# Patient Record
Sex: Female | Born: 1955
Health system: Southern US, Community
[De-identification: ages and names within clinical notes are randomized; demographics above are authoritative.]

## PROBLEM LIST (undated history)

## (undated) DIAGNOSIS — F419 Anxiety disorder, unspecified: Secondary | ICD-10-CM

## (undated) DIAGNOSIS — F32A Depression, unspecified: Secondary | ICD-10-CM

## (undated) DIAGNOSIS — G4730 Sleep apnea, unspecified: Secondary | ICD-10-CM

## (undated) DIAGNOSIS — I10 Essential (primary) hypertension: Secondary | ICD-10-CM

## (undated) DIAGNOSIS — E785 Hyperlipidemia, unspecified: Secondary | ICD-10-CM

## (undated) HISTORY — PX: APPENDECTOMY: SHX54

## (undated) HISTORY — DX: Anxiety disorder, unspecified: F41.9

## (undated) HISTORY — DX: Hyperlipidemia, unspecified: E78.5

## (undated) HISTORY — DX: Essential (primary) hypertension: I10

## (undated) HISTORY — PX: OTHER SURGICAL HISTORY: SHX169

## (undated) HISTORY — DX: Depression, unspecified: F32.A

## (undated) HISTORY — DX: Sleep apnea, unspecified: G47.30

---

## 2004-06-08 ENCOUNTER — Encounter: Admission: RE | Admit: 2004-06-08 | Discharge: 2004-06-08 | Payer: Self-pay | Admitting: *Deleted

## 2004-06-15 ENCOUNTER — Encounter: Admission: RE | Admit: 2004-06-15 | Discharge: 2004-06-15 | Payer: Self-pay | Admitting: *Deleted

## 2005-01-12 ENCOUNTER — Encounter: Admission: RE | Admit: 2005-01-12 | Discharge: 2005-01-12 | Payer: Self-pay | Admitting: *Deleted

## 2005-05-12 ENCOUNTER — Encounter: Admission: RE | Admit: 2005-05-12 | Discharge: 2005-05-12 | Payer: Self-pay | Admitting: *Deleted

## 2006-05-17 ENCOUNTER — Encounter: Admission: RE | Admit: 2006-05-17 | Discharge: 2006-05-17 | Payer: Self-pay | Admitting: *Deleted

## 2006-05-19 ENCOUNTER — Encounter: Admission: RE | Admit: 2006-05-19 | Discharge: 2006-05-19 | Payer: Self-pay | Admitting: *Deleted

## 2007-05-31 ENCOUNTER — Encounter: Admission: RE | Admit: 2007-05-31 | Discharge: 2007-05-31 | Payer: Self-pay | Admitting: *Deleted

## 2008-06-06 ENCOUNTER — Encounter: Admission: RE | Admit: 2008-06-06 | Discharge: 2008-06-06 | Payer: Self-pay | Admitting: *Deleted

## 2009-06-09 ENCOUNTER — Encounter: Admission: RE | Admit: 2009-06-09 | Discharge: 2009-06-09 | Payer: Self-pay | Admitting: *Deleted

## 2010-06-12 ENCOUNTER — Encounter: Admission: RE | Admit: 2010-06-12 | Discharge: 2010-06-12 | Payer: Self-pay | Admitting: Obstetrics & Gynecology

## 2010-12-04 ENCOUNTER — Other Ambulatory Visit: Payer: Self-pay | Admitting: *Deleted

## 2010-12-04 DIAGNOSIS — M8580 Other specified disorders of bone density and structure, unspecified site: Secondary | ICD-10-CM

## 2010-12-11 ENCOUNTER — Ambulatory Visit
Admission: RE | Admit: 2010-12-11 | Discharge: 2010-12-11 | Disposition: A | Discharge status: Home / Self Care | Payer: BC Managed Care – PPO | Source: Ambulatory Visit | Attending: *Deleted | Admitting: *Deleted

## 2010-12-11 DIAGNOSIS — M8580 Other specified disorders of bone density and structure, unspecified site: Secondary | ICD-10-CM

## 2010-12-11 DIAGNOSIS — Z780 Asymptomatic menopausal state: Secondary | ICD-10-CM

## 2011-05-09 ENCOUNTER — Emergency Department (HOSPITAL_COMMUNITY): Payer: BC Managed Care – PPO

## 2011-05-09 ENCOUNTER — Emergency Department (HOSPITAL_COMMUNITY)
Admission: EM | Admit: 2011-05-09 | Discharge: 2011-05-09 | Disposition: A | Discharge status: Home / Self Care | Payer: BC Managed Care – PPO | Attending: Emergency Medicine | Admitting: Emergency Medicine

## 2011-05-09 DIAGNOSIS — M25579 Pain in unspecified ankle and joints of unspecified foot: Secondary | ICD-10-CM | POA: Insufficient documentation

## 2011-05-09 DIAGNOSIS — M25476 Effusion, unspecified foot: Secondary | ICD-10-CM | POA: Insufficient documentation

## 2011-05-09 DIAGNOSIS — E785 Hyperlipidemia, unspecified: Secondary | ICD-10-CM | POA: Insufficient documentation

## 2011-05-09 DIAGNOSIS — Z79899 Other long term (current) drug therapy: Secondary | ICD-10-CM | POA: Insufficient documentation

## 2011-05-09 DIAGNOSIS — F329 Major depressive disorder, single episode, unspecified: Secondary | ICD-10-CM | POA: Insufficient documentation

## 2011-05-09 DIAGNOSIS — X500XXA Overexertion from strenuous movement or load, initial encounter: Secondary | ICD-10-CM | POA: Insufficient documentation

## 2011-05-09 DIAGNOSIS — M25473 Effusion, unspecified ankle: Secondary | ICD-10-CM | POA: Insufficient documentation

## 2011-05-09 DIAGNOSIS — IMO0002 Reserved for concepts with insufficient information to code with codable children: Secondary | ICD-10-CM | POA: Insufficient documentation

## 2011-05-09 DIAGNOSIS — R296 Repeated falls: Secondary | ICD-10-CM | POA: Insufficient documentation

## 2011-05-09 DIAGNOSIS — I10 Essential (primary) hypertension: Secondary | ICD-10-CM | POA: Insufficient documentation

## 2011-05-09 DIAGNOSIS — Y92009 Unspecified place in unspecified non-institutional (private) residence as the place of occurrence of the external cause: Secondary | ICD-10-CM | POA: Insufficient documentation

## 2011-05-09 DIAGNOSIS — S82899A Other fracture of unspecified lower leg, initial encounter for closed fracture: Secondary | ICD-10-CM | POA: Insufficient documentation

## 2011-05-09 DIAGNOSIS — F3289 Other specified depressive episodes: Secondary | ICD-10-CM | POA: Insufficient documentation

## 2011-05-13 ENCOUNTER — Ambulatory Visit (HOSPITAL_COMMUNITY): Payer: BC Managed Care – PPO | Attending: Orthopedic Surgery

## 2011-05-13 ENCOUNTER — Ambulatory Visit (HOSPITAL_BASED_OUTPATIENT_CLINIC_OR_DEPARTMENT_OTHER)
Admission: RE | Admit: 2011-05-13 | Discharge: 2011-05-14 | Disposition: A | Discharge status: Home / Self Care | Payer: BC Managed Care – PPO | Source: Ambulatory Visit | Attending: Orthopedic Surgery | Admitting: Orthopedic Surgery

## 2011-05-13 DIAGNOSIS — E669 Obesity, unspecified: Secondary | ICD-10-CM | POA: Insufficient documentation

## 2011-05-13 DIAGNOSIS — X58XXXA Exposure to other specified factors, initial encounter: Secondary | ICD-10-CM | POA: Insufficient documentation

## 2011-05-13 DIAGNOSIS — I10 Essential (primary) hypertension: Secondary | ICD-10-CM | POA: Insufficient documentation

## 2011-05-13 DIAGNOSIS — S82843A Displaced bimalleolar fracture of unspecified lower leg, initial encounter for closed fracture: Secondary | ICD-10-CM | POA: Insufficient documentation

## 2011-05-13 DIAGNOSIS — S82899A Other fracture of unspecified lower leg, initial encounter for closed fracture: Secondary | ICD-10-CM | POA: Insufficient documentation

## 2011-05-13 DIAGNOSIS — Y929 Unspecified place or not applicable: Secondary | ICD-10-CM | POA: Insufficient documentation

## 2011-05-13 DIAGNOSIS — F329 Major depressive disorder, single episode, unspecified: Secondary | ICD-10-CM | POA: Insufficient documentation

## 2011-05-13 DIAGNOSIS — F3289 Other specified depressive episodes: Secondary | ICD-10-CM | POA: Insufficient documentation

## 2011-05-13 DIAGNOSIS — W19XXXA Unspecified fall, initial encounter: Secondary | ICD-10-CM | POA: Insufficient documentation

## 2011-05-13 LAB — POCT I-STAT, CHEM 8
BUN: 11 mg/dL (ref 6–23)
Calcium, Ion: 1.08 mmol/L — ABNORMAL LOW (ref 1.12–1.32)
Chloride: 103 mEq/L (ref 96–112)
Creatinine, Ser: 0.8 mg/dL (ref 0.50–1.10)
Glucose, Bld: 112 mg/dL — ABNORMAL HIGH (ref 70–99)
HCT: 46 % (ref 36.0–46.0)
Hemoglobin: 15.6 g/dL — ABNORMAL HIGH (ref 12.0–15.0)
Potassium: 3.4 mEq/L — ABNORMAL LOW (ref 3.5–5.1)
Sodium: 141 mEq/L (ref 135–145)
TCO2: 27 mmol/L (ref 0–100)

## 2011-05-19 NOTE — Op Note (Signed)
NAMEDAIL, MEECE NO.:  [id number]  MEDICAL RECORD NO.:  [id number]  LOCATION:                                 FACILITY:  PHYSICIAN:  Toni Arthurs, MD        DATE OF BIRTH:  1956-03-12  DATE OF PROCEDURE:  05/13/2011 DATE OF DISCHARGE:                              OPERATIVE REPORT   PREOPERATIVE DIAGNOSIS:  Left ankle bimalleolar fracture (lateral malleolus and posterior malleolus).  POSTOPERATIVE DIAGNOSIS:  Left ankle bimalleolar fracture (lateral malleolus and posterior malleolus).  PROCEDURES: 1. Open reduction and internal fixation of left ankle bimalleolar     fracture including fixation of the posterior malleolus fracture. 2. Intraoperative interpretation of fluoroscopic images. 3. Stress examination under fluoroscopy.  SURGEON:  Toni Arthurs, MD  ANESTHESIA:  General, regional.  IV FLUIDS:  See anesthesia record.  ESTIMATED BLOOD LOSS:  Minimal.  TOURNIQUET TIME:  See anesthesia record.  COMPLICATIONS:  None apparent.  DISPOSITION:  Extubated, awake, and stable to recovery.  INDICATIONS FOR PROCEDURE:  The patient is a 55 year old female with past medical history significant for hypertension and depression.  She fell last week injuring her left ankle.  She was found in the emergency room to have a bimalleolar fracture of the left ankle and the ankle was dislocated.  She underwent closed reduction in the ER and was splinted. She presents now for operative treatment of this injury.  She understands the risks and benefits of this surgical treatment as well as the alternative treatment options and would like to proceed. Specifically, she understands the risks of bleeding, infection, nerve damage, blood clots, need for additional surgery, amputation, and death.  PROCEDURE IN DETAIL:  After preoperative consent was obtained and the correct operative site was identified, the patient was brought to the operating room and placed supine on the  operating table.  General anesthesia was induced.  Preoperative antibiotics were administered. Surgical time-out was taken.  The left lower extremity was prepped and draped in standard sterile fashion with tourniquet around the thigh.  A longitudinal incision was marked on the skin adjacent to the lateral malleolus.  The extremity was exsanguinated and the tourniquet was inflated to 250 mmHg and the laterally marked incision was made.  Sharp dissection was carried down through the skin.  Blunt dissection was carried down through the subcutaneous tissue to the level of fracture. The fracture was opened and cleaned of all periosteum and blood clots. The fracture was reduced and clamped with a tenaculum.  A 3.5-mm fully- threaded lag screw was inserted from anterior to posterior cross the fracture line.  A 7-hole one-third tubular plate was then contoured to fit the lateral malleolus and applied distally with two unicortical screws and proximally with three bicortical screws.  AP and lateral x- rays were obtained showing appropriate reduction of the fracture as well as appropriate position and length of all hardware.  A lateral view though revealed that the posterior malleolus fracture was slightly displaced.  An attempt was made to reduce this fracture posterior to the fibula using a Therapist, nutritional, but this was unsuccessful.  Ultimately it was necessary to remove the plate and screws from the  fibula.  This freed up the posterior malleolus fragment and allowed it to be reduced.  It was pinned from anterior to posterior through a stab incision taking care to protect the neurovascular bundle.  A 4-mm partially-threaded cannulated screw was then inserted and was noted to have appropriate purchase securing the posterior malleolus fragment appropriately.  The lateral malleolus fracture was then re-reduced and the previous hardware all repositioned as it had been initially.  AP, mortise, and  lateral views were obtained showing appropriate reduction of both fractures and appropriate position and length of all hardware. A mortise view was obtained on fluoroscopy.  Dorsiflexion and external rotation stress was then applied to the foot under live fluoroscopic imaging.  There was no instability noted at the syndesmosis or widening of the medial clear space.  The patient's wounds were then irrigated copiously.  Simple sutures of 0 Vicryl were used to close the subcutaneous tissue and periosteum over the plate.  Subcutaneous tissue was then further approximated with inverted simple sutures of 3-0 Monocryl, a running 3-0 Prolene suture was used to close the skin incision, the anterior incision was closed with a horizontal mattress suture of 3-0 Prolene.  Sterile dressings were applied followed by well-padded short-leg splint.  The tourniquet was released just over an hour after application of the dressings.  The patient was then awakened from anesthesia and transported to the recovery room in stable condition.  FOLLOWUP PLAN:  The patient will be nonweightbearing on her left lower extremity.  She will be observed overnight for pain control and will be discharged home tomorrow morning.     Toni Arthurs, MD     JH/MEDQ  D:  05/13/2011  T:  05/14/2011  Job:  [id number]  Electronically Signed by Toni Arthurs  on 05/19/2011 12:33:26 PM

## 2011-05-31 ENCOUNTER — Other Ambulatory Visit: Payer: Self-pay | Admitting: *Deleted

## 2011-05-31 DIAGNOSIS — Z1231 Encounter for screening mammogram for malignant neoplasm of breast: Secondary | ICD-10-CM

## 2011-07-26 ENCOUNTER — Ambulatory Visit
Admission: RE | Admit: 2011-07-26 | Discharge: 2011-07-26 | Disposition: A | Discharge status: Home / Self Care | Payer: BC Managed Care – PPO | Source: Ambulatory Visit | Attending: *Deleted | Admitting: *Deleted

## 2011-07-26 DIAGNOSIS — Z1231 Encounter for screening mammogram for malignant neoplasm of breast: Secondary | ICD-10-CM

## 2011-07-27 ENCOUNTER — Other Ambulatory Visit: Payer: Self-pay | Admitting: *Deleted

## 2011-07-27 DIAGNOSIS — R928 Other abnormal and inconclusive findings on diagnostic imaging of breast: Secondary | ICD-10-CM

## 2011-08-11 ENCOUNTER — Ambulatory Visit
Admission: RE | Admit: 2011-08-11 | Discharge: 2011-08-11 | Disposition: A | Discharge status: Home / Self Care | Payer: BC Managed Care – PPO | Source: Ambulatory Visit | Attending: *Deleted | Admitting: *Deleted

## 2011-08-11 DIAGNOSIS — R928 Other abnormal and inconclusive findings on diagnostic imaging of breast: Secondary | ICD-10-CM

## 2012-07-19 ENCOUNTER — Other Ambulatory Visit: Payer: Self-pay | Admitting: *Deleted

## 2012-07-19 DIAGNOSIS — Z1231 Encounter for screening mammogram for malignant neoplasm of breast: Secondary | ICD-10-CM

## 2012-08-01 ENCOUNTER — Ambulatory Visit
Admission: RE | Admit: 2012-08-01 | Discharge: 2012-08-01 | Disposition: A | Discharge status: Home / Self Care | Payer: BC Managed Care – PPO | Source: Ambulatory Visit | Attending: *Deleted | Admitting: *Deleted

## 2012-08-01 DIAGNOSIS — Z1231 Encounter for screening mammogram for malignant neoplasm of breast: Secondary | ICD-10-CM

## 2012-12-04 IMAGING — CR DG ANKLE COMPLETE 3+V*L*
3 series · 3 of 3 positions shown · non-contrast
Comparison: None.

CLINICAL DATA: Pain and swelling secondary to a fall today.

LEFT ANKLE COMPLETE - 3+ VIEW

[x ankle lat left]
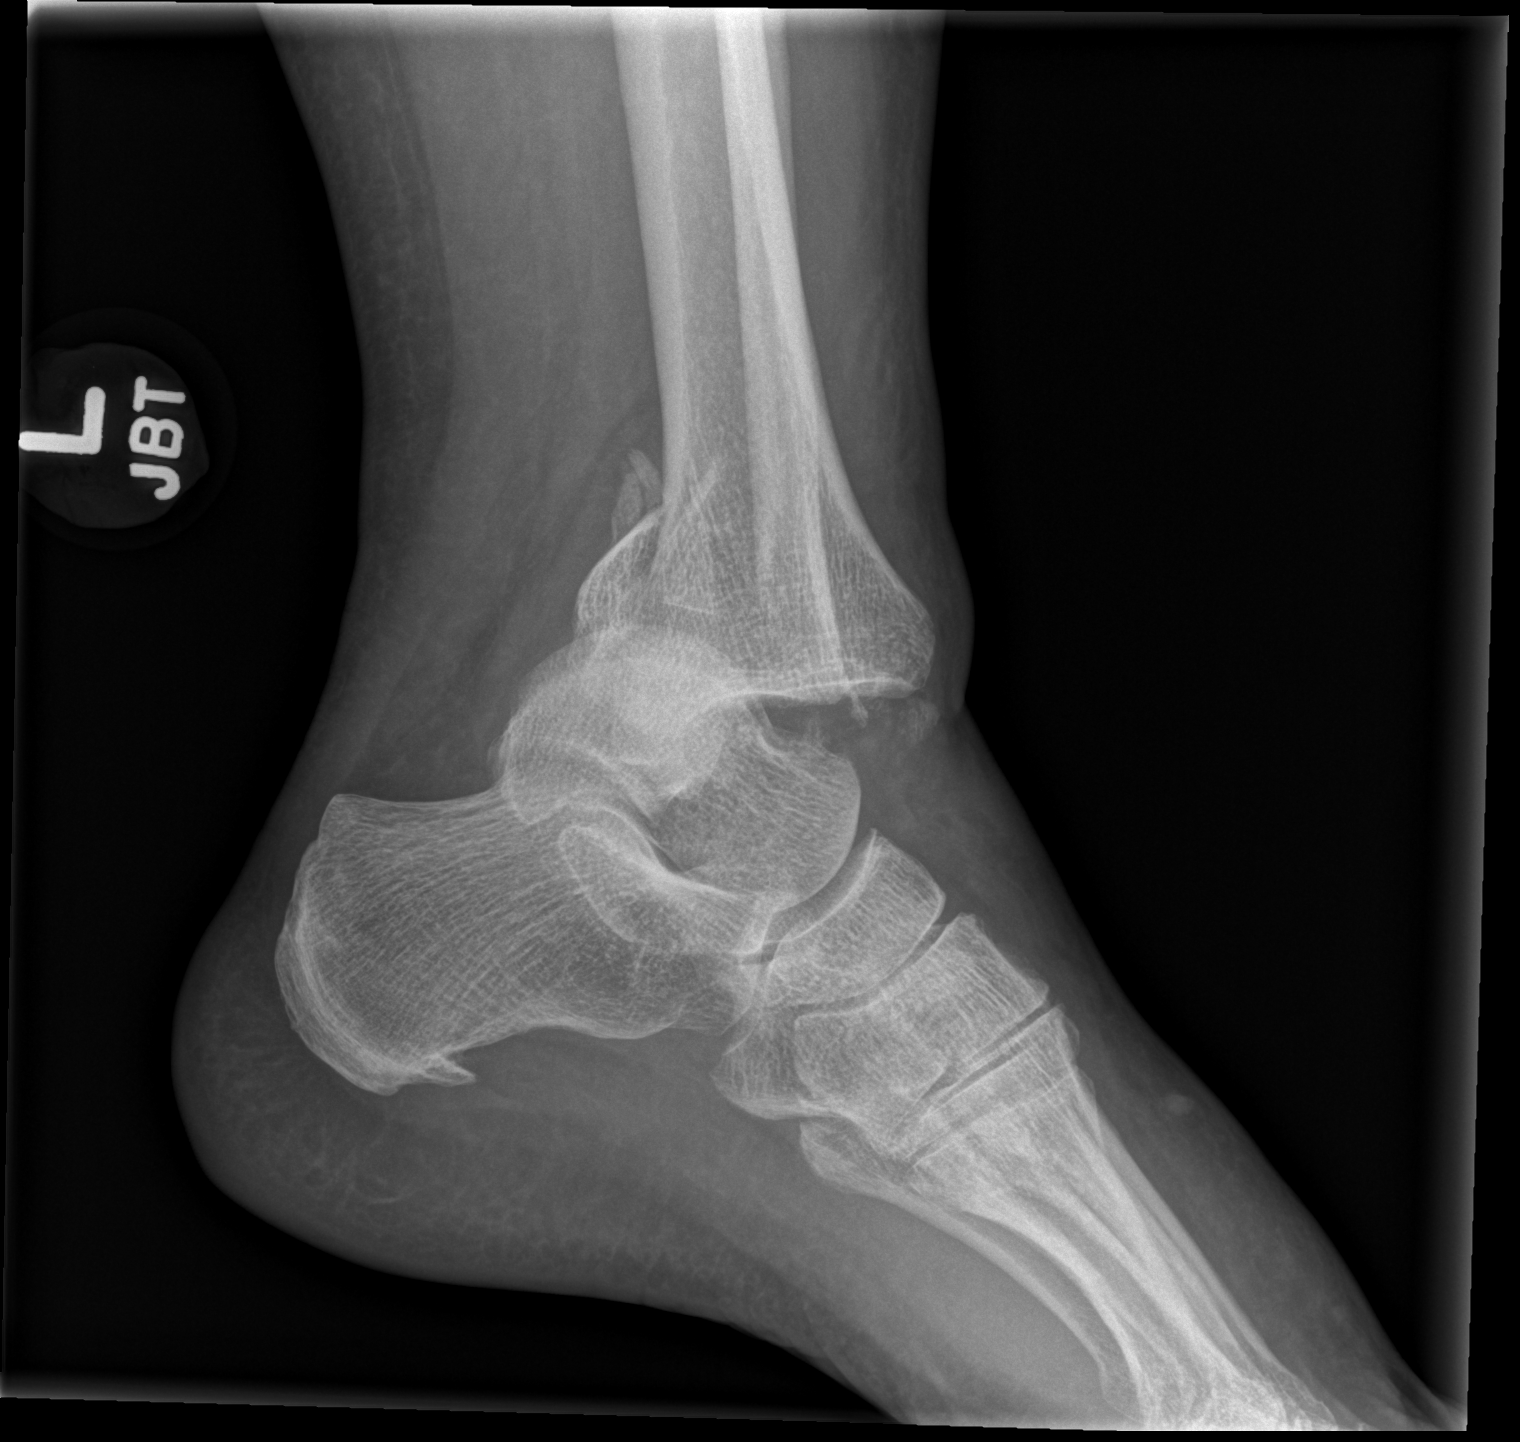

[x ankle obl left (1 of 2)]
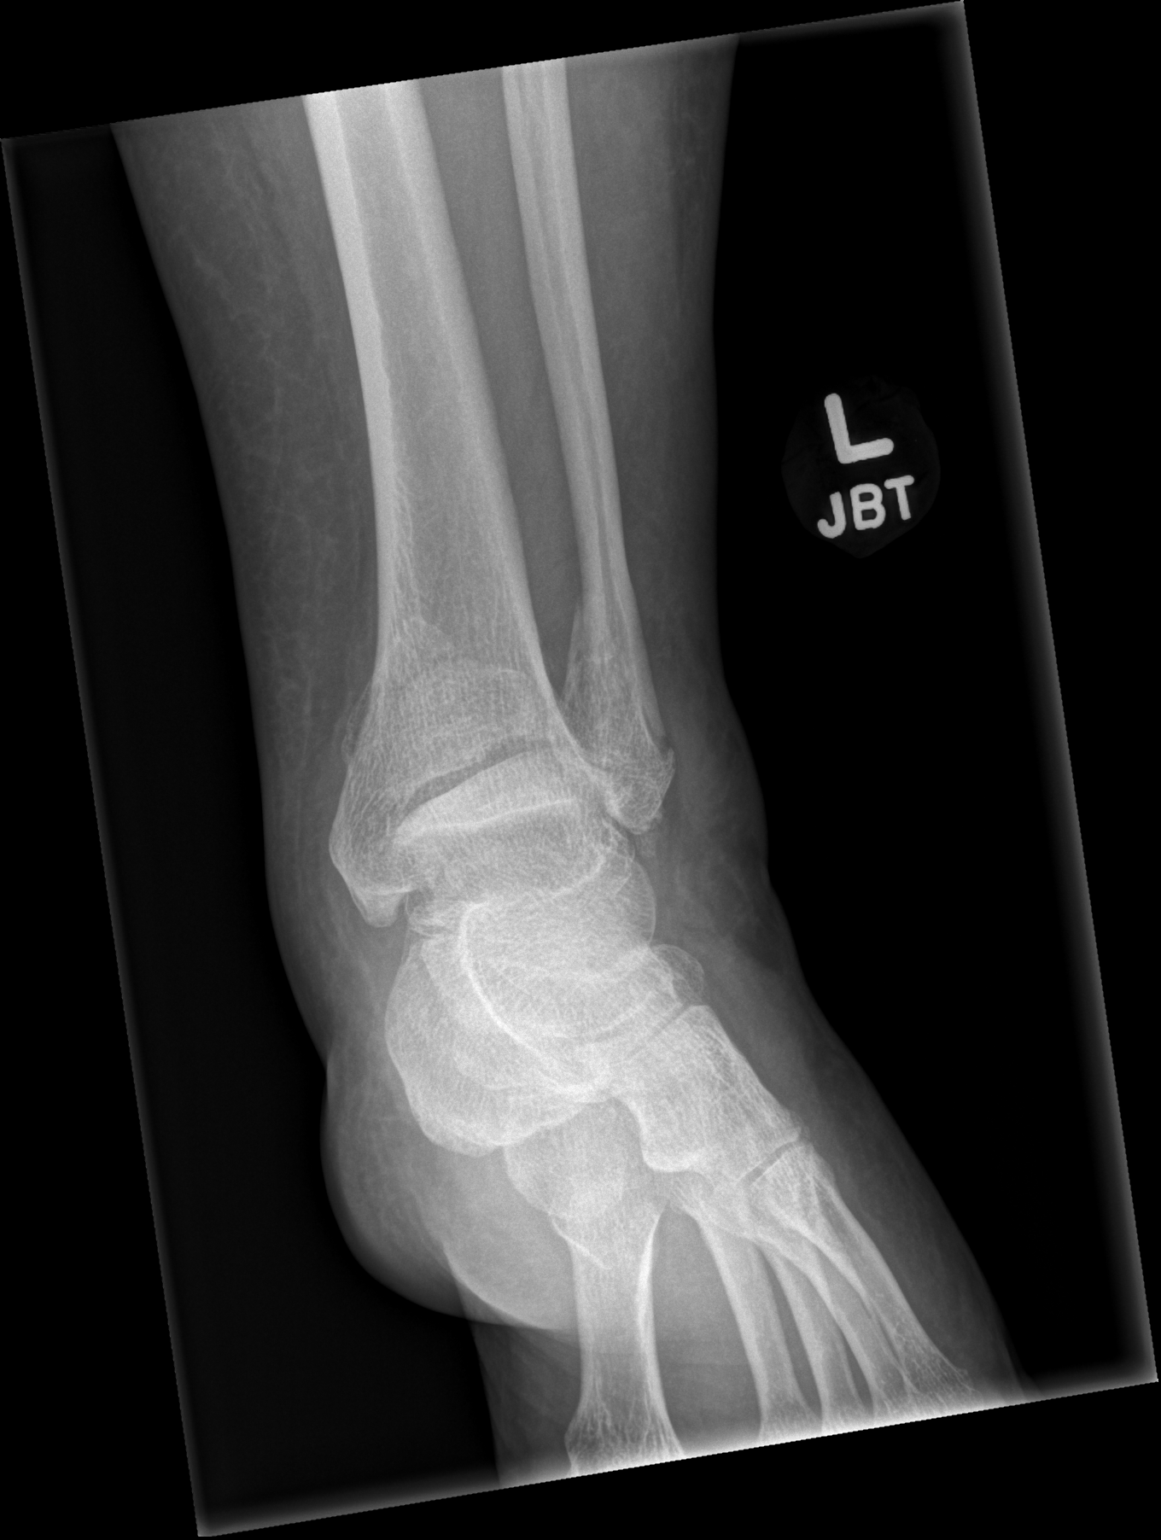

[x ankle obl left (2 of 2)]
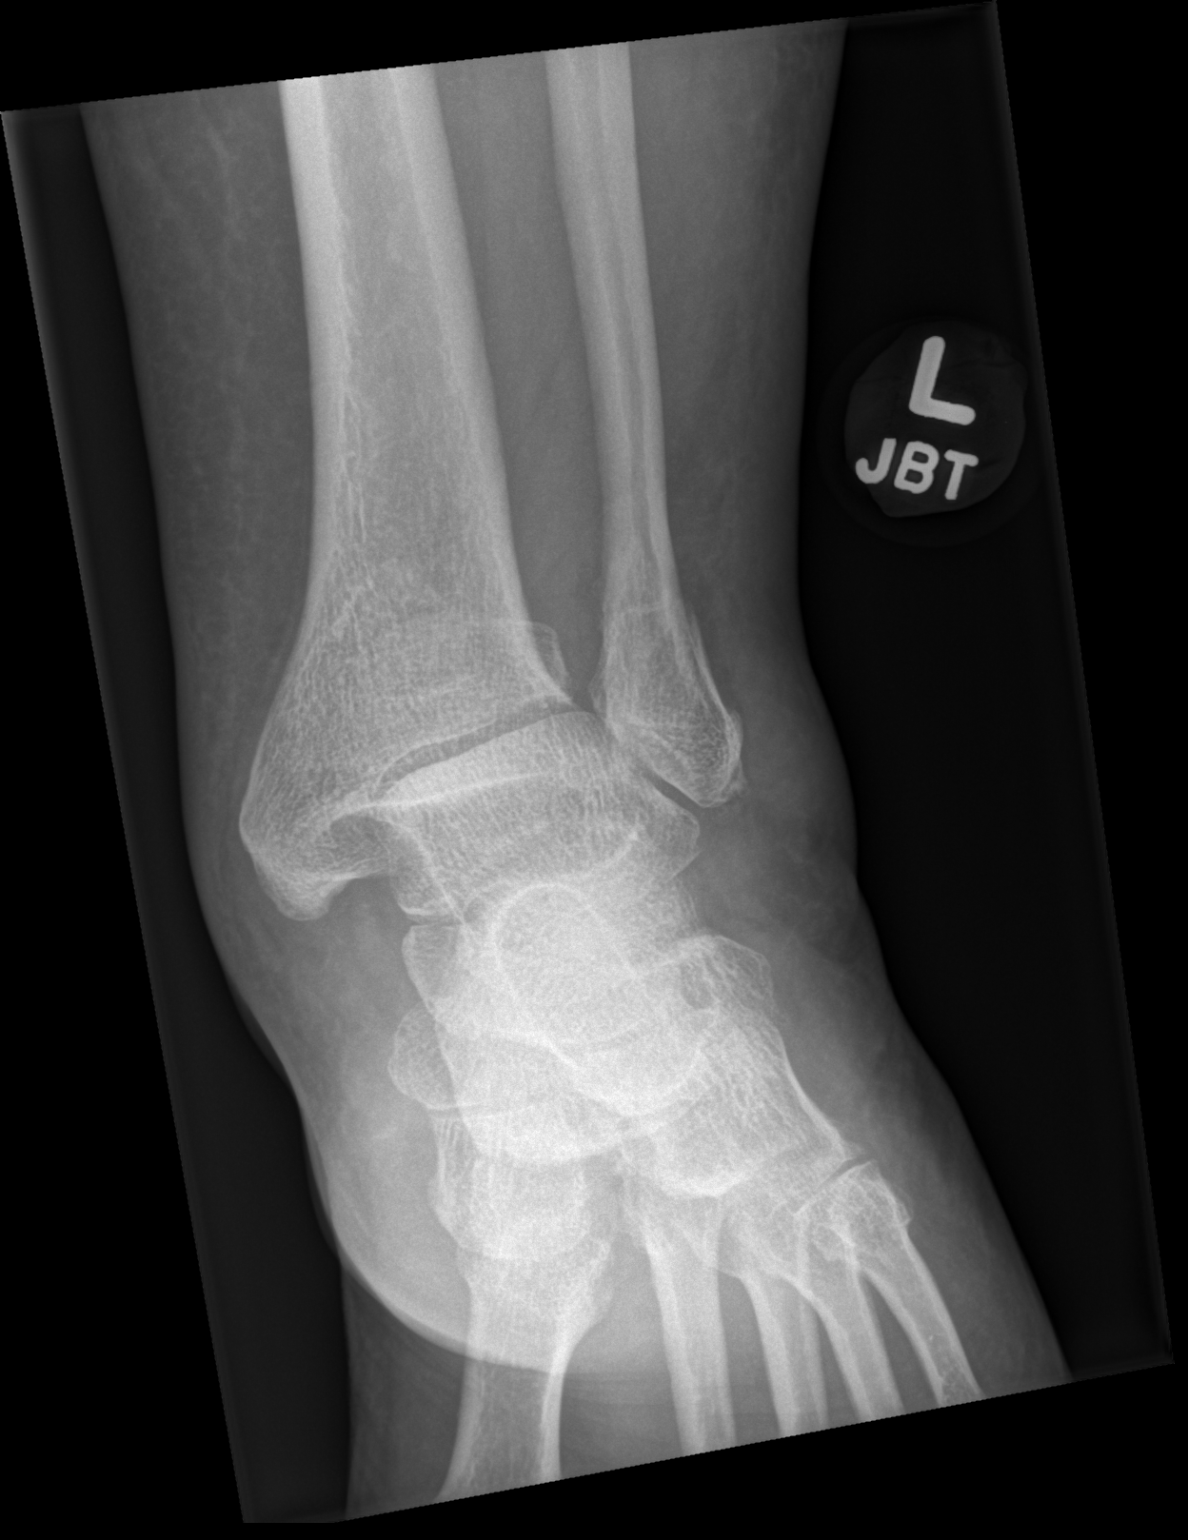

[3 of 3 positions shown; findings below may reference images not displayed]

FINDINGS: The foot is dislocated posteriorly at the ankle joint.
Displaced fractures of the posterior malleolus of the tibia and of
the distal fibula.  Medial malleolus appears to be intact.
IMPRESSION: Fracture dislocation of the left ankle.

## 2012-12-04 IMAGING — CR DG FOOT 2V*L*
2 series · 2 of 2 positions shown · non-contrast
Comparison: Ankle radiographs dated 05/09/2011

CLINICAL DATA: Pain and swelling secondary to a fall today.

LEFT FOOT - 2 VIEW

[x foot ap left]
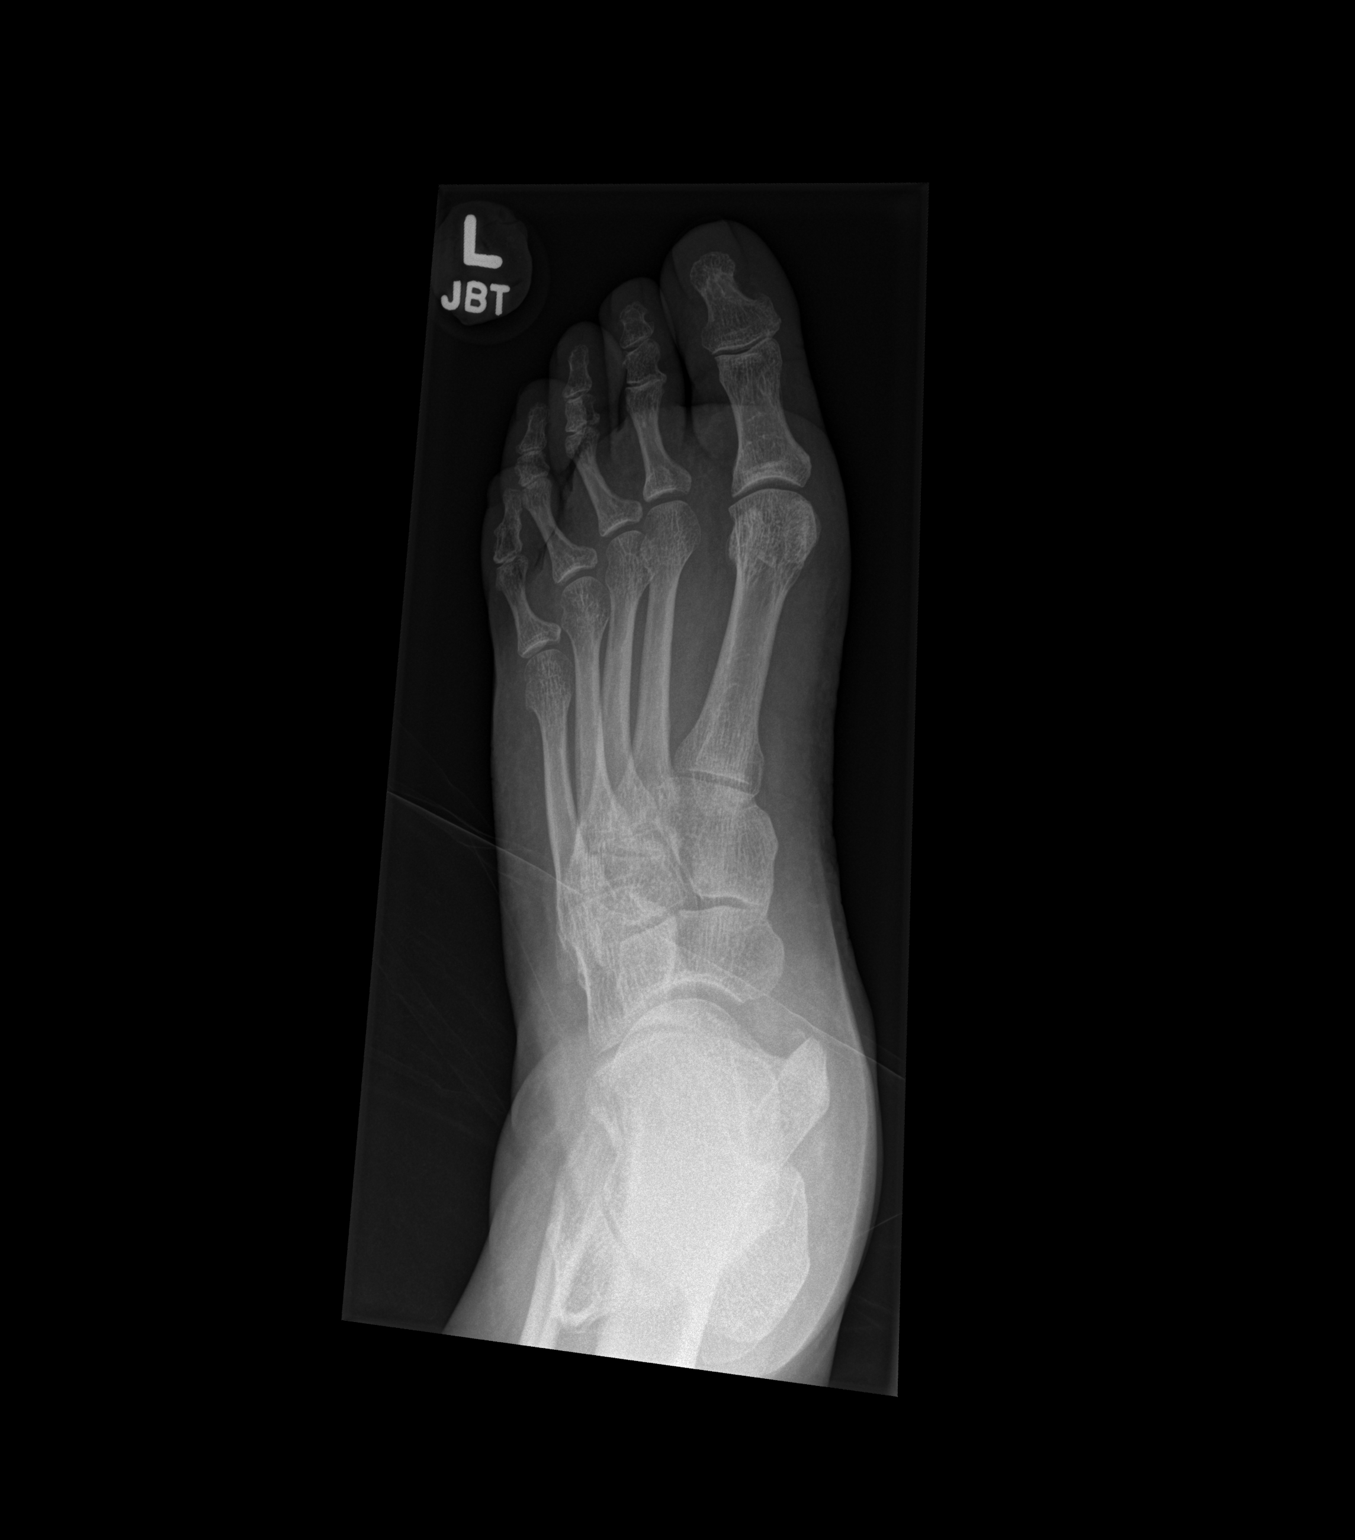

[x foot lat left]
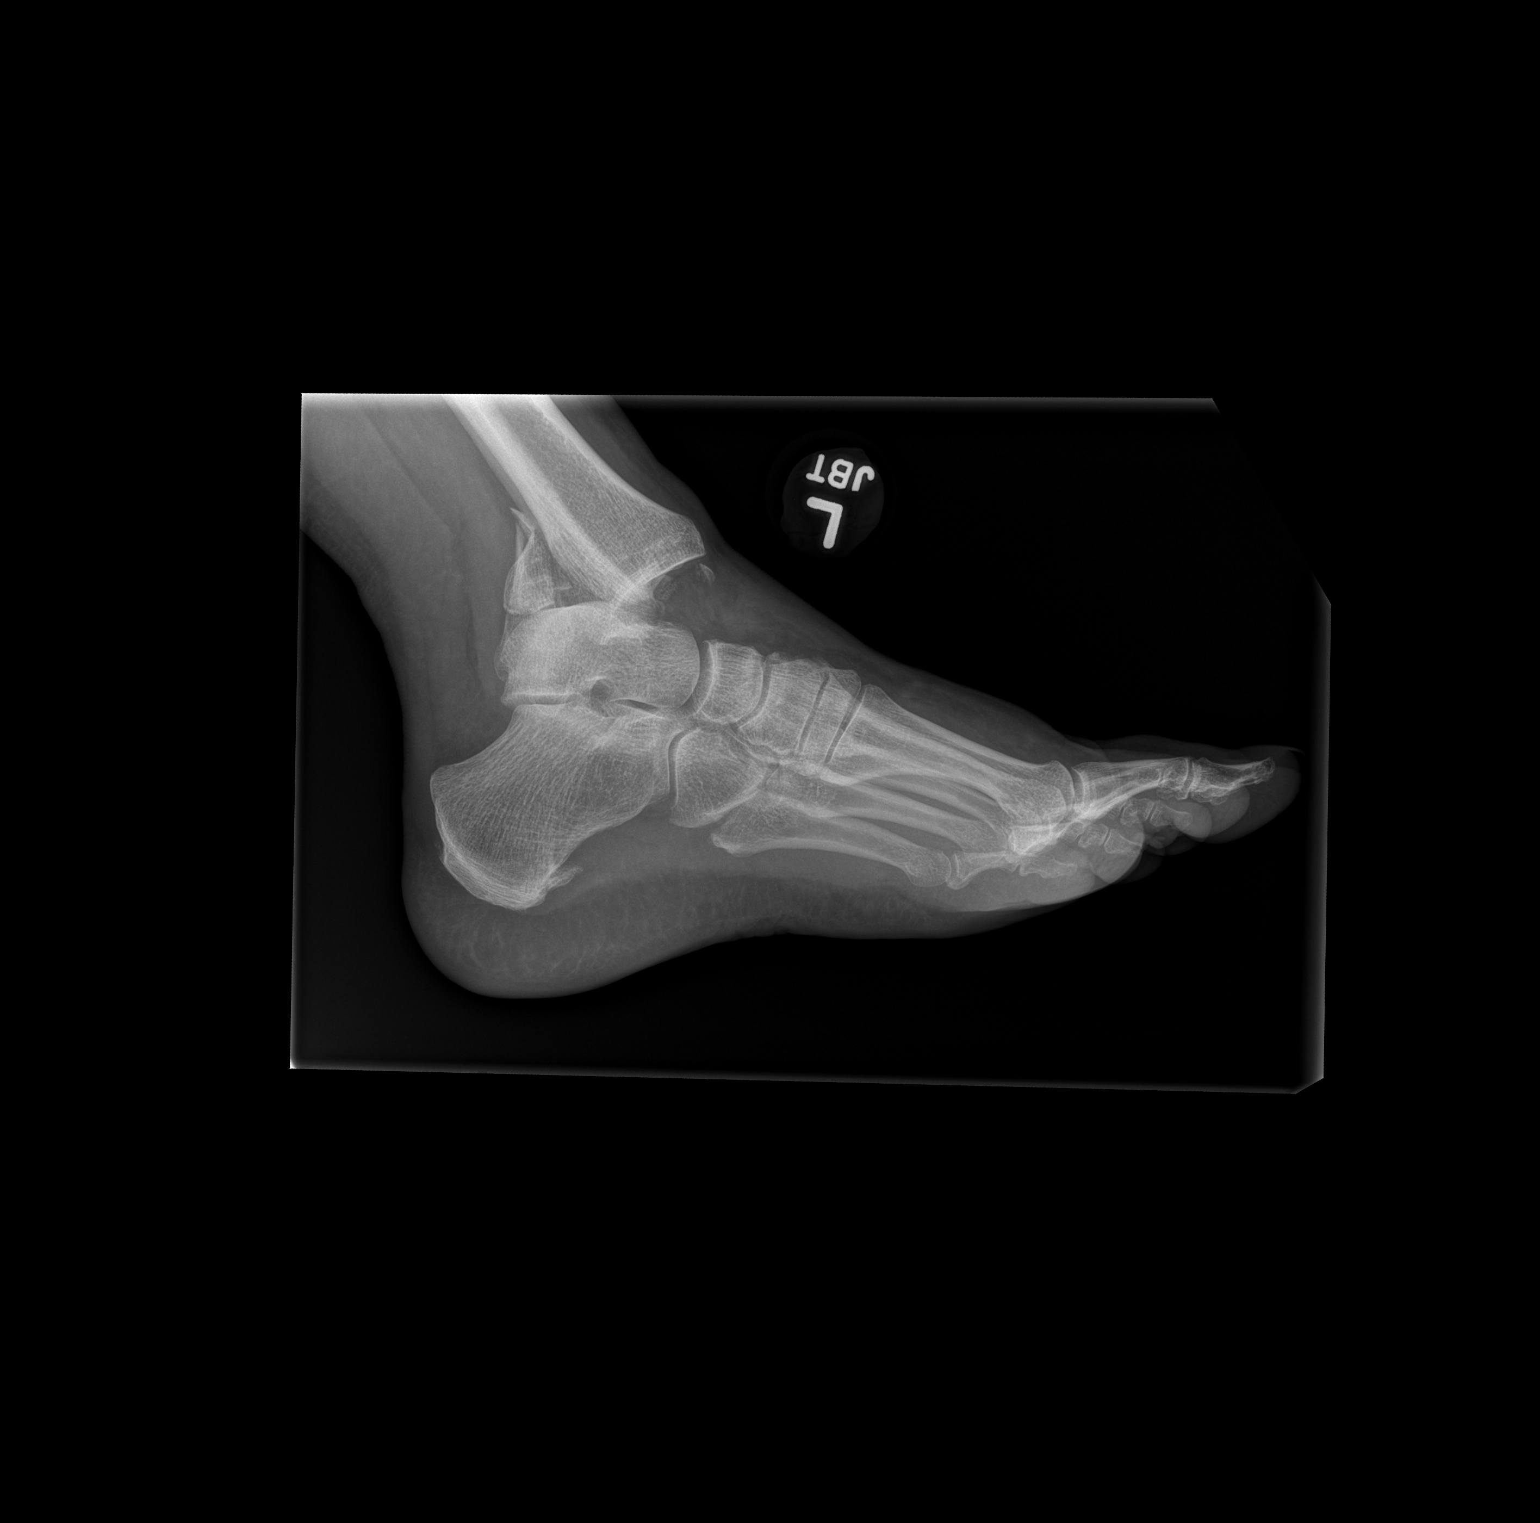

[2 of 2 positions shown; findings below may reference images not displayed]

FINDINGS: Fracture dislocation of the ankle joint involving the
posterior and lateral malleoli.  Foot is posteriorly dislocated. No
visible fracture of the bones of the foot.
IMPRESSION: Fracture dislocation of the ankle.

## 2013-05-16 ENCOUNTER — Encounter: Payer: Self-pay | Admitting: Obstetrics & Gynecology

## 2013-05-16 ENCOUNTER — Ambulatory Visit (INDEPENDENT_AMBULATORY_CARE_PROVIDER_SITE_OTHER): Payer: BC Managed Care – PPO | Admitting: Obstetrics & Gynecology

## 2013-05-16 VITALS — BP 121/74 | HR 62 | Temp 98.7°F | Ht 61.0 in | Wt 226.0 lb

## 2013-05-16 DIAGNOSIS — Z01419 Encounter for gynecological examination (general) (routine) without abnormal findings: Secondary | ICD-10-CM

## 2013-05-16 NOTE — Progress Notes (Signed)
Subjective:     Danielle Ellis is a 56 y.o. female here for a routine exam.  Current complaints: Patient is in the office for annual exam. Patient reports she is doing well.  Personal health questionnaire reviewed: no.   Gynecologic History No LMP recorded. Patient is postmenopausal. Contraception: post menopausal status Last Pap: years. Results were: normal Last mammogram: 2013. Results were: normal  Obstetric History OB History  No data available     The following portions of the patient's history were reviewed and updated as appropriate: allergies, current medications, past family history, past medical history, past social history, past surgical history and problem list.  Review of Systems Pertinent items are noted in HPI.    Objective:      General appearance: alert Breasts: normal appearance, no masses or tenderness Abdomen: soft, non-tender; bowel sounds normal; no masses,  no organomegaly Pelvic: cervix normal in appearance, external genitalia normal, no adnexal masses or tenderness, uterus normal size, shape, and consistency and vagina normal without discharge       Assessment:    Healthy female exam.    Plan:    Follow-up prn

## 2013-05-17 LAB — PAP IG, CT-NG NAA, HPV HIGH-RISK
Chlamydia Probe Amp: NEGATIVE
GC Probe Amp: NEGATIVE
HPV DNA High Risk: NOT DETECTED

## 2013-07-30 ENCOUNTER — Other Ambulatory Visit: Payer: Self-pay

## 2013-07-30 DIAGNOSIS — Z1231 Encounter for screening mammogram for malignant neoplasm of breast: Secondary | ICD-10-CM

## 2013-08-01 ENCOUNTER — Other Ambulatory Visit: Payer: Self-pay | Admitting: *Deleted

## 2013-08-01 DIAGNOSIS — Z780 Asymptomatic menopausal state: Secondary | ICD-10-CM

## 2013-09-04 ENCOUNTER — Ambulatory Visit
Admission: RE | Admit: 2013-09-04 | Discharge: 2013-09-04 | Disposition: A | Discharge status: Home / Self Care | Payer: BC Managed Care – PPO | Source: Ambulatory Visit | Attending: *Deleted | Admitting: *Deleted

## 2013-09-04 ENCOUNTER — Ambulatory Visit: Payer: BC Managed Care – PPO

## 2013-09-04 ENCOUNTER — Ambulatory Visit
Admission: RE | Admit: 2013-09-04 | Discharge: 2013-09-04 | Disposition: A | Discharge status: Home / Self Care | Payer: BC Managed Care – PPO | Source: Ambulatory Visit

## 2013-09-04 DIAGNOSIS — Z1231 Encounter for screening mammogram for malignant neoplasm of breast: Secondary | ICD-10-CM

## 2013-09-04 DIAGNOSIS — Z780 Asymptomatic menopausal state: Secondary | ICD-10-CM

## 2014-07-15 ENCOUNTER — Other Ambulatory Visit: Payer: Self-pay

## 2014-07-15 DIAGNOSIS — Z1231 Encounter for screening mammogram for malignant neoplasm of breast: Secondary | ICD-10-CM

## 2014-08-02 ENCOUNTER — Ambulatory Visit (INDEPENDENT_AMBULATORY_CARE_PROVIDER_SITE_OTHER): Payer: BC Managed Care – PPO | Admitting: Obstetrics & Gynecology

## 2014-08-02 ENCOUNTER — Encounter: Payer: Self-pay | Admitting: Obstetrics & Gynecology

## 2014-08-02 VITALS — BP 148/83 | HR 80 | Temp 98.3°F | Ht 61.0 in | Wt 223.0 lb

## 2014-08-02 DIAGNOSIS — Z01419 Encounter for gynecological examination (general) (routine) without abnormal findings: Secondary | ICD-10-CM

## 2014-08-02 NOTE — Patient Instructions (Signed)
Bone Health Our bones do many things. They provide structure, protect organs, anchor muscles, and store calcium. Adequate calcium in your diet and weight-bearing physical activity help build strong bones, improve bone amounts, and may reduce the risk of weakening of bones (osteoporosis) later in life. PEAK BONE MASS By age 60, the average woman has acquired most of her skeletal bone mass. A large decline occurs in older adults which increases the risk of osteoporosis. In women this occurs around the time of menopause. It is important for young girls to reach their peak bone mass in order to maintain bone health throughout life. A person with high bone mass as a young adult will be more likely to have a higher bone mass later in life. Not enough calcium consumption and physical activity early on could result in a failure to achieve optimum bone mass in adulthood. OSTEOPOROSIS Osteoporosis is a disease of the bones. It is defined as low bone mass with deterioration of bone structure. Osteoporosis leads to an increase risk of fractures with falls. These fractures commonly happen in the wrist, hip, and spine. While men and women of all ages and background can develop osteoporosis, some of the risk factors for osteoporosis are:  Female.  White.  Postmenopausal.  Older adults.  Small in body size.  Eating a diet low in calcium.  Physically inactive.  Smoking.  Use of some medications.  Family history. CALCIUM Calcium is a mineral needed by the body for healthy bones, teeth, and proper function of the heart, muscles, and nerves. The body cannot produce calcium so it must be absorbed through food. Good sources of calcium include:  Dairy products (low fat or nonfat milk, cheese, and yogurt).  Dark green leafy vegetables (bok choy and broccoli).  Calcium fortified foods (orange juice, cereal, bread, soy beverages, and tofu products).  Nuts (almonds). Recommended amounts of calcium vary  for individuals. RECOMMENDED CALCIUM INTAKES Age and Amount in mg per day  Children 1 to 3 years / 700 mg  Children 4 to 8 years / 1,000 mg  Children 9 to 13 years / 1,300 mg  Teens 14 to 18 years / 1,300 mg  Adults 19 to 50 years / 1,000 mg  Adult women 51 to 70 years / 1,200 mg  Adults 71 years and older / 1,200 mg  Pregnant and breastfeeding teens / 1,300 mg  Pregnant and breastfeeding adults / 1,000 mg Vitamin D also plays an important role in healthy bone development. Vitamin D helps in the absorption of calcium. WEIGHT-BEARING PHYSICAL ACTIVITY Regular physical activity has many positive health benefits. Benefits include strong bones. Weight-bearing physical activity early in life is important in reaching peak bone mass. Weight-bearing physical activities cause muscles and bones to work against gravity. Some examples of weight bearing physical activities include:  Walking, jogging, or running.  Field Hockey.  Jumping rope.  Dancing.  Soccer.  Tennis or Racquetball.  Stair climbing.  Basketball.  Hiking.  Weight lifting.  Aerobic fitness classes. Including weight-bearing physical activity into an exercise plan is a great way to keep bones healthy. Adults: Engage in at least 30 minutes of moderate physical activity on most, preferably all, days of the week. Children: Engage in at least 60 minutes of moderate physical activity on most, preferably all, days of the week. FOR MORE INFORMATION [place] Department of Agriculture, Center for Nutrition Policy and Promotion: www.cnpp.usda.gov National Osteoporosis Foundation: www.nof.org Document Released: 10/23/2003 Document Revised: 11/27/2012 Document Reviewed: 01/22/2009 ExitCare Patient Information   2015 [employer]. This information is not intended to replace advice given to you by your health care provider. Make sure you discuss any questions you have with your health care provider. Bone Health Our  bones do many things. They provide structure, protect organs, anchor muscles, and store calcium. Adequate calcium in your diet and weight-bearing physical activity help build strong bones, improve bone amounts, and may reduce the risk of weakening of bones (osteoporosis) later in life. PEAK BONE MASS By age 60, the average woman has acquired most of her skeletal bone mass. A large decline occurs in older adults which increases the risk of osteoporosis. In women this occurs around the time of menopause. It is important for young girls to reach their peak bone mass in order to maintain bone health throughout life. A person with high bone mass as a young adult will be more likely to have a higher bone mass later in life. Not enough calcium consumption and physical activity early on could result in a failure to achieve optimum bone mass in adulthood. OSTEOPOROSIS Osteoporosis is a disease of the bones. It is defined as low bone mass with deterioration of bone structure. Osteoporosis leads to an increase risk of fractures with falls. These fractures commonly happen in the wrist, hip, and spine. While men and women of all ages and background can develop osteoporosis, some of the risk factors for osteoporosis are:  Female.  White.  Postmenopausal.  Older adults.  Small in body size.  Eating a diet low in calcium.  Physically inactive.  Smoking.  Use of some medications.  Family history. CALCIUM Calcium is a mineral needed by the body for healthy bones, teeth, and proper function of the heart, muscles, and nerves. The body cannot produce calcium so it must be absorbed through food. Good sources of calcium include:  Dairy products (low fat or nonfat milk, cheese, and yogurt).  Dark green leafy vegetables (bok choy and broccoli).  Calcium fortified foods (orange juice, cereal, bread, soy beverages, and tofu products).  Nuts (almonds). Recommended amounts of calcium vary for  individuals. RECOMMENDED CALCIUM INTAKES Age and Amount in mg per day  Children 1 to 3 years / 700 mg  Children 4 to 8 years / 1,000 mg  Children 9 to 13 years / 1,300 mg  Teens 14 to 18 years / 1,300 mg  Adults 19 to 50 years / 1,000 mg  Adult women 51 to 70 years / 1,200 mg  Adults 71 years and older / 1,200 mg  Pregnant and breastfeeding teens / 1,300 mg  Pregnant and breastfeeding adults / 1,000 mg Vitamin D also plays an important role in healthy bone development. Vitamin D helps in the absorption of calcium. WEIGHT-BEARING PHYSICAL ACTIVITY Regular physical activity has many positive health benefits. Benefits include strong bones. Weight-bearing physical activity early in life is important in reaching peak bone mass. Weight-bearing physical activities cause muscles and bones to work against gravity. Some examples of weight bearing physical activities include:  Walking, jogging, or running.  Field Hockey.  Jumping rope.  Dancing.  Soccer.  Tennis or Racquetball.  Stair climbing.  Basketball.  Hiking.  Weight lifting.  Aerobic fitness classes. Including weight-bearing physical activity into an exercise plan is a great way to keep bones healthy. Adults: Engage in at least 30 minutes of moderate physical activity on most, preferably all, days of the week. Children: Engage in at least 60 minutes of moderate physical activity on most, preferably all, days of   the week. FOR MORE INFORMATION [place] Department of Agriculture, Center for Nutrition Policy and Promotion: www.cnpp.usda.gov National Osteoporosis Foundation: www.nof.org Document Released: 10/23/2003 Document Revised: 11/27/2012 Document Reviewed: 01/22/2009 ExitCare Patient Information 2015 [employer]. This information is not intended to replace advice given to you by your health care provider. Make sure you discuss any questions you have with your health care provider.  

## 2014-08-02 NOTE — Progress Notes (Signed)
Subjective:     Danielle Ellis is a 58 y.o. female here for a routine exam.    Personal health questionnaire:  Is patient Ashkenazi Jewish, have a family history of breast and/or ovarian cancer: yes Is there a family history of uterine cancer diagnosed at age < 3750, gastrointestinal cancer, urinary tract cancer, family member who is a Personnel officerLynch syndrome-associated carrier: no Is the patient overweight and hypertensive, family history of diabetes, personal history of gestational diabetes or PCOS: yes Is patient over 7455, have PCOS,  family history of premature CHD under age 60, diabetes, smoke, have hypertension or peripheral artery disease:  yes At any time, has a partner hit, kicked or otherwise hurt or frightened you?: no Over the past 2 weeks, have you felt down, depressed or hopeless?: no Over the past 2 weeks, have you felt little interest or pleasure in doing things?:no   Gynecologic History No LMP recorded. Patient is postmenopausal.  Last Pap: 10/14. Results were: normal Last mammogram: 12/13. Results were: normal  Obstetric History OB History  No data available    Past Medical History  Diagnosis Date  . Anxiety   . Hypertension   . Sleep apnea     Past Surgical History  Procedure Laterality Date  . L ankle surgery    . Appendectomy      Current outpatient prescriptions: calcium gluconate 500 MG tablet, Take 500 mg by mouth daily., Disp: , Rfl: ;  escitalopram (LEXAPRO) 20 MG tablet, Take 20 mg by mouth daily., Disp: , Rfl: ;  fenofibrate 160 MG tablet, Take 160 mg by mouth daily., Disp: , Rfl: ;  fish oil-omega-3 fatty acids 1000 MG capsule, Take 2 g by mouth daily., Disp: , Rfl:  lisinopril-hydrochlorothiazide (PRINZIDE,ZESTORETIC) 20-25 MG per tablet, Take 1 tablet by mouth daily., Disp: , Rfl: ;  Multiple Vitamin (MULTIVITAMIN) capsule, Take 1 capsule by mouth daily., Disp: , Rfl: ;  PRAVASTATIN SODIUM PO, Take by mouth., Disp: , Rfl: ;  Vitamin D, Ergocalciferol, (DRISDOL)  50000 UNITS CAPS capsule, Take 50,000 Units by mouth., Disp: , Rfl:  ezetimibe-simvastatin (VYTORIN) 10-40 MG per tablet, Take 1 tablet by mouth at bedtime., Disp: , Rfl:  No Known Allergies  History  Substance Use Topics  . Smoking status: Never Smoker   . Smokeless tobacco: Not on file  . Alcohol Use: No    Family History  Problem Relation Age of Onset  . Diabetes Mother   . Kidney disease Mother   . Cancer Mother       Review of Systems  Constitutional: negative for fatigue and weight loss Respiratory: negative for cough and wheezing Cardiovascular: negative for chest pain, fatigue and palpitations Gastrointestinal: negative for abdominal pain and change in bowel habits Musculoskeletal:negative for myalgias Neurological: negative for gait problems and tremors Behavioral/Psych: negative for abusive relationship, depression Endocrine: negative for temperature intolerance   Genitourinary:negative for abnormal menstrual periods, genital lesions, hot flashes, sexual problems and vaginal discharge Integument/breast: negative for breast lump, breast tenderness, nipple discharge and skin lesion(s)    Objective:       BP 148/83 mmHg  Pulse 80  Temp(Src) 98.3 F (36.8 C)  Ht 5\' 1"  (1.549 m)  Wt 101.152 kg (223 lb)  BMI 42.16 kg/m2 General:   alert  Skin:   no rash or abnormalities  Lungs:   clear to auscultation bilaterally  Heart:   regular rate and rhythm, S1, S2 normal, no murmur, click, rub or gallop  Breasts:   normal without  suspicious masses, skin or nipple changes or axillary nodes  Abdomen:  normal findings: no organomegaly, soft, non-tender and no hernia  Pelvis:  External genitalia: normal general appearance Urinary system: urethral meatus normal and bladder without fullness, nontender Vaginal: normal without tenderness, induration or masses Cervix: normal appearance Adnexa: normal bimanual exam Uterus: anteverted and non-tender, normal size   Lab  Review  Labs reviewed no Radiologic studies reviewed yes     Assessment:    Healthy female exam.    Plan:    Education reviewed: calcium supplements, low fat, low cholesterol diet, weight bearing exercise and materials ZO:[id number]:addi.   Meds ordered this encounter  Medications  . PRAVASTATIN SODIUM PO    Sig: Take by mouth.   Possible management options include: sex therapist Follow up as needed.

## 2014-08-13 ENCOUNTER — Encounter: Payer: Self-pay | Admitting: Obstetrics & Gynecology

## 2014-09-05 ENCOUNTER — Ambulatory Visit
Admission: RE | Admit: 2014-09-05 | Discharge: 2014-09-05 | Disposition: A | Discharge status: Home / Self Care | Payer: BLUE CROSS/BLUE SHIELD | Source: Ambulatory Visit

## 2014-09-05 ENCOUNTER — Ambulatory Visit: Payer: BC Managed Care – PPO | Admitting: Obstetrics & Gynecology

## 2014-09-05 DIAGNOSIS — Z1231 Encounter for screening mammogram for malignant neoplasm of breast: Secondary | ICD-10-CM

## 2015-07-17 ENCOUNTER — Other Ambulatory Visit: Payer: Self-pay

## 2015-07-17 DIAGNOSIS — Z1231 Encounter for screening mammogram for malignant neoplasm of breast: Secondary | ICD-10-CM

## 2015-09-08 ENCOUNTER — Ambulatory Visit
Admission: RE | Admit: 2015-09-08 | Discharge: 2015-09-08 | Disposition: A | Discharge status: Home / Self Care | Payer: BLUE CROSS/BLUE SHIELD | Source: Ambulatory Visit

## 2015-09-08 DIAGNOSIS — Z1231 Encounter for screening mammogram for malignant neoplasm of breast: Secondary | ICD-10-CM

## 2016-09-21 ENCOUNTER — Other Ambulatory Visit: Payer: Self-pay | Admitting: Obstetrics & Gynecology

## 2016-09-21 DIAGNOSIS — Z1231 Encounter for screening mammogram for malignant neoplasm of breast: Secondary | ICD-10-CM

## 2016-10-20 ENCOUNTER — Ambulatory Visit: Payer: BLUE CROSS/BLUE SHIELD

## 2016-10-27 ENCOUNTER — Ambulatory Visit
Admission: RE | Admit: 2016-10-27 | Discharge: 2016-10-27 | Disposition: A | Discharge status: Home / Self Care | Payer: BLUE CROSS/BLUE SHIELD | Source: Ambulatory Visit | Attending: Obstetrics & Gynecology | Admitting: Obstetrics & Gynecology

## 2016-10-27 DIAGNOSIS — Z1231 Encounter for screening mammogram for malignant neoplasm of breast: Secondary | ICD-10-CM

## 2016-11-03 ENCOUNTER — Ambulatory Visit: Payer: BLUE CROSS/BLUE SHIELD

## 2017-12-27 ENCOUNTER — Other Ambulatory Visit: Payer: Self-pay | Admitting: *Deleted

## 2017-12-27 DIAGNOSIS — Z1231 Encounter for screening mammogram for malignant neoplasm of breast: Secondary | ICD-10-CM

## 2017-12-28 ENCOUNTER — Ambulatory Visit
Admission: RE | Admit: 2017-12-28 | Discharge: 2017-12-28 | Disposition: A | Discharge status: Home / Self Care | Payer: BLUE CROSS/BLUE SHIELD | Source: Ambulatory Visit | Attending: *Deleted | Admitting: *Deleted

## 2017-12-28 DIAGNOSIS — Z1231 Encounter for screening mammogram for malignant neoplasm of breast: Secondary | ICD-10-CM

## 2018-02-07 ENCOUNTER — Ambulatory Visit: Payer: BLUE CROSS/BLUE SHIELD

## 2018-05-17 DIAGNOSIS — M65311 Trigger thumb, right thumb: Secondary | ICD-10-CM | POA: Insufficient documentation

## 2018-06-15 ENCOUNTER — Other Ambulatory Visit: Payer: Self-pay | Admitting: *Deleted

## 2018-06-15 DIAGNOSIS — M8000XA Age-related osteoporosis with current pathological fracture, unspecified site, initial encounter for fracture: Secondary | ICD-10-CM

## 2018-06-30 ENCOUNTER — Ambulatory Visit
Admission: RE | Admit: 2018-06-30 | Discharge: 2018-06-30 | Disposition: A | Discharge status: Home / Self Care | Payer: BLUE CROSS/BLUE SHIELD | Source: Ambulatory Visit | Attending: *Deleted | Admitting: *Deleted

## 2018-06-30 DIAGNOSIS — M8000XA Age-related osteoporosis with current pathological fracture, unspecified site, initial encounter for fracture: Secondary | ICD-10-CM

## 2019-02-21 ENCOUNTER — Other Ambulatory Visit: Payer: Self-pay | Admitting: Obstetrics & Gynecology

## 2019-02-21 DIAGNOSIS — Z1231 Encounter for screening mammogram for malignant neoplasm of breast: Secondary | ICD-10-CM

## 2019-03-15 ENCOUNTER — Other Ambulatory Visit: Payer: Self-pay

## 2019-03-16 ENCOUNTER — Ambulatory Visit (INDEPENDENT_AMBULATORY_CARE_PROVIDER_SITE_OTHER): Payer: Medicare Other | Admitting: Physician Assistant

## 2019-03-16 ENCOUNTER — Encounter: Payer: Self-pay | Admitting: Physician Assistant

## 2019-03-16 VITALS — BP 136/74 | HR 84 | Temp 96.8°F | Ht 61.0 in | Wt 203.8 lb

## 2019-03-16 DIAGNOSIS — F320 Major depressive disorder, single episode, mild: Secondary | ICD-10-CM | POA: Diagnosis not present

## 2019-03-16 DIAGNOSIS — I10 Essential (primary) hypertension: Secondary | ICD-10-CM

## 2019-03-16 DIAGNOSIS — R7302 Impaired glucose tolerance (oral): Secondary | ICD-10-CM | POA: Diagnosis not present

## 2019-03-16 DIAGNOSIS — E782 Mixed hyperlipidemia: Secondary | ICD-10-CM | POA: Diagnosis not present

## 2019-03-16 NOTE — Progress Notes (Signed)
  Subjective:     Patient ID: Danielle Ellis, female   DOB: 01/12/1956, 63 y.o.   MRN: [id number]  HPI Pt here to establish care Prev pt of Dr Melina Copa Sig hx of depression, HTN, hyperlipid, and pre diabetes She has had a recent cough due to her BP med Dr Melina Copa changed to a valsartan combination but it has not arrived at the pharmacy yet She has had sig elevated lipids but has been unable to tolerate multiple meds due to the SE of joint pain and fatigue  Review of Systems  Constitutional: Negative.   Respiratory: Negative.   Cardiovascular: Negative.   Gastrointestinal: Negative.   Psychiatric/Behavioral: Negative.        Objective:   Physical Exam Vitals signs and nursing note reviewed.  Constitutional:      General: She is not in acute distress.    Appearance: Normal appearance. She is not ill-appearing.  Neck:     Vascular: No carotid bruit.  Cardiovascular:     Rate and Rhythm: Normal rate and regular rhythm.     Pulses: Normal pulses.     Heart sounds: Normal heart sounds.  Pulmonary:     Effort: Pulmonary effort is normal.     Breath sounds: Normal breath sounds.  Musculoskeletal:     Right lower leg: No edema.     Left lower leg: No edema.     Comments: No ulcerations or lesions to the lower ext bilat  Lymphadenopathy:     Cervical: No cervical adenopathy.  Neurological:     Mental Status: She is alert.   Pt brings recent labs- Reviewed today Sig elevated in lipids and elevation of LFT noted but A1C good Results scanned      Assessment:     1. Hypertension, unspecified type   2. Mixed hyperlipidemia   3. Glucose intolerance (impaired glucose tolerance)   4. Depression, major, single episode, mild ([place])        Plan:     HTN- due to cough pt is going to change to the Valsartan/HCTZ combination once in comes to pharmacy Recheck in ~ 1 month for HTN She has tried multiple meds for her sig elevated lipids but has been unable to tolerate any due to  SE Will have her continue with the fish ol at this time Last A1C was very good so continue with diet and exercise as well as current dose of Glucophage Pt was prev on Lexapro 20mg  but has done well since starting back on the 5 mg dose so will not change at this time

## 2019-03-16 NOTE — Patient Instructions (Signed)
Diabetes Mellitus and Exercise Exercising regularly is important for your overall health, especially when you have diabetes (diabetes mellitus). Exercising is not only about losing weight. It has many other health benefits, such as increasing muscle strength and bone density and reducing body fat and stress. This leads to improved fitness, flexibility, and endurance, all of which result in better overall health. Exercise has additional benefits for people with diabetes, including:  Reducing appetite.  Helping to lower and control blood glucose.  Lowering blood pressure.  Helping to control amounts of fatty substances (lipids) in the blood, such as cholesterol and triglycerides.  Helping the body to respond better to insulin (improving insulin sensitivity).  Reducing how much insulin the body needs.  Decreasing the risk for heart disease by: ? Lowering cholesterol and triglyceride levels. ? Increasing the levels of good cholesterol. ? Lowering blood glucose levels. What is my activity plan? Your health care provider or certified diabetes educator can help you make a plan for the type and frequency of exercise (activity plan) that works for you. Make sure that you:  Do at least 150 minutes of moderate-intensity or vigorous-intensity exercise each week. This could be brisk walking, biking, or water aerobics. ? Do stretching and strength exercises, such as yoga or weightlifting, at least 2 times a week. ? Spread out your activity over at least 3 days of the week.  Get some form of physical activity every day. ? Do not go more than 2 days in a row without some kind of physical activity. ? Avoid being inactive for more than 30 minutes at a time. Take frequent breaks to walk or stretch.  Choose a type of exercise or activity that you enjoy, and set realistic goals.  Start slowly, and gradually increase the intensity of your exercise over time. What do I need to know about managing my  diabetes?   Check your blood glucose before and after exercising. ? If your blood glucose is 240 mg/dL (13.3 mmol/L) or higher before you exercise, check your urine for ketones. If you have ketones in your urine, do not exercise until your blood glucose returns to normal. ? If your blood glucose is 100 mg/dL (5.6 mmol/L) or lower, eat a snack containing 15-20 grams of carbohydrate. Check your blood glucose 15 minutes after the snack to make sure that your level is above 100 mg/dL (5.6 mmol/L) before you start your exercise.  Know the symptoms of low blood glucose (hypoglycemia) and how to treat it. Your risk for hypoglycemia increases during and after exercise. Common symptoms of hypoglycemia can include: ? Hunger. ? Anxiety. ? Sweating and feeling clammy. ? Confusion. ? Dizziness or feeling light-headed. ? Increased heart rate or palpitations. ? Blurry vision. ? Tingling or numbness around the mouth, lips, or tongue. ? Tremors or shakes. ? Irritability.  Keep a rapid-acting carbohydrate snack available before, during, and after exercise to help prevent or treat hypoglycemia.  Avoid injecting insulin into areas of the body that are going to be exercised. For example, avoid injecting insulin into: ? The arms, when playing tennis. ? The legs, when jogging.  Keep records of your exercise habits. Doing this can help you and your health care provider adjust your diabetes management plan as needed. Write down: ? Food that you eat before and after you exercise. ? Blood glucose levels before and after you exercise. ? The type and amount of exercise you have done. ? When your insulin is expected to peak, if you use   insulin. Avoid exercising at times when your insulin is peaking.  When you start a new exercise or activity, work with your health care provider to make sure the activity is safe for you, and to adjust your insulin, medicines, or food intake as needed.  Drink plenty of water while  you exercise to prevent dehydration or heat stroke. Drink enough fluid to keep your urine clear or pale yellow. Summary  Exercising regularly is important for your overall health, especially when you have diabetes (diabetes mellitus).  Exercising has many health benefits, such as increasing muscle strength and bone density and reducing body fat and stress.  Your health care provider or certified diabetes educator can help you make a plan for the type and frequency of exercise (activity plan) that works for you.  When you start a new exercise or activity, work with your health care provider to make sure the activity is safe for you, and to adjust your insulin, medicines, or food intake as needed. This information is not intended to replace advice given to you by your health care provider. Make sure you discuss any questions you have with your health care provider. Document Released: 10/23/2003 Document Revised: 02/24/2017 Document Reviewed: 01/12/2016 Elsevier Patient Education  2020 Elsevier Inc.  

## 2019-04-06 ENCOUNTER — Ambulatory Visit
Admission: RE | Admit: 2019-04-06 | Discharge: 2019-04-06 | Disposition: A | Discharge status: Home / Self Care | Payer: Medicare Other | Source: Ambulatory Visit | Attending: Obstetrics & Gynecology | Admitting: Obstetrics & Gynecology

## 2019-04-06 ENCOUNTER — Other Ambulatory Visit: Payer: Self-pay

## 2019-04-06 DIAGNOSIS — Z1231 Encounter for screening mammogram for malignant neoplasm of breast: Secondary | ICD-10-CM

## 2019-04-13 ENCOUNTER — Other Ambulatory Visit: Payer: Self-pay

## 2019-04-16 ENCOUNTER — Ambulatory Visit (INDEPENDENT_AMBULATORY_CARE_PROVIDER_SITE_OTHER): Payer: Medicare Other | Admitting: Physician Assistant

## 2019-04-16 ENCOUNTER — Encounter: Payer: Self-pay | Admitting: Physician Assistant

## 2019-04-16 ENCOUNTER — Other Ambulatory Visit: Payer: Self-pay

## 2019-04-16 VITALS — BP 139/78 | HR 80 | Temp 98.0°F | Ht 61.0 in | Wt 205.4 lb

## 2019-04-16 DIAGNOSIS — I10 Essential (primary) hypertension: Secondary | ICD-10-CM

## 2019-04-16 DIAGNOSIS — R7303 Prediabetes: Secondary | ICD-10-CM | POA: Diagnosis not present

## 2019-04-16 DIAGNOSIS — L2082 Flexural eczema: Secondary | ICD-10-CM | POA: Diagnosis not present

## 2019-04-16 DIAGNOSIS — J301 Allergic rhinitis due to pollen: Secondary | ICD-10-CM | POA: Diagnosis not present

## 2019-04-16 MED ORDER — CLOBETASOL PROPIONATE 0.05 % EX CREA: 1.0000 "application " | TOPICAL_CREAM | Freq: Two times a day (BID) | CUTANEOUS | 2 refills | Status: DC

## 2019-04-23 DIAGNOSIS — L2082 Flexural eczema: Secondary | ICD-10-CM | POA: Insufficient documentation

## 2019-04-23 DIAGNOSIS — J301 Allergic rhinitis due to pollen: Secondary | ICD-10-CM | POA: Insufficient documentation

## 2019-04-23 DIAGNOSIS — R7303 Prediabetes: Secondary | ICD-10-CM | POA: Insufficient documentation

## 2019-04-23 DIAGNOSIS — I10 Essential (primary) hypertension: Secondary | ICD-10-CM | POA: Insufficient documentation

## 2019-04-23 NOTE — Progress Notes (Signed)
BP 139/78   Pulse 80   Temp 98 F (36.7 C) (Temporal)   Ht 5\' 1"  (1.549 m)   Wt 205 lb 6.4 oz (93.2 kg)   BMI 38.81 kg/m    Subjective:    Patient ID: Danielle Ellis, female    DOB: 04/16/1956, 64 y.o.   MRN: [id number]  HPI: Danielle Ellis is a 63 y.o. female presenting on 04/16/2019 for Hypertension (1 month follow up ) Patient comes in for a one-month recheck.  She was previously a patient of Dr. Melina Copa.  She does have known medical conditions including anxiety, hypertension, sleep apnea, prediabetes, allergic rhinitis, eczema.  She does need some refills on some medication we will plan for her to have labs soon.  She is working on a walking and exercise plan.  When she does this her sugars are much better.  Her husband does have diabetes and I tried to eat well.   Past Medical History:  Diagnosis Date  . Anxiety   . Hypertension   . Sleep apnea    Relevant past medical, surgical, family and social history reviewed and updated as indicated. Interim medical history since our last visit reviewed. Allergies and medications reviewed and updated. DATA REVIEWED: CHART IN EPIC  Family History reviewed for pertinent findings.  Review of Systems  Constitutional: Negative.   HENT: Negative.   Eyes: Negative.   Respiratory: Negative.   Gastrointestinal: Negative.   Genitourinary: Negative.     Allergies as of 04/16/2019   No Known Allergies     Medication List       Accurate as of April 16, 2019 11:59 PM. If you have any questions, ask your nurse or doctor.        STOP taking these medications   fenofibrate 160 MG tablet Stopped by: Terald Sleeper, PA-C   lisinopril-hydrochlorothiazide 20-25 MG tablet Commonly known as: ZESTORETIC Stopped by: Terald Sleeper, PA-C     TAKE these medications   calcium gluconate 500 MG tablet Take 500 mg by mouth daily.   Cholecalciferol 125 MCG (5000 UT) capsule Take 5,000 Units by mouth daily.   clobetasol cream 0.05 %  Commonly known as: TEMOVATE Apply 1 application topically 2 (two) times daily. Started by: Terald Sleeper, PA-C   fish oil-omega-3 fatty acids 1000 MG capsule Take 2 g by mouth daily.   Lexapro 5 MG tablet Generic drug: escitalopram Take 1 tablet by mouth daily.   loratadine 10 MG tablet Commonly known as: CLARITIN Take 10 mg by mouth daily.   metFORMIN 500 MG tablet Commonly known as: GLUCOPHAGE Take by mouth daily with breakfast.   multivitamin capsule Take 1 capsule by mouth daily.   valsartan-hydrochlorothiazide 160-25 MG tablet Commonly known as: DIOVAN-HCT Take 1 tablet by mouth daily.          Objective:    BP 139/78   Pulse 80   Temp 98 F (36.7 C) (Temporal)   Ht 5\' 1"  (1.549 m)   Wt 205 lb 6.4 oz (93.2 kg)   BMI 38.81 kg/m   No Known Allergies  Wt Readings from Last 3 Encounters:  04/16/19 205 lb 6.4 oz (93.2 kg)  03/16/19 203 lb 12.8 oz (92.4 kg)  08/02/14 223 lb (101.2 kg)    Physical Exam Constitutional:      General: She is not in acute distress.    Appearance: Normal appearance. She is well-developed.  HENT:     Head: Normocephalic and atraumatic.  Cardiovascular:     Rate and Rhythm: Normal rate.  Pulmonary:     Effort: Pulmonary effort is normal.  Skin:    General: Skin is warm and dry.     Findings: No rash.  Neurological:     Mental Status: She is alert and oriented to person, place, and time.     Deep Tendon Reflexes: Reflexes are normal and symmetric.         Assessment & Plan:   1. Hypertension, unspecified type - valsartan-hydrochlorothiazide (DIOVAN-HCT) 160-25 MG tablet; Take 1 tablet by mouth daily.  2. Flexural eczema Clobetasol cream  3. Prediabetes Continue metformin Low-carb diet  4. Allergic rhinitis due to pollen, unspecified seasonality Continue Claritin   Continue all other maintenance medications as listed above.  Follow up plan: Return in about 3 months (around 07/16/2019) for recheck  medications and labs.  Educational handout given for carb counting  Remus LofflerAngel S. Jarold Macomber PA-C [facility] [address]  [place], [place] [id number] [phone]   04/23/2019, 8:47 PM

## 2019-06-29 ENCOUNTER — Encounter: Payer: Self-pay | Admitting: *Deleted

## 2019-07-03 ENCOUNTER — Ambulatory Visit (INDEPENDENT_AMBULATORY_CARE_PROVIDER_SITE_OTHER): Payer: Medicare Other | Admitting: Family

## 2019-07-03 ENCOUNTER — Other Ambulatory Visit: Payer: Self-pay

## 2019-07-03 ENCOUNTER — Encounter: Payer: Self-pay | Admitting: Family

## 2019-07-03 DIAGNOSIS — M545 Low back pain, unspecified: Secondary | ICD-10-CM

## 2019-07-03 MED ORDER — PREDNISONE 10 MG (21) PO TBPK: ORAL_TABLET | ORAL | 0 refills | Status: DC

## 2019-07-03 MED ORDER — DICLOFENAC SODIUM 75 MG PO TBEC: 75.0000 mg | DELAYED_RELEASE_TABLET | Freq: Two times a day (BID) | ORAL | 0 refills | Status: DC

## 2019-07-03 MED ORDER — BACLOFEN 10 MG PO TABS: 10.0000 mg | ORAL_TABLET | Freq: Three times a day (TID) | ORAL | 0 refills | Status: DC

## 2019-07-03 NOTE — Progress Notes (Signed)
Virtual Visit via telephone Note Due to COVID-19 pandemic this visit was conducted virtually. This visit type was conducted due to national recommendations for restrictions regarding the COVID-19 Pandemic (e.g. social distancing, sheltering in place) in an effort to limit this patient's exposure and mitigate transmission in our community. All issues noted in this document were discussed and addressed.  A physical exam was not performed with this format.  I connected with Danielle Ellis on 07/03/19 at 12:53 pm by telephone and verified that I am speaking with the correct person using two identifiers. Danielle Ellis is currently located at home and no one is currently with her during visit. The provider, Jannifer Rodney, FNP is located in their office at time of visit.  I discussed the limitations, risks, security and privacy concerns of performing an evaluation and management service by telephone and the availability of in person appointments. I also discussed with the patient that there may be a patient responsible charge related to this service. The patient expressed understanding and agreed to proceed.   History and Present Illness:  PT calls the office today with left lower back/gluteal pain that started after she did yard work this weekend.  Back Pain This is a new problem. The current episode started in the past 7 days. The problem occurs constantly. The problem has been gradually worsening since onset. The pain is present in the gluteal. The pain does not radiate. The pain is at a severity of 10/10. The pain is moderate. The symptoms are aggravated by lying down. Pertinent negatives include no bladder incontinence, chest pain, dysuria, leg pain, numbness, perianal numbness or tingling. Risk factors include obesity. She has tried heat, ice and bed rest for the symptoms. The treatment provided mild relief.      Review of Systems  Cardiovascular: Negative for chest pain.  Genitourinary:  Negative for bladder incontinence and dysuria.  Musculoskeletal: Positive for back pain.  Neurological: Negative for tingling and numbness.  All other systems reviewed and are negative.    Observations/Objective: No SOB or distress noted   Assessment and Plan: 1. Acute left-sided low back pain without sciatica Rest Heat as needed ROM exercises No other NSAID's while taking diclofenac  Sedation precautions discussed Call if symptoms worsen or do not improve  - diclofenac (VOLTAREN) 75 MG EC tablet; Take 1 tablet (75 mg total) by mouth 2 (two) times daily.  Dispense: 30 tablet; Refill: 0 - baclofen (LIORESAL) 10 MG tablet; Take 1 tablet (10 mg total) by mouth 3 (three) times daily.  Dispense: 30 each; Refill: 0 - predniSONE (STERAPRED UNI-PAK 21 TAB) 10 MG (21) TBPK tablet; Use as directed  Dispense: 21 tablet; Refill: 0     I discussed the assessment and treatment plan with the patient. The patient was provided an opportunity to ask questions and all were answered. The patient agreed with the plan and demonstrated an understanding of the instructions.   The patient was advised to call back or seek an in-person evaluation if the symptoms worsen or if the condition fails to improve as anticipated.  The above assessment and management plan was discussed with the patient. The patient verbalized understanding of and has agreed to the management plan. Patient is aware to call the clinic if symptoms persist or worsen. Patient is aware when to return to the clinic for a follow-up visit. Patient educated on when it is appropriate to go to the emergency department.   Time call ended:  1:02 pm  I provided 9 minutes of non-face-to-face time during this encounter.    Evelina Dun, FNP

## 2019-07-14 ENCOUNTER — Other Ambulatory Visit: Payer: Self-pay | Admitting: Family

## 2019-07-14 DIAGNOSIS — M545 Low back pain, unspecified: Secondary | ICD-10-CM

## 2019-07-18 ENCOUNTER — Ambulatory Visit (INDEPENDENT_AMBULATORY_CARE_PROVIDER_SITE_OTHER): Payer: Medicare Other | Admitting: Physician Assistant

## 2019-07-18 ENCOUNTER — Encounter: Payer: Self-pay | Admitting: Physician Assistant

## 2019-07-18 VITALS — BP 135/74

## 2019-07-18 DIAGNOSIS — I10 Essential (primary) hypertension: Secondary | ICD-10-CM | POA: Diagnosis not present

## 2019-07-18 DIAGNOSIS — R7303 Prediabetes: Secondary | ICD-10-CM

## 2019-07-18 DIAGNOSIS — M545 Low back pain, unspecified: Secondary | ICD-10-CM

## 2019-07-18 DIAGNOSIS — Z0000 Encounter for general adult medical examination without abnormal findings: Secondary | ICD-10-CM

## 2019-07-18 MED ORDER — LORATADINE 10 MG PO TABS: 10.0000 mg | ORAL_TABLET | Freq: Every day | ORAL | 3 refills | Status: DC

## 2019-07-18 MED ORDER — TRIAMCINOLONE ACETONIDE 0.05 % EX OINT: 1.0000 "application " | TOPICAL_OINTMENT | Freq: Two times a day (BID) | CUTANEOUS | 2 refills | Status: DC

## 2019-07-18 MED ORDER — BACLOFEN 10 MG PO TABS: 10.0000 mg | ORAL_TABLET | Freq: Three times a day (TID) | ORAL | 1 refills | Status: DC

## 2019-07-18 MED ORDER — DICLOFENAC SODIUM 75 MG PO TBEC: 75.0000 mg | DELAYED_RELEASE_TABLET | Freq: Two times a day (BID) | ORAL | 1 refills | Status: DC

## 2019-07-18 MED ORDER — VALSARTAN-HYDROCHLOROTHIAZIDE 160-25 MG PO TABS: 1.0000 | ORAL_TABLET | Freq: Every day | ORAL | 3 refills | Status: DC

## 2019-07-18 MED ORDER — METFORMIN HCL 500 MG PO TABS: 500.0000 mg | ORAL_TABLET | Freq: Every day | ORAL | 3 refills | Status: DC

## 2019-07-18 MED ORDER — ESCITALOPRAM OXALATE 5 MG PO TABS: 5.0000 mg | ORAL_TABLET | Freq: Every day | ORAL | 3 refills | Status: DC

## 2019-07-18 NOTE — Progress Notes (Signed)
Telephone visit  Subjective: MW:NUUVOZD on chronic medical conditions PCP: Terald Sleeper, PA-C Danielle Ellis is a 63 y.o. female calls for telephone consult today. Patient provides verbal consent for consult held via phone.  Patient is identified with 2 separate identifiers.  At this time the entire area is on COVID-19 social distancing and stay home orders are in place.  Patient is of higher risk and therefore we are performing this by a virtual method.  Location of patient: Home Location of provider: HOME Others present for call: No  This patient is having a 38-monthfollow-up on her chronic medical conditions.  They do include hypertension, prediabetes.  Her A1c has always been under 6.  And I got her to continue with her diet and Forman 500 daily she did cheese and need to have refills will place labs at your future she states that she is doing fairly well her blood sugars have been between 100 130 most mornings.  She has not had any episodes of low.  BP 135/74    ROS: Per HPI  No Known Allergies Past Medical History:  Diagnosis Date  . Anxiety   . Hypertension   . Sleep apnea     Current Outpatient Medications:  .  baclofen (LIORESAL) 10 MG tablet, Take 1 tablet (10 mg total) by mouth 3 (three) times daily., Disp: 60 each, Rfl: 1 .  calcium gluconate 500 MG tablet, Take 500 mg by mouth daily., Disp: , Rfl:  .  Cholecalciferol 125 MCG (5000 UT) capsule, Take 5,000 Units by mouth daily., Disp: , Rfl:  .  diclofenac (VOLTAREN) 75 MG EC tablet, Take 1 tablet (75 mg total) by mouth 2 (two) times daily., Disp: 60 tablet, Rfl: 1 .  escitalopram (LEXAPRO) 5 MG tablet, Take 1 tablet (5 mg total) by mouth daily., Disp: 90 tablet, Rfl: 3 .  fish oil-omega-3 fatty acids 1000 MG capsule, Take 2 g by mouth daily., Disp: , Rfl:  .  loratadine (CLARITIN) 10 MG tablet, Take 1 tablet (10 mg total) by mouth daily., Disp: 90 tablet, Rfl: 3 .  metFORMIN (GLUCOPHAGE) 500 MG tablet,  Take 1 tablet (500 mg total) by mouth daily with breakfast., Disp: 90 tablet, Rfl: 3 .  Multiple Vitamin (MULTIVITAMIN) capsule, Take 1 capsule by mouth daily., Disp: , Rfl:  .  predniSONE (STERAPRED UNI-PAK 21 TAB) 10 MG (21) TBPK tablet, Use as directed, Disp: 21 tablet, Rfl: 0 .  TRIAMCINOLONE ACETONIDE, TOP, 0.05 % OINT, Apply 1 application topically 2 (two) times daily., Disp: 60 g, Rfl: 2 .  valsartan-hydrochlorothiazide (DIOVAN-HCT) 160-25 MG tablet, Take 1 tablet by mouth daily., Disp: 90 tablet, Rfl: 3  Assessment/ Plan: 63y.o. female   1. Hypertension, unspecified type - valsartan-hydrochlorothiazide (DIOVAN-HCT) 160-25 MG tablet; Take 1 tablet by mouth daily.  Dispense: 90 tablet; Refill: 3 - CBC with Differential/Platelet; Future - CMP14+EGFR; Future - Lipid Panel; Future - Microalbumin / creatinine urine ratio; Future  2. Acute left-sided low back pain without sciatica - diclofenac (VOLTAREN) 75 MG EC tablet; Take 1 tablet (75 mg total) by mouth 2 (two) times daily.  Dispense: 60 tablet; Refill: 1 - baclofen (LIORESAL) 10 MG tablet; Take 1 tablet (10 mg total) by mouth 3 (three) times daily.  Dispense: 60 each; Refill: 1  3. Well adult exam - CBC with Differential/Platelet; Future - CMP14+EGFR; Future - Lipid Panel; Future - TSH; Future  4. Prediabetes - metFORMIN (GLUCOPHAGE) 500 MG tablet; Take 1 tablet (500  mg total) by mouth daily with breakfast.  Dispense: 90 tablet; Refill: 3 - CBC with Differential/Platelet; Future - CMP14+EGFR; Future - Lipid Panel; Future - hgba1c; Future - Microalbumin / creatinine urine ratio; Future   No follow-ups on file.  Continue all other maintenance medications as listed above.  Start time: 8:09 AM End time: 8:25 AM  Meds ordered this encounter  Medications  . valsartan-hydrochlorothiazide (DIOVAN-HCT) 160-25 MG tablet    Sig: Take 1 tablet by mouth daily.    Dispense:  90 tablet    Refill:  3    Order Specific Question:    Supervising Provider    Answer:   Janora Norlander [[id number]]  . metFORMIN (GLUCOPHAGE) 500 MG tablet    Sig: Take 1 tablet (500 mg total) by mouth daily with breakfast.    Dispense:  90 tablet    Refill:  3    Order Specific Question:   Supervising Provider    Answer:   Janora Norlander [[id number]]  . loratadine (CLARITIN) 10 MG tablet    Sig: Take 1 tablet (10 mg total) by mouth daily.    Dispense:  90 tablet    Refill:  3    Order Specific Question:   Supervising Provider    Answer:   Janora Norlander [[id number]]  . escitalopram (LEXAPRO) 5 MG tablet    Sig: Take 1 tablet (5 mg total) by mouth daily.    Dispense:  90 tablet    Refill:  3    Order Specific Question:   Supervising Provider    Answer:   Janora Norlander [[id number]]  . diclofenac (VOLTAREN) 75 MG EC tablet    Sig: Take 1 tablet (75 mg total) by mouth 2 (two) times daily.    Dispense:  60 tablet    Refill:  1    Keep on file until patient calls    Order Specific Question:   Supervising Provider    Answer:   Janora Norlander [[id number]]  . baclofen (LIORESAL) 10 MG tablet    Sig: Take 1 tablet (10 mg total) by mouth 3 (three) times daily.    Dispense:  60 each    Refill:  1    Keep on file until patient calls    Order Specific Question:   Supervising Provider    Answer:   Janora Norlander [[id number]]  . TRIAMCINOLONE ACETONIDE, TOP, 0.05 % OINT    Sig: Apply 1 application topically 2 (two) times daily.    Dispense:  60 g    Refill:  2    Keep on file until patient calls    Order Specific Question:   Supervising Provider    Answer:   Janora Norlander [[id number]]    Particia Nearing PA-C [place] [phone]

## 2019-09-05 ENCOUNTER — Other Ambulatory Visit: Payer: Self-pay | Admitting: Physician Assistant

## 2019-09-05 DIAGNOSIS — R7303 Prediabetes: Secondary | ICD-10-CM

## 2019-09-05 DIAGNOSIS — I10 Essential (primary) hypertension: Secondary | ICD-10-CM

## 2019-09-05 MED ORDER — ESCITALOPRAM OXALATE 5 MG PO TABS: 5.0000 mg | ORAL_TABLET | Freq: Every day | ORAL | 3 refills | Status: DC

## 2019-09-05 MED ORDER — LORATADINE 10 MG PO TABS: 10.0000 mg | ORAL_TABLET | Freq: Every day | ORAL | 3 refills | Status: DC

## 2019-09-05 MED ORDER — VALSARTAN-HYDROCHLOROTHIAZIDE 160-25 MG PO TABS: 1.0000 | ORAL_TABLET | Freq: Every day | ORAL | 3 refills | Status: DC

## 2019-09-05 MED ORDER — METFORMIN HCL 500 MG PO TABS: 500.0000 mg | ORAL_TABLET | Freq: Every day | ORAL | 3 refills | Status: DC

## 2019-09-05 NOTE — Telephone Encounter (Signed)
What is the name of the medication?  baclofen (LIORESAL) 10 MG tablet diclofenac (VOLTAREN) 75 MG EC tablet escitalopram (LEXAPRO) 5 MG tablet loratadine (CLARITIN) 10 MG tablet metFORMIN (GLUCOPHAGE) 500 MG tablet valsartan-hydrochlorothiazide (DIOVAN-HCT) 160-25 MG tablet  Have you contacted your pharmacy to request a refill? No pt has new insurance and needs rx sent to [facility] fax [phone] Pre authorization fax [phone]  Which pharmacy would you like this sent to? [facility]   Patient notified that their request is being sent to the clinical staff for review and that they should receive a call once it is complete. If they do not receive a call within 24 hours they can check with their pharmacy or our office.

## 2019-09-05 NOTE — Telephone Encounter (Signed)
Is it ok to do diclofenac and baclofen for 3 month supply to send into the mail in pharmacy? I have already set up the other ones but wasn't sure if you were ok with these 2. Please advise?

## 2019-09-07 ENCOUNTER — Telehealth: Payer: Self-pay | Admitting: Physician Assistant

## 2019-09-07 DIAGNOSIS — M545 Low back pain, unspecified: Secondary | ICD-10-CM

## 2019-09-07 MED ORDER — BACLOFEN 10 MG PO TABS: 10.0000 mg | ORAL_TABLET | Freq: Three times a day (TID) | ORAL | 1 refills | Status: DC

## 2019-09-07 NOTE — Telephone Encounter (Signed)
Done

## 2019-10-25 ENCOUNTER — Other Ambulatory Visit: Payer: Self-pay

## 2019-10-25 ENCOUNTER — Other Ambulatory Visit: Payer: Medicare Other

## 2019-10-25 DIAGNOSIS — Z0000 Encounter for general adult medical examination without abnormal findings: Secondary | ICD-10-CM

## 2019-10-25 DIAGNOSIS — I10 Essential (primary) hypertension: Secondary | ICD-10-CM

## 2019-10-25 DIAGNOSIS — R7303 Prediabetes: Secondary | ICD-10-CM | POA: Diagnosis not present

## 2019-10-25 LAB — BAYER DCA HB A1C WAIVED: HB A1C (BAYER DCA - WAIVED): 6.1 % (ref <7.0)

## 2019-10-26 LAB — LIPID PANEL
Chol/HDL Ratio: 8.4 ratio — ABNORMAL HIGH (ref 0.0–4.4)
Cholesterol, Total: 397 mg/dL — ABNORMAL HIGH (ref 100–199)
HDL: 47 mg/dL (ref >39)
LDL Chol Calc (NIH): 253 mg/dL — ABNORMAL HIGH (ref 0–99)
Triglycerides: 425 mg/dL — ABNORMAL HIGH (ref 0–149)
VLDL Cholesterol Cal: 97 mg/dL — ABNORMAL HIGH (ref 5–40)

## 2019-10-26 LAB — CMP14+EGFR
ALT: 41 IU/L — ABNORMAL HIGH (ref 0–32)
AST: 29 IU/L (ref 0–40)
Albumin/Globulin Ratio: 1.9 (ref 1.2–2.2)
Albumin: 4.7 g/dL (ref 3.8–4.8)
Alkaline Phosphatase: 130 IU/L — ABNORMAL HIGH (ref 39–117)
BUN/Creatinine Ratio: 16 (ref 12–28)
BUN: 14 mg/dL (ref 8–27)
Bilirubin Total: 0.4 mg/dL (ref 0.0–1.2)
CO2: 22 mmol/L (ref 20–29)
Calcium: 9.7 mg/dL (ref 8.7–10.3)
Chloride: 100 mmol/L (ref 96–106)
Creatinine, Ser: 0.87 mg/dL (ref 0.57–1.00)
GFR calc Af Amer: 81 mL/min/{1.73_m2} (ref >59)
GFR calc non Af Amer: 70 mL/min/{1.73_m2} (ref >59)
Globulin, Total: 2.5 g/dL (ref 1.5–4.5)
Glucose: 119 mg/dL — ABNORMAL HIGH (ref 65–99)
Potassium: 3.4 mmol/L — ABNORMAL LOW (ref 3.5–5.2)
Sodium: 141 mmol/L (ref 134–144)
Total Protein: 7.2 g/dL (ref 6.0–8.5)

## 2019-10-26 LAB — CBC WITH DIFFERENTIAL/PLATELET
Basophils Absolute: 0 10*3/uL (ref 0.0–0.2)
Basos: 1 %
EOS (ABSOLUTE): 0.1 10*3/uL (ref 0.0–0.4)
Eos: 2 %
Hematocrit: 43 % (ref 34.0–46.6)
Hemoglobin: 14 g/dL (ref 11.1–15.9)
Immature Grans (Abs): 0 10*3/uL (ref 0.0–0.1)
Immature Granulocytes: 0 %
Lymphocytes Absolute: 2.2 10*3/uL (ref 0.7–3.1)
Lymphs: 44 %
MCH: 28.5 pg (ref 26.6–33.0)
MCHC: 32.6 g/dL (ref 31.5–35.7)
MCV: 88 fL (ref 79–97)
Monocytes Absolute: 0.4 10*3/uL (ref 0.1–0.9)
Monocytes: 8 %
Neutrophils Absolute: 2.2 10*3/uL (ref 1.4–7.0)
Neutrophils: 45 %
Platelets: 167 10*3/uL (ref 150–450)
RBC: 4.91 x10E6/uL (ref 3.77–5.28)
RDW: 13.3 % (ref 11.7–15.4)
WBC: 4.9 10*3/uL (ref 3.4–10.8)

## 2019-10-26 LAB — MICROALBUMIN / CREATININE URINE RATIO
Creatinine, Urine: 184.4 mg/dL
Microalb/Creat Ratio: 5 mg/g creat (ref 0–29)
Microalbumin, Urine: 8.4 ug/mL

## 2019-10-26 LAB — TSH: TSH: 2.48 u[IU]/mL (ref 0.450–4.500)

## 2019-10-29 ENCOUNTER — Encounter: Payer: Medicare Other | Admitting: Physician Assistant

## 2019-10-30 ENCOUNTER — Encounter: Payer: Self-pay | Admitting: Physician Assistant

## 2019-10-30 ENCOUNTER — Ambulatory Visit (INDEPENDENT_AMBULATORY_CARE_PROVIDER_SITE_OTHER): Payer: Medicare Other | Admitting: Physician Assistant

## 2019-10-30 DIAGNOSIS — E782 Mixed hyperlipidemia: Secondary | ICD-10-CM

## 2019-10-30 MED ORDER — LORATADINE 10 MG PO TABS: 10.0000 mg | ORAL_TABLET | Freq: Every day | ORAL | 3 refills | Status: DC

## 2019-10-30 MED ORDER — PRAVASTATIN SODIUM 20 MG PO TABS: 20.0000 mg | ORAL_TABLET | Freq: Every day | ORAL | 3 refills | Status: DC

## 2019-10-30 NOTE — Progress Notes (Signed)
Telephone visit  Subjective: CC: Elevated cholesterol PCP: Terald Sleeper, PA-C Danielle Ellis is a 64 y.o. female calls for telephone consult today. Patient provides verbal consent for consult held via phone.  Patient is identified with 2 separate identifiers.  At this time the entire area is on COVID-19 social distancing and stay home orders are in place.  Patient is of higher risk and therefore we are performing this by a virtual method.  Location of patient: Home Location of provider: HOME Others present for call: No   This patient is having a recheck on her chronic conditions which do include hyperlipidemia now, she did have some prediabetes and hypertension.  Her recent labs did show her cholesterol still to be elevated.  So she is wanting to go ahead and start medication.  She does remember taking a statin in the past and it giving her some muscle aches.  We are going to start with a milder statin, pravastatin and see how she tolerates it.  If it does not bother her we will try to increase it to the most effective and tolerated dose.  Orders have been placed and she will come in the another couple months.    ROS: Per HPI  No Known Allergies Past Medical History:  Diagnosis Date  . Anxiety   . Hypertension   . Sleep apnea     Current Outpatient Medications:  .  baclofen (LIORESAL) 10 MG tablet, Take 1 tablet (10 mg total) by mouth 3 (three) times daily., Disp: 60 each, Rfl: 1 .  calcium gluconate 500 MG tablet, Take 500 mg by mouth daily., Disp: , Rfl:  .  Cholecalciferol 125 MCG (5000 UT) capsule, Take 5,000 Units by mouth daily., Disp: , Rfl:  .  diclofenac (VOLTAREN) 75 MG EC tablet, Take 1 tablet (75 mg total) by mouth 2 (two) times daily., Disp: 60 tablet, Rfl: 1 .  escitalopram (LEXAPRO) 5 MG tablet, Take 1 tablet (5 mg total) by mouth daily., Disp: 90 tablet, Rfl: 3 .  fish oil-omega-3 fatty acids 1000 MG capsule, Take 2 g by mouth daily., Disp: , Rfl:   .  loratadine (CLARITIN) 10 MG tablet, Take 1 tablet (10 mg total) by mouth daily., Disp: 90 tablet, Rfl: 3 .  metFORMIN (GLUCOPHAGE) 500 MG tablet, Take 1 tablet (500 mg total) by mouth daily with breakfast., Disp: 90 tablet, Rfl: 3 .  Multiple Vitamin (MULTIVITAMIN) capsule, Take 1 capsule by mouth daily., Disp: , Rfl:  .  pravastatin (PRAVACHOL) 20 MG tablet, Take 1 tablet (20 mg total) by mouth daily., Disp: 90 tablet, Rfl: 3 .  predniSONE (STERAPRED UNI-PAK 21 TAB) 10 MG (21) TBPK tablet, Use as directed, Disp: 21 tablet, Rfl: 0 .  TRIAMCINOLONE ACETONIDE, TOP, 0.05 % OINT, Apply 1 application topically 2 (two) times daily., Disp: 60 g, Rfl: 2 .  valsartan-hydrochlorothiazide (DIOVAN-HCT) 160-25 MG tablet, Take 1 tablet by mouth daily., Disp: 90 tablet, Rfl: 3  Assessment/ Plan: 64 y.o. female   1. Mixed hyperlipidemia - pravastatin (PRAVACHOL) 20 MG tablet; Take 1 tablet (20 mg total) by mouth daily.  Dispense: 90 tablet; Refill: 3 - Lipid panel; Future - CMP14+EGFR; Future   Return in about 6 months (around 05/01/2020).  Continue all other maintenance medications as listed above.  I discussed the assessment and treatment plan with the patient. The patient was provided an opportunity to ask questions and all were answered. The patient agreed with the plan and demonstrated  an understanding of the instructions.   The patient was advised to call back or seek an in-person evaluation if the symptoms worsen or if the condition fails to improve as anticipated.   The above assessment and management plan was discussed with the patient. The patient verbalized understanding of and has agreed to the management plan. Patient is aware to call the clinic if symptoms persist or worsen. Patient is aware when to return to the clinic for a follow-up visit. Patient educated on when it is appropriate to go to the emergency department.    Start time: 12:29 PM End time: 12:39 PM  Meds ordered this  encounter  Medications  . pravastatin (PRAVACHOL) 20 MG tablet    Sig: Take 1 tablet (20 mg total) by mouth daily.    Dispense:  90 tablet    Refill:  3    Order Specific Question:   Supervising Provider    Answer:   Janora Norlander [[id number]]  . loratadine (CLARITIN) 10 MG tablet    Sig: Take 1 tablet (10 mg total) by mouth daily.    Dispense:  90 tablet    Refill:  3    Order Specific Question:   Supervising Provider    Answer:   Janora Norlander [[id number]]    Particia Nearing PA-C [place] [phone]

## 2019-11-15 ENCOUNTER — Encounter: Payer: Medicare Other | Admitting: Family

## 2019-11-26 ENCOUNTER — Encounter: Payer: Medicare Other | Admitting: Physician Assistant

## 2019-12-07 ENCOUNTER — Encounter: Payer: Self-pay | Admitting: Family

## 2019-12-07 ENCOUNTER — Ambulatory Visit (INDEPENDENT_AMBULATORY_CARE_PROVIDER_SITE_OTHER): Payer: Medicare Other | Admitting: Family

## 2019-12-07 ENCOUNTER — Other Ambulatory Visit: Payer: Self-pay

## 2019-12-07 VITALS — BP 149/80 | HR 81 | Temp 97.2°F | Ht 61.0 in | Wt 215.2 lb

## 2019-12-07 DIAGNOSIS — Z0000 Encounter for general adult medical examination without abnormal findings: Secondary | ICD-10-CM

## 2019-12-07 DIAGNOSIS — I10 Essential (primary) hypertension: Secondary | ICD-10-CM

## 2019-12-07 NOTE — Patient Instructions (Signed)

## 2019-12-07 NOTE — Progress Notes (Signed)
Subjective:   Danielle Ellis is a 64 y.o. female who presents for an Initial Medicare Annual Wellness Visit.  Review of Systems    No complaints today  Cardiac Risk Factors include: advanced age (>42men, >20 women)     Objective:    Today's Vitals   12/07/19 1045 12/07/19 1046  BP: (!) 153/81 (!) 149/80  Pulse: 81   Temp: (!) 97.2 F (36.2 C)   TempSrc: Temporal   SpO2: 96%   Weight: 215 lb 3.2 oz (97.6 kg)   Height: 5\' 1"  (1.549 m)    Body mass index is 40.66 kg/m.  Advanced Directives 12/07/2019  Does Patient Have a Medical Advance Directive? No  Would patient like information on creating a medical advance directive? Yes (Inpatient - patient defers creating a medical advance directive at this time - Information given)    Current Medications (verified) Outpatient Encounter Medications as of 12/07/2019  Medication Sig  . calcium gluconate 500 MG tablet Take 500 mg by mouth daily.  . Cholecalciferol 125 MCG (5000 UT) capsule Take 5,000 Units by mouth daily.  Marland Kitchen escitalopram (LEXAPRO) 5 MG tablet Take 1 tablet (5 mg total) by mouth daily.  Marland Kitchen loratadine (CLARITIN) 10 MG tablet Take 1 tablet (10 mg total) by mouth daily.  . metFORMIN (GLUCOPHAGE) 500 MG tablet Take 1 tablet (500 mg total) by mouth daily with breakfast.  . Multiple Vitamin (MULTIVITAMIN) capsule Take 1 capsule by mouth daily.  . pravastatin (PRAVACHOL) 20 MG tablet Take 1 tablet (20 mg total) by mouth daily.  . valsartan-hydrochlorothiazide (DIOVAN-HCT) 160-25 MG tablet Take 1 tablet by mouth daily.  . [DISCONTINUED] predniSONE (STERAPRED UNI-PAK 21 TAB) 10 MG (21) TBPK tablet Use as directed  . baclofen (LIORESAL) 10 MG tablet Take 1 tablet (10 mg total) by mouth 3 (three) times daily. (Patient not taking: Reported on 12/07/2019)  . diclofenac (VOLTAREN) 75 MG EC tablet Take 1 tablet (75 mg total) by mouth 2 (two) times daily. (Patient not taking: Reported on 12/07/2019)  . fish oil-omega-3 fatty acids 1000  MG capsule Take 2 g by mouth daily.  . [DISCONTINUED] TRIAMCINOLONE ACETONIDE, TOP, 0.05 % OINT Apply 1 application topically 2 (two) times daily. (Patient not taking: Reported on 12/07/2019)   No facility-administered encounter medications on file as of 12/07/2019.    Allergies (verified) Patient has no known allergies.   History: Past Medical History:  Diagnosis Date  . Anxiety   . Hypertension   . Sleep apnea    Past Surgical History:  Procedure Laterality Date  . APPENDECTOMY    . L ankle surgery     Family History  Problem Relation Age of Onset  . Diabetes Mother   . Kidney disease Mother   . Cancer Mother   . Heart attack Mother    Social History   Socioeconomic History  . Marital status: Married    Spouse name: Not on file  . Number of children: Not on file  . Years of education: Not on file  . Highest education level: Not on file  Occupational History  . Not on file  Tobacco Use  . Smoking status: Never Smoker  . Smokeless tobacco: Never Used  Substance and Sexual Activity  . Alcohol use: No  . Drug use: No  . Sexual activity: Yes    Partners: Male  Other Topics Concern  . Not on file  Social History Narrative  . Not on file   Social Determinants of Health  Financial Resource Strain:   . Difficulty of Paying Living Expenses:   Food Insecurity:   . Worried About Programme researcher, broadcasting/film/video in the Last Year:   . Barista in the Last Year:   Transportation Needs:   . Freight forwarder (Medical):   Marland Kitchen Lack of Transportation (Non-Medical):   Physical Activity:   . Days of Exercise per Week:   . Minutes of Exercise per Session:   Stress:   . Feeling of Stress :   Social Connections:   . Frequency of Communication with Friends and Family:   . Frequency of Social Gatherings with Friends and Family:   . Attends Religious Services:   . Active Member of Clubs or Organizations:   . Attends Banker Meetings:   Marland Kitchen Marital Status:      Tobacco Counseling Counseling given: Not Answered   Clinical Intake:                        Activities of Daily Living In your present state of health, do you have any difficulty performing the following activities: 12/07/2019  Hearing? Y  Vision? Y  Difficulty concentrating or making decisions? Y  Walking or climbing stairs? Y  Dressing or bathing? Y  Doing errands, shopping? Y  Preparing Food and eating ? Y  Using the Toilet? Y  In the past six months, have you accidently leaked urine? N  Do you have problems with loss of bowel control? Y  Managing your Medications? Y  Managing your Finances? Y  Housekeeping or managing your Housekeeping? Y  Some recent data might be hidden     Immunizations and Health Maintenance Immunization History  Administered Date(s) Administered  . Fluad Quad(high Dose 65+) 05/23/2019  . Moderna SARS-COVID-2 Vaccination 10/11/2019, 11/09/2019  . Pneumococcal Conjugate-13 05/23/2019  . Pneumococcal-Unspecified 06/14/2018   Health Maintenance Due  Topic Date Due  . Hepatitis C Screening  Never done  . HIV Screening  Never done  . TETANUS/TDAP  Never done  . PAP SMEAR-Modifier  05/16/2016    Patient Care Team: Junie Spencer, FNP as PCP - General (Family Medicine)  Indicate any recent Medical Services you may have received from other than [facility] providers in the past year (date may be approximate).     Assessment:   This is a routine wellness examination for Danielle Ellis.  Hearing/Vision screen No exam data present  Dietary issues and exercise activities discussed: Current Exercise Habits: The patient does not participate in regular exercise at present, Exercise limited by: None identified  Goals   None    Depression Screen PHQ 2/9 Scores 12/07/2019 04/16/2019 03/16/2019  PHQ - 2 Score 0 0 0  PHQ- 9 Score 2 - -    Fall Risk Fall Risk  12/07/2019 04/16/2019 03/16/2019  Falls in the past year? 0 0 0    Is the patient's home  free of loose throw rugs in walkways, pet beds, electrical cords, etc?   no      Grab bars in the bathroom? no      Handrails on the stairs?   no, husband is installing this week      Adequate lighting?   yes    Cognitive Function:     6CIT Screen 12/07/2019  What Year? 0 points  What month? 0 points  What time? 0 points  Count back from 20 0 points  Months in reverse 0 points  Repeat phrase  0 points  Total Score 0    Screening Tests Health Maintenance  Topic Date Due  . Hepatitis C Screening  Never done  . HIV Screening  Never done  . TETANUS/TDAP  Never done  . PAP SMEAR-Modifier  05/16/2016  . INFLUENZA VACCINE  03/16/2020  . MAMMOGRAM  04/05/2021  . PNA vac Low Risk Adult (2 of 2 - PPSV23) 06/15/2023  . COLONOSCOPY  07/29/2025  . DEXA SCAN  Completed  . COVID-19 Vaccine  Completed    Qualifies for Shingles Vaccine? Yes, does not want today.   Cancer Screenings: Lung: Low Dose CT Chest recommended if Age 26-80 years, 30 pack-year currently smoking OR have quit w/in 15years. Patient does not qualify. Breast: Up to date on Mammogram? Yes   Up to date of Bone Density/Dexa? Yes Colorectal: 07/30/15     Plan:     I have personally reviewed and noted the following in the patient's chart:   . Medical and social history . Use of alcohol, tobacco or illicit drugs  . Current medications and supplements . Functional ability and status . Nutritional status . Physical activity . Advanced directives . List of other physicians . Hospitalizations, surgeries, and ER visits in previous 12 months . Vitals . Screenings to include cognitive, depression, and falls . Referrals and appointments  In addition, I have reviewed and discussed with patient certain preventive protocols, quality metrics, and best practice recommendations. A written personalized care plan for preventive services as well as general preventive health recommendations were provided to patient.      Jannifer Rodney, [place]   12/07/2019

## 2019-12-11 ENCOUNTER — Telehealth: Payer: Self-pay | Admitting: Family

## 2019-12-11 DIAGNOSIS — H2513 Age-related nuclear cataract, bilateral: Secondary | ICD-10-CM | POA: Diagnosis not present

## 2019-12-11 DIAGNOSIS — H40053 Ocular hypertension, bilateral: Secondary | ICD-10-CM | POA: Diagnosis not present

## 2019-12-11 NOTE — Telephone Encounter (Signed)
Pt called stating that she talked to [facility] at her last visit about being prescribed Baclofen and Christy agreed to prescribing Rx but pt says that Rx has not been sent to pharmacy yet. Pt uses [facility] mail order.

## 2019-12-13 MED ORDER — BACLOFEN 10 MG PO TABS: 10.0000 mg | ORAL_TABLET | Freq: Three times a day (TID) | ORAL | 2 refills | Status: DC

## 2019-12-13 NOTE — Telephone Encounter (Signed)
Prescription sent to pharmacy.

## 2020-02-25 ENCOUNTER — Telehealth: Payer: Self-pay | Admitting: Family

## 2020-02-25 MED ORDER — VALACYCLOVIR HCL 1 G PO TABS: 2000.0000 mg | ORAL_TABLET | Freq: Two times a day (BID) | ORAL | 2 refills | Status: DC

## 2020-02-25 NOTE — Telephone Encounter (Signed)
Valtrex Prescription sent to pharmacy

## 2020-02-25 NOTE — Telephone Encounter (Signed)
Patient aware.

## 2020-02-25 NOTE — Telephone Encounter (Signed)
°  Prescription Request  02/25/2020  What is the name of the medication or equipment? valaciclobir /fever blister meds.  Have you contacted your pharmacy to request a refill? (if applicable) no  Which pharmacy would you like this sent to? [facility]   Patient notified that their request is being sent to the clinical staff for review and that they should receive a response within 2 business days.

## 2020-02-25 NOTE — Telephone Encounter (Signed)
This med is not on her list - nor in her med HX - please advise  Valtrex

## 2020-03-10 ENCOUNTER — Other Ambulatory Visit: Payer: Self-pay

## 2020-03-10 ENCOUNTER — Other Ambulatory Visit: Payer: Medicare Other

## 2020-03-10 DIAGNOSIS — Z0000 Encounter for general adult medical examination without abnormal findings: Secondary | ICD-10-CM | POA: Diagnosis not present

## 2020-03-10 DIAGNOSIS — R739 Hyperglycemia, unspecified: Secondary | ICD-10-CM | POA: Diagnosis not present

## 2020-03-10 DIAGNOSIS — I10 Essential (primary) hypertension: Secondary | ICD-10-CM | POA: Diagnosis not present

## 2020-03-10 DIAGNOSIS — E782 Mixed hyperlipidemia: Secondary | ICD-10-CM

## 2020-03-10 DIAGNOSIS — Z1159 Encounter for screening for other viral diseases: Secondary | ICD-10-CM | POA: Diagnosis not present

## 2020-03-10 LAB — CBC WITH DIFFERENTIAL/PLATELET
Basophils Absolute: 0 10*3/uL (ref 0.0–0.2)
Basos: 0 %
EOS (ABSOLUTE): 0.1 10*3/uL (ref 0.0–0.4)
Eos: 2 %
Hematocrit: 44.6 % (ref 34.0–46.6)
Hemoglobin: 14.3 g/dL (ref 11.1–15.9)
Immature Grans (Abs): 0 10*3/uL (ref 0.0–0.1)
Immature Granulocytes: 1 %
Lymphocytes Absolute: 2.7 10*3/uL (ref 0.7–3.1)
Lymphs: 44 %
MCH: 28.3 pg (ref 26.6–33.0)
MCHC: 32.1 g/dL (ref 31.5–35.7)
MCV: 88 fL (ref 79–97)
Monocytes Absolute: 0.5 10*3/uL (ref 0.1–0.9)
Monocytes: 8 %
Neutrophils Absolute: 2.7 10*3/uL (ref 1.4–7.0)
Neutrophils: 45 %
Platelets: 203 10*3/uL (ref 150–450)
RBC: 5.06 x10E6/uL (ref 3.77–5.28)
RDW: 13.1 % (ref 11.7–15.4)
WBC: 6 10*3/uL (ref 3.4–10.8)

## 2020-03-10 LAB — LIPID PANEL
Chol/HDL Ratio: 6.3 ratio — ABNORMAL HIGH (ref 0.0–4.4)
Cholesterol, Total: 269 mg/dL — ABNORMAL HIGH (ref 100–199)
HDL: 43 mg/dL (ref >39)
LDL Chol Calc (NIH): 152 mg/dL — ABNORMAL HIGH (ref 0–99)
Triglycerides: 392 mg/dL — ABNORMAL HIGH (ref 0–149)
VLDL Cholesterol Cal: 74 mg/dL — ABNORMAL HIGH (ref 5–40)

## 2020-03-10 LAB — CMP14+EGFR
ALT: 37 IU/L — ABNORMAL HIGH (ref 0–32)
AST: 31 IU/L (ref 0–40)
Albumin/Globulin Ratio: 2 (ref 1.2–2.2)
Albumin: 4.9 g/dL — ABNORMAL HIGH (ref 3.8–4.8)
Alkaline Phosphatase: 130 IU/L — ABNORMAL HIGH (ref 48–121)
BUN/Creatinine Ratio: 23 (ref 12–28)
BUN: 19 mg/dL (ref 8–27)
Bilirubin Total: 0.5 mg/dL (ref 0.0–1.2)
CO2: 23 mmol/L (ref 20–29)
Calcium: 9.8 mg/dL (ref 8.7–10.3)
Chloride: 99 mmol/L (ref 96–106)
Creatinine, Ser: 0.83 mg/dL (ref 0.57–1.00)
GFR calc Af Amer: 85 mL/min/{1.73_m2} (ref >59)
GFR calc non Af Amer: 74 mL/min/{1.73_m2} (ref >59)
Globulin, Total: 2.5 g/dL (ref 1.5–4.5)
Glucose: 130 mg/dL — ABNORMAL HIGH (ref 65–99)
Potassium: 3.8 mmol/L (ref 3.5–5.2)
Sodium: 140 mmol/L (ref 134–144)
Total Protein: 7.4 g/dL (ref 6.0–8.5)

## 2020-03-11 ENCOUNTER — Other Ambulatory Visit: Payer: Self-pay | Admitting: Family

## 2020-03-11 MED ORDER — ATORVASTATIN CALCIUM 20 MG PO TABS
20.0000 mg | ORAL_TABLET | Freq: Every day | ORAL | 3 refills | Status: DC
Start: 2020-03-11 — End: 2020-03-12

## 2020-03-12 ENCOUNTER — Other Ambulatory Visit (HOSPITAL_COMMUNITY)
Admission: RE | Admit: 2020-03-12 | Discharge: 2020-03-12 | Disposition: A | Discharge status: Home / Self Care | Payer: Medicare Other | Source: Ambulatory Visit | Attending: Family | Admitting: Family

## 2020-03-12 ENCOUNTER — Encounter: Payer: Self-pay | Admitting: Family

## 2020-03-12 ENCOUNTER — Ambulatory Visit (INDEPENDENT_AMBULATORY_CARE_PROVIDER_SITE_OTHER): Payer: Medicare Other | Admitting: Family

## 2020-03-12 ENCOUNTER — Other Ambulatory Visit: Payer: Self-pay

## 2020-03-12 VITALS — BP 136/79 | HR 90 | Temp 97.3°F | Ht 61.0 in | Wt 213.2 lb

## 2020-03-12 DIAGNOSIS — Z01419 Encounter for gynecological examination (general) (routine) without abnormal findings: Secondary | ICD-10-CM | POA: Diagnosis present

## 2020-03-12 DIAGNOSIS — Z1159 Encounter for screening for other viral diseases: Secondary | ICD-10-CM

## 2020-03-12 DIAGNOSIS — Z0001 Encounter for general adult medical examination with abnormal findings: Secondary | ICD-10-CM | POA: Diagnosis not present

## 2020-03-12 DIAGNOSIS — F411 Generalized anxiety disorder: Secondary | ICD-10-CM

## 2020-03-12 DIAGNOSIS — E785 Hyperlipidemia, unspecified: Secondary | ICD-10-CM

## 2020-03-12 DIAGNOSIS — E1169 Type 2 diabetes mellitus with other specified complication: Secondary | ICD-10-CM

## 2020-03-12 DIAGNOSIS — Z0000 Encounter for general adult medical examination without abnormal findings: Secondary | ICD-10-CM

## 2020-03-12 DIAGNOSIS — B002 Herpesviral gingivostomatitis and pharyngotonsillitis: Secondary | ICD-10-CM | POA: Insufficient documentation

## 2020-03-12 DIAGNOSIS — F320 Major depressive disorder, single episode, mild: Secondary | ICD-10-CM

## 2020-03-12 DIAGNOSIS — I10 Essential (primary) hypertension: Secondary | ICD-10-CM

## 2020-03-12 DIAGNOSIS — Z01411 Encounter for gynecological examination (general) (routine) with abnormal findings: Secondary | ICD-10-CM

## 2020-03-12 MED ORDER — ATORVASTATIN CALCIUM 20 MG PO TABS: 20.0000 mg | ORAL_TABLET | Freq: Every day | ORAL | 3 refills | Status: DC

## 2020-03-12 NOTE — Progress Notes (Signed)
Subjective:    Patient ID: Danielle Ellis, female    DOB: Dec 30, 1955, 64 y.o.   MRN: [id number]  Chief Complaint  Patient presents with  . Annual Exam    with pap   Pt presents to the office today for CPE with pap.  Diabetes She presents for her follow-up diabetic visit. She has type 2 diabetes mellitus. Her disease course has been stable. Hypoglycemia symptoms include nervousness/anxiousness. Pertinent negatives for diabetes include no blurred vision and no foot paresthesias. Symptoms are stable. Pertinent negatives for diabetic complications include no CVA, heart disease, nephropathy or peripheral neuropathy. Risk factors for coronary artery disease include dyslipidemia, diabetes mellitus, hypertension, post-menopausal and sedentary lifestyle. She is following a generally unhealthy diet. (Does not check regularly ) An ACE inhibitor/angiotensin II receptor blocker is being taken. Eye exam is current.  Hypertension This is a chronic problem. The current episode started more than 1 year ago. The problem has been resolved since onset. The problem is controlled. Associated symptoms include anxiety. Pertinent negatives include no blurred vision, malaise/fatigue, peripheral edema or shortness of breath. Risk factors for coronary artery disease include dyslipidemia, diabetes mellitus, obesity and sedentary lifestyle. The current treatment provides moderate improvement. There is no history of kidney disease or CVA.  Hyperlipidemia This is a chronic problem. The current episode started more than 1 year ago. The problem is uncontrolled. Recent lipid tests were reviewed and are normal. Exacerbating diseases include obesity. Pertinent negatives include no shortness of breath. Current antihyperlipidemic treatment includes statins. The current treatment provides moderate improvement of lipids. Risk factors for coronary artery disease include dyslipidemia, diabetes mellitus, hypertension, a sedentary lifestyle  and post-menopausal.  Depression        This is a chronic problem.  The current episode started more than 1 year ago.   The onset quality is gradual.   The problem occurs intermittently.  Associated symptoms include irritable, restlessness and sad.  Associated symptoms include no helplessness and no hopelessness.  Past treatments include SSRIs - Selective serotonin reuptake inhibitors.  Compliance with treatment is good.  Past medical history includes anxiety.   Anxiety Presents for follow-up visit. Symptoms include depressed mood, irritability, nervous/anxious behavior and restlessness. Patient reports no shortness of breath. Symptoms occur most days. The severity of symptoms is moderate.    Oral Herpes Takes Valtrex as needed.     Review of Systems  Constitutional: Positive for irritability. Negative for malaise/fatigue.  Eyes: Negative for blurred vision.  Respiratory: Negative for shortness of breath.   Psychiatric/Behavioral: Positive for depression. The patient is nervous/anxious.   All other systems reviewed and are negative.  Family History  Problem Relation Age of Onset  . Diabetes Mother   . Kidney disease Mother   . Cancer Mother   . Heart attack Mother    Social History   Socioeconomic History  . Marital status: Married    Spouse name: Not on file  . Number of children: Not on file  . Years of education: Not on file  . Highest education level: Not on file  Occupational History  . Not on file  Tobacco Use  . Smoking status: Never Smoker  . Smokeless tobacco: Never Used  Vaping Use  . Vaping Use: Never used  Substance and Sexual Activity  . Alcohol use: No  . Drug use: No  . Sexual activity: Yes    Partners: Male  Other Topics Concern  . Not on file  Social History Narrative  . Not on  file   Social Determinants of Health   Financial Resource Strain:   . Difficulty of Paying Living Expenses:   Food Insecurity:   . Worried About Programme researcher, broadcasting/film/video in  the Last Year:   . Barista in the Last Year:   Transportation Needs:   . Freight forwarder (Medical):   Marland Kitchen Lack of Transportation (Non-Medical):   Physical Activity:   . Days of Exercise per Week:   . Minutes of Exercise per Session:   Stress:   . Feeling of Stress :   Social Connections:   . Frequency of Communication with Friends and Family:   . Frequency of Social Gatherings with Friends and Family:   . Attends Religious Services:   . Active Member of Clubs or Organizations:   . Attends Banker Meetings:   Marland Kitchen Marital Status:        Objective:   Physical Exam Vitals reviewed.  Constitutional:      General: She is irritable. She is not in acute distress.    Appearance: She is well-developed. She is obese.  HENT:     Head: Normocephalic and atraumatic.     Right Ear: Tympanic membrane normal.     Left Ear: Tympanic membrane normal.  Eyes:     Pupils: Pupils are equal, round, and reactive to light.  Neck:     Thyroid: No thyromegaly.  Cardiovascular:     Rate and Rhythm: Normal rate and regular rhythm.     Heart sounds: Normal heart sounds. No murmur heard.   Pulmonary:     Effort: Pulmonary effort is normal. No respiratory distress.     Breath sounds: Normal breath sounds. No wheezing.  Chest:     Breasts:        Right: No swelling, bleeding, inverted nipple, mass, nipple discharge, skin change or tenderness.        Left: No swelling, bleeding, inverted nipple, mass, nipple discharge, skin change or tenderness.  Abdominal:     General: Bowel sounds are normal. There is no distension.     Palpations: Abdomen is soft.     Tenderness: There is no abdominal tenderness.  Genitourinary:    General: Normal vulva.     Comments: Bimanual exam- no adnexal masses or tenderness, ovaries nonpalpable   Cervix parous and pink- No discharge  Musculoskeletal:        General: No tenderness. Normal range of motion.     Cervical back: Normal range of  motion and neck supple.  Skin:    General: Skin is warm and dry.  Neurological:     Mental Status: She is alert and oriented to person, place, and time.     Cranial Nerves: No cranial nerve deficit.     Deep Tendon Reflexes: Reflexes are normal and symmetric.  Psychiatric:        Behavior: Behavior normal.        Thought Content: Thought content normal.        Judgment: Judgment normal.       BP (!) 136/79   Pulse 90   Temp (!) 97.3 F (36.3 C) (Temporal)   Ht 5\' 1"  (1.549 m)   Wt (!) 213 lb 3.2 oz (96.7 kg)   SpO2 95%   BMI 40.28 kg/m      Assessment & Plan:  Danielle Ellis comes in today with chief complaint of Annual Exam (with pap)   Diagnosis and orders addressed:  1. Annual  physical exam - TSH - VITAMIN D 25 Hydroxy (Vit-D Deficiency, Fractures) - Cytology - PAP([place]) - Hepatitis C antibody  2. Encounter for gynecological examination without abnormal finding - Cytology - PAP()  3. Hypertension, unspecified type  4. Type 2 diabetes mellitus with other specified complication, without long-term current use of insulin (HCC) - Microalbumin / creatinine urine ratio  5. Hyperlipidemia, unspecified hyperlipidemia type - atorvastatin (LIPITOR) 20 MG tablet; Take 1 tablet (20 mg total) by mouth daily.  Dispense: 90 tablet; Refill: 3  6. Depression, major, single episode, mild (HCC)  7. GAD (generalized anxiety disorder)  8. Oral herpes simplex infection  9. Need for hepatitis C screening test - Hepatitis C antibody   Labs pending Health Maintenance reviewed Diet and exercise encouraged  Follow up plan: 6 months    Jannifer Rodney, FNP

## 2020-03-12 NOTE — Patient Instructions (Signed)
Health Maintenance, Female Adopting a healthy lifestyle and getting preventive care are important in promoting health and wellness. Ask your health care provider about:  The right schedule for you to have regular tests and exams.  Things you can do on your own to prevent diseases and keep yourself healthy. What should I know about diet, weight, and exercise? Eat a healthy diet   Eat a diet that includes plenty of vegetables, fruits, low-fat dairy products, and lean protein.  Do not eat a lot of foods that are high in solid fats, added sugars, or sodium. Maintain a healthy weight Body mass index (BMI) is used to identify weight problems. It estimates body fat based on height and weight. Your health care provider can help determine your BMI and help you achieve or maintain a healthy weight. Get regular exercise Get regular exercise. This is one of the most important things you can do for your health. Most adults should:  Exercise for at least 150 minutes each week. The exercise should increase your heart rate and make you sweat (moderate-intensity exercise).  Do strengthening exercises at least twice a week. This is in addition to the moderate-intensity exercise.  Spend less time sitting. Even light physical activity can be beneficial. Watch cholesterol and blood lipids Have your blood tested for lipids and cholesterol at 66 years of age, then have this test every 5 years. Have your cholesterol levels checked more often if:  Your lipid or cholesterol levels are high.  You are older than 66 years of age.  You are at high risk for heart disease. What should I know about cancer screening? Depending on your health history and family history, you may need to have cancer screening at various ages. This may include screening for:  Breast cancer.  Cervical cancer.  Colorectal cancer.  Skin cancer.  Lung cancer. What should I know about heart disease, diabetes, and high blood  pressure? Blood pressure and heart disease  High blood pressure causes heart disease and increases the risk of stroke. This is more likely to develop in people who have high blood pressure readings, are of African descent, or are overweight.  Have your blood pressure checked: ? Every 3-5 years if you are 18-39 years of age. ? Every year if you are 40 years old or older. Diabetes Have regular diabetes screenings. This checks your fasting blood sugar level. Have the screening done:  Once every three years after age 40 if you are at a normal weight and have a low risk for diabetes.  More often and at a younger age if you are overweight or have a high risk for diabetes. What should I know about preventing infection? Hepatitis B If you have a higher risk for hepatitis B, you should be screened for this virus. Talk with your health care provider to find out if you are at risk for hepatitis B infection. Hepatitis C Testing is recommended for:  Everyone born from 1945 through 1965.  Anyone with known risk factors for hepatitis C. Sexually transmitted infections (STIs)  Get screened for STIs, including gonorrhea and chlamydia, if: ? You are sexually active and are younger than 66 years of age. ? You are older than 66 years of age and your health care provider tells you that you are at risk for this type of infection. ? Your sexual activity has changed since you were last screened, and you are at increased risk for chlamydia or gonorrhea. Ask your health care provider if   you are at risk.  Ask your health care provider about whether you are at high risk for HIV. Your health care provider may recommend a prescription medicine to help prevent HIV infection. If you choose to take medicine to prevent HIV, you should first get tested for HIV. You should then be tested every 3 months for as long as you are taking the medicine. Pregnancy  If you are about to stop having your period (premenopausal) and  you may become pregnant, seek counseling before you get pregnant.  Take 400 to 800 micrograms (mcg) of folic acid every day if you become pregnant.  Ask for birth control (contraception) if you want to prevent pregnancy. Osteoporosis and menopause Osteoporosis is a disease in which the bones lose minerals and strength with aging. This can result in bone fractures. If you are 65 years old or older, or if you are at risk for osteoporosis and fractures, ask your health care provider if you should:  Be screened for bone loss.  Take a calcium or vitamin D supplement to lower your risk of fractures.  Be given hormone replacement therapy (HRT) to treat symptoms of menopause. Follow these instructions at home: Lifestyle  Do not use any products that contain nicotine or tobacco, such as cigarettes, e-cigarettes, and chewing tobacco. If you need help quitting, ask your health care provider.  Do not use street drugs.  Do not share needles.  Ask your health care provider for help if you need support or information about quitting drugs. Alcohol use  Do not drink alcohol if: ? Your health care provider tells you not to drink. ? You are pregnant, may be pregnant, or are planning to become pregnant.  If you drink alcohol: ? Limit how much you use to 0-1 drink a day. ? Limit intake if you are breastfeeding.  Be aware of how much alcohol is in your drink. In the [place] one drink equals one 12 oz bottle of beer (355 mL), one 5 oz glass of wine (148 mL), or one 1 oz glass of hard liquor (44 mL). General instructions  Schedule regular health, dental, and eye exams.  Stay current with your vaccines.  Tell your health care provider if: ? You often feel depressed. ? You have ever been abused or do not feel safe at home. Summary  Adopting a healthy lifestyle and getting preventive care are important in promoting health and wellness.  Follow your health care provider's instructions about healthy  diet, exercising, and getting tested or screened for diseases.  Follow your health care provider's instructions on monitoring your cholesterol and blood pressure. This information is not intended to replace advice given to you by your health care provider. Make sure you discuss any questions you have with your health care provider. Document Revised: 07/26/2018 Document Reviewed: 07/26/2018 Elsevier Patient Education  2020 [employer]  

## 2020-03-13 ENCOUNTER — Other Ambulatory Visit: Payer: Self-pay | Admitting: *Deleted

## 2020-03-13 LAB — SPECIMEN STATUS REPORT

## 2020-03-13 LAB — MICROALBUMIN / CREATININE URINE RATIO
Creatinine, Urine: 113 mg/dL
Microalb/Creat Ratio: 5 mg/g creat (ref 0–29)
Microalbumin, Urine: 5.7 ug/mL

## 2020-03-13 LAB — HGB A1C W/O EAG: Hgb A1c MFr Bld: 6.2 % — ABNORMAL HIGH (ref 4.8–5.6)

## 2020-03-13 LAB — CYTOLOGY - PAP: Diagnosis: NEGATIVE

## 2020-03-13 MED ORDER — ESCITALOPRAM OXALATE 5 MG PO TABS: 5.0000 mg | ORAL_TABLET | Freq: Every day | ORAL | 1 refills | Status: DC

## 2020-03-17 ENCOUNTER — Telehealth: Payer: Self-pay | Admitting: Family

## 2020-03-17 MED ORDER — ESCITALOPRAM OXALATE 10 MG PO TABS
10.0000 mg | ORAL_TABLET | Freq: Every day | ORAL | 1 refills | Status: DC
Start: 2020-03-17 — End: 2020-03-18

## 2020-03-17 NOTE — Telephone Encounter (Signed)
I do not see where it was increased- please advise

## 2020-03-17 NOTE — Telephone Encounter (Signed)
Lexapro 10 mg Prescription sent to pharmacy

## 2020-03-18 MED ORDER — ESCITALOPRAM OXALATE 10 MG PO TABS: 10.0000 mg | ORAL_TABLET | Freq: Every day | ORAL | 1 refills | Status: DC

## 2020-03-18 NOTE — Addendum Note (Signed)
Addended by: Caryl Bis on: 03/18/2020 08:18 AM   Modules accepted: Orders

## 2020-03-18 NOTE — Telephone Encounter (Signed)
Patient aware and states that Rx was to be sent to [facility].  Resent rx to correct pharmacy per patient request.  Rx canceled at [facility]

## 2020-03-19 LAB — SPECIMEN STATUS REPORT

## 2020-03-19 LAB — TSH

## 2020-03-19 LAB — VITAMIN D 25 HYDROXY (VIT D DEFICIENCY, FRACTURES): Vit D, 25-Hydroxy: 46.8 ng/mL (ref 30.0–100.0)

## 2020-03-19 LAB — HEPATITIS C ANTIBODY: Hep C Virus Ab: <0.1 s/co ratio (ref 0.0–0.9)

## 2020-05-12 ENCOUNTER — Other Ambulatory Visit: Payer: Self-pay | Admitting: Family

## 2020-05-14 ENCOUNTER — Other Ambulatory Visit: Payer: Self-pay

## 2020-05-14 DIAGNOSIS — R7303 Prediabetes: Secondary | ICD-10-CM

## 2020-05-14 DIAGNOSIS — I10 Essential (primary) hypertension: Secondary | ICD-10-CM

## 2020-05-14 MED ORDER — METFORMIN HCL 500 MG PO TABS: 500.0000 mg | ORAL_TABLET | Freq: Every day | ORAL | 0 refills | Status: DC

## 2020-05-14 MED ORDER — VALSARTAN-HYDROCHLOROTHIAZIDE 160-25 MG PO TABS: 1.0000 | ORAL_TABLET | Freq: Every day | ORAL | 0 refills | Status: DC

## 2020-06-11 DIAGNOSIS — H25819 Combined forms of age-related cataract, unspecified eye: Secondary | ICD-10-CM | POA: Diagnosis not present

## 2020-06-11 DIAGNOSIS — H401131 Primary open-angle glaucoma, bilateral, mild stage: Secondary | ICD-10-CM | POA: Diagnosis not present

## 2020-07-16 ENCOUNTER — Other Ambulatory Visit: Payer: Self-pay | Admitting: Internal Medicine

## 2020-07-16 ENCOUNTER — Other Ambulatory Visit: Payer: Medicare Other

## 2020-07-16 DIAGNOSIS — Z20822 Contact with and (suspected) exposure to covid-19: Secondary | ICD-10-CM | POA: Diagnosis not present

## 2020-07-18 ENCOUNTER — Encounter: Payer: Self-pay | Admitting: Family

## 2020-07-18 ENCOUNTER — Ambulatory Visit (INDEPENDENT_AMBULATORY_CARE_PROVIDER_SITE_OTHER): Payer: Medicare Other | Admitting: Family

## 2020-07-18 DIAGNOSIS — J209 Acute bronchitis, unspecified: Secondary | ICD-10-CM | POA: Diagnosis not present

## 2020-07-18 DIAGNOSIS — R059 Cough, unspecified: Secondary | ICD-10-CM

## 2020-07-18 DIAGNOSIS — J440 Chronic obstructive pulmonary disease with acute lower respiratory infection: Secondary | ICD-10-CM

## 2020-07-18 LAB — SARS-COV-2, NAA 2 DAY TAT

## 2020-07-18 LAB — NOVEL CORONAVIRUS, NAA: SARS-CoV-2, NAA: NOT DETECTED

## 2020-07-18 LAB — SPECIMEN STATUS REPORT

## 2020-07-18 MED ORDER — PREDNISONE 10 MG (21) PO TBPK: ORAL_TABLET | ORAL | 0 refills | Status: DC

## 2020-07-18 MED ORDER — BENZONATATE 200 MG PO CAPS: 200.0000 mg | ORAL_CAPSULE | Freq: Three times a day (TID) | ORAL | 1 refills | Status: DC | PRN

## 2020-07-18 NOTE — Progress Notes (Signed)
Virtual Visit via telephone Note Due to COVID-19 pandemic this visit was conducted virtually. This visit type was conducted due to national recommendations for restrictions regarding the COVID-19 Pandemic (e.g. social distancing, sheltering in place) in an effort to limit this patient's exposure and mitigate transmission in our community. All issues noted in this document were discussed and addressed.  A physical exam was not performed with this format.  I connected with Danielle Ellis on 07/18/20 at 10:57 AM  by telephone and verified that I am speaking with the correct person using two identifiers. Danielle Ellis is currently located at home and husband is currently with no one during visit. The provider, Jannifer Rodney, FNP is located in their office at time of visit.  I discussed the limitations, risks, security and privacy concerns of performing an evaluation and management service by telephone and the availability of in person appointments. I also discussed with the patient that there may be a patient responsible charge related to this service. The patient expressed understanding and agreed to proceed.   History and Present Illness:  Pt calls the office today with cough. She was tested for COVID and it was negative. Cough This is a new problem. The current episode started in the past 7 days. The problem has been waxing and waning. The problem occurs every few minutes. The cough is productive of purulent sputum. Associated symptoms include nasal congestion. Pertinent negatives include no chills, ear congestion, ear pain, fever, headaches, myalgias, sore throat or shortness of breath. She has tried rest (zyrtec ) for the symptoms. The treatment provided mild relief.       Review of Systems  Constitutional: Negative for chills and fever.  HENT: Negative for ear pain and sore throat.   Respiratory: Positive for cough. Negative for shortness of breath.   Musculoskeletal: Negative for  myalgias.  Neurological: Negative for headaches.  All other systems reviewed and are negative.    Observations/Objective: No SOB or distress noted   Assessment and Plan: 1. Cough - predniSONE (STERAPRED UNI-PAK 21 TAB) 10 MG (21) TBPK tablet; Use as directed  Dispense: 21 tablet; Refill: 0 - benzonatate (TESSALON) 200 MG capsule; Take 1 capsule (200 mg total) by mouth 3 (three) times daily as needed.  Dispense: 30 capsule; Refill: 1  2. Acute bronchitis with COPD (HCC) Force fluids  Tylenol as needed  Call if symptoms worsen or do not improve  - predniSONE (STERAPRED UNI-PAK 21 TAB) 10 MG (21) TBPK tablet; Use as directed  Dispense: 21 tablet; Refill: 0 - benzonatate (TESSALON) 200 MG capsule; Take 1 capsule (200 mg total) by mouth 3 (three) times daily as needed.  Dispense: 30 capsule; Refill: 1     I discussed the assessment and treatment plan with the patient. The patient was provided an opportunity to ask questions and all were answered. The patient agreed with the plan and demonstrated an understanding of the instructions.   The patient was advised to call back or seek an in-person evaluation if the symptoms worsen or if the condition fails to improve as anticipated.  The above assessment and management plan was discussed with the patient. The patient verbalized understanding of and has agreed to the management plan. Patient is aware to call the clinic if symptoms persist or worsen. Patient is aware when to return to the clinic for a follow-up visit. Patient educated on when it is appropriate to go to the emergency department.   Time call ended:  11:03 AM  I provided 6 minutes of non-face-to-face time during this encounter.    Jannifer Rodney, FNP

## 2020-08-05 ENCOUNTER — Other Ambulatory Visit: Payer: Self-pay | Admitting: Family

## 2020-09-12 ENCOUNTER — Ambulatory Visit: Payer: Medicare Other | Admitting: Family

## 2020-09-25 ENCOUNTER — Other Ambulatory Visit: Payer: Self-pay | Admitting: Family

## 2020-09-25 DIAGNOSIS — I10 Essential (primary) hypertension: Secondary | ICD-10-CM

## 2020-09-25 DIAGNOSIS — R7303 Prediabetes: Secondary | ICD-10-CM

## 2020-09-26 ENCOUNTER — Other Ambulatory Visit: Payer: Self-pay | Admitting: Obstetrics & Gynecology

## 2020-09-26 DIAGNOSIS — Z1231 Encounter for screening mammogram for malignant neoplasm of breast: Secondary | ICD-10-CM

## 2020-10-03 ENCOUNTER — Ambulatory Visit (INDEPENDENT_AMBULATORY_CARE_PROVIDER_SITE_OTHER): Payer: Medicare Other | Admitting: Family

## 2020-10-03 ENCOUNTER — Other Ambulatory Visit: Payer: Self-pay

## 2020-10-03 ENCOUNTER — Encounter: Payer: Self-pay | Admitting: Family

## 2020-10-03 VITALS — BP 142/72 | HR 75 | Temp 97.1°F | Ht 61.0 in | Wt 212.2 lb

## 2020-10-03 DIAGNOSIS — E785 Hyperlipidemia, unspecified: Secondary | ICD-10-CM | POA: Diagnosis not present

## 2020-10-03 DIAGNOSIS — Z23 Encounter for immunization: Secondary | ICD-10-CM

## 2020-10-03 DIAGNOSIS — F411 Generalized anxiety disorder: Secondary | ICD-10-CM | POA: Diagnosis not present

## 2020-10-03 DIAGNOSIS — R7303 Prediabetes: Secondary | ICD-10-CM | POA: Diagnosis not present

## 2020-10-03 DIAGNOSIS — I10 Essential (primary) hypertension: Secondary | ICD-10-CM

## 2020-10-03 DIAGNOSIS — F320 Major depressive disorder, single episode, mild: Secondary | ICD-10-CM

## 2020-10-03 DIAGNOSIS — J301 Allergic rhinitis due to pollen: Secondary | ICD-10-CM

## 2020-10-03 LAB — CMP14+EGFR
ALT: 41 IU/L — ABNORMAL HIGH (ref 0–32)
AST: 32 IU/L (ref 0–40)
Albumin/Globulin Ratio: 1.9 (ref 1.2–2.2)
Albumin: 4.9 g/dL — ABNORMAL HIGH (ref 3.8–4.8)
Alkaline Phosphatase: 132 IU/L — ABNORMAL HIGH (ref 44–121)
BUN/Creatinine Ratio: 20 (ref 12–28)
BUN: 18 mg/dL (ref 8–27)
Bilirubin Total: 0.5 mg/dL (ref 0.0–1.2)
CO2: 21 mmol/L (ref 20–29)
Calcium: 10.1 mg/dL (ref 8.7–10.3)
Chloride: 99 mmol/L (ref 96–106)
Creatinine, Ser: 0.88 mg/dL (ref 0.57–1.00)
GFR calc Af Amer: 79 mL/min/{1.73_m2} (ref >59)
GFR calc non Af Amer: 69 mL/min/{1.73_m2} (ref >59)
Globulin, Total: 2.6 g/dL (ref 1.5–4.5)
Glucose: 125 mg/dL — ABNORMAL HIGH (ref 65–99)
Potassium: 3.8 mmol/L (ref 3.5–5.2)
Sodium: 139 mmol/L (ref 134–144)
Total Protein: 7.5 g/dL (ref 6.0–8.5)

## 2020-10-03 LAB — CBC WITH DIFFERENTIAL/PLATELET
Basophils Absolute: 0 10*3/uL (ref 0.0–0.2)
Basos: 1 %
EOS (ABSOLUTE): 0.1 10*3/uL (ref 0.0–0.4)
Eos: 1 %
Hematocrit: 44.6 % (ref 34.0–46.6)
Hemoglobin: 14.5 g/dL (ref 11.1–15.9)
Immature Grans (Abs): 0 10*3/uL (ref 0.0–0.1)
Immature Granulocytes: 0 %
Lymphocytes Absolute: 2.4 10*3/uL (ref 0.7–3.1)
Lymphs: 41 %
MCH: 28.4 pg (ref 26.6–33.0)
MCHC: 32.5 g/dL (ref 31.5–35.7)
MCV: 87 fL (ref 79–97)
Monocytes Absolute: 0.5 10*3/uL (ref 0.1–0.9)
Monocytes: 8 %
Neutrophils Absolute: 2.8 10*3/uL (ref 1.4–7.0)
Neutrophils: 49 %
Platelets: 199 10*3/uL (ref 150–450)
RBC: 5.11 x10E6/uL (ref 3.77–5.28)
RDW: 13.5 % (ref 11.7–15.4)
WBC: 5.9 10*3/uL (ref 3.4–10.8)

## 2020-10-03 LAB — BAYER DCA HB A1C WAIVED: HB A1C (BAYER DCA - WAIVED): 5.8 % (ref <7.0)

## 2020-10-03 MED ORDER — VALSARTAN-HYDROCHLOROTHIAZIDE 160-25 MG PO TABS: 1.0000 | ORAL_TABLET | Freq: Every day | ORAL | 0 refills | Status: DC

## 2020-10-03 MED ORDER — METFORMIN HCL 500 MG PO TABS: 500.0000 mg | ORAL_TABLET | Freq: Every day | ORAL | 0 refills | Status: DC

## 2020-10-03 MED ORDER — ROSUVASTATIN CALCIUM 5 MG PO TABS: 5.0000 mg | ORAL_TABLET | ORAL | 3 refills | Status: DC

## 2020-10-03 MED ORDER — ESCITALOPRAM OXALATE 10 MG PO TABS: 10.0000 mg | ORAL_TABLET | Freq: Every day | ORAL | 2 refills | Status: DC

## 2020-10-03 MED ORDER — BACLOFEN 10 MG PO TABS: 10.0000 mg | ORAL_TABLET | Freq: Three times a day (TID) | ORAL | 3 refills | Status: DC

## 2020-10-03 MED ORDER — BLOOD GLUCOSE METER KIT: PACK | 0 refills | Status: DC

## 2020-10-03 NOTE — Progress Notes (Signed)
Subjective:    Patient ID: Danielle Ellis, female    DOB: 03/27/1956, 65 y.o.   MRN: [id number]  Chief Complaint  Patient presents with  . Medical Management of Chronic Issues    6 mth    Pt present to the office today chronic follow up.  Hypertension This is a chronic problem. The current episode started more than 1 year ago. The problem has been waxing and waning since onset. The problem is uncontrolled. Associated symptoms include anxiety and malaise/fatigue. Pertinent negatives include no blurred vision, peripheral edema or shortness of breath. Risk factors for coronary artery disease include dyslipidemia, obesity and sedentary lifestyle. There is no history of CVA or heart failure.  Hyperlipidemia This is a chronic problem. The current episode started more than 1 year ago. The problem is uncontrolled. Exacerbating diseases include obesity. Pertinent negatives include no shortness of breath. Current antihyperlipidemic treatment includes diet change. The current treatment provides mild improvement of lipids. Risk factors for coronary artery disease include dyslipidemia, hypertension, diabetes mellitus and a sedentary lifestyle.  Anxiety Presents for follow-up visit. Symptoms include excessive worry, irritability, nervous/anxious behavior and restlessness. Patient reports no shortness of breath. Symptoms occur most days. The severity of symptoms is moderate.    Depression        This is a chronic problem.  The current episode started more than 1 year ago.   The problem occurs intermittently.  Associated symptoms include irritable, restlessness and sad.  Associated symptoms include no helplessness and no hopelessness.  Past medical history includes anxiety.   Diabetes She presents for her follow-up diabetic visit. She has type 2 diabetes mellitus. Her disease course has been stable. Hypoglycemia symptoms include nervousness/anxiousness. Pertinent negatives for diabetes include no blurred  vision and no foot paresthesias. There are no hypoglycemic complications. Symptoms are stable. There are no diabetic complications. Pertinent negatives for diabetic complications include no CVA. Risk factors for coronary artery disease include dyslipidemia, diabetes mellitus, hypertension and sedentary lifestyle. She is following a generally healthy diet. Her overall blood glucose range is 70-90 mg/dl. An ACE inhibitor/angiotensin II receptor blocker is being taken. Eye exam is current.      Review of Systems  Constitutional: Positive for irritability and malaise/fatigue.  Eyes: Negative for blurred vision.  Respiratory: Negative for shortness of breath.   Psychiatric/Behavioral: Positive for depression. The patient is nervous/anxious.   All other systems reviewed and are negative.      Objective:   Physical Exam Vitals reviewed.  Constitutional:      General: She is irritable. She is not in acute distress.    Appearance: She is well-developed and well-nourished. She is obese.  HENT:     Head: Normocephalic and atraumatic.     Right Ear: Tympanic membrane normal.     Left Ear: Tympanic membrane normal.     Mouth/Throat:     Mouth: Oropharynx is clear and moist.  Eyes:     Pupils: Pupils are equal, round, and reactive to light.  Neck:     Thyroid: No thyromegaly.  Cardiovascular:     Rate and Rhythm: Normal rate and regular rhythm.     Pulses: Intact distal pulses.     Heart sounds: Normal heart sounds. No murmur heard.   Pulmonary:     Effort: Pulmonary effort is normal. No respiratory distress.     Breath sounds: Normal breath sounds. No wheezing.  Abdominal:     General: Bowel sounds are normal. There is no distension.  Palpations: Abdomen is soft.     Tenderness: There is no abdominal tenderness.  Musculoskeletal:        General: No tenderness or edema. Normal range of motion.     Cervical back: Normal range of motion and neck supple.  Skin:    General: Skin is  warm and dry.  Neurological:     Mental Status: She is alert and oriented to person, place, and time.     Cranial Nerves: No cranial nerve deficit.     Deep Tendon Reflexes: Reflexes are normal and symmetric.  Psychiatric:        Mood and Affect: Mood and affect normal.        Behavior: Behavior normal.        Thought Content: Thought content normal.        Judgment: Judgment normal.      BP (!) 142/72   Pulse 75   Temp (!) 97.1 F (36.2 C) (Temporal)   Ht '5\' 1"'  (1.549 m)   Wt 212 lb 3.2 oz (96.3 kg)   BMI 40.09 kg/m      Assessment & Plan:  Danielle Ellis comes in today with chief complaint of Medical Management of Chronic Issues (6 mth )   Diagnosis and orders addressed:  1. Hypertension, unspecified type - valsartan-hydrochlorothiazide (DIOVAN-HCT) 160-25 MG tablet; Take 1 tablet by mouth daily.  Dispense: 90 tablet; Refill: 0 - CMP14+EGFR - CBC with Differential/Platelet  2. Prediabetes -Rx given for glucose meter - metFORMIN (GLUCOPHAGE) 500 MG tablet; Take 1 tablet (500 mg total) by mouth daily with breakfast.  Dispense: 90 tablet; Refill: 0 - CMP14+EGFR - CBC with Differential/Platelet - Bayer DCA Hb A1c Waived - blood glucose meter kit and supplies; Dispense based on patient and insurance preference. Use up to four times daily as directed. (FOR ICD-10 E10.9, E11.9).  Dispense: 1 each; Refill: 0  3. Allergic rhinitis due to pollen, unspecified seasonality - CMP14+EGFR - CBC with Differential/Platelet  4. GAD (generalized anxiety disorder) - escitalopram (LEXAPRO) 10 MG tablet; Take 1 tablet (10 mg total) by mouth daily.  Dispense: 90 tablet; Refill: 2 - CMP14+EGFR - CBC with Differential/Platelet  5. Depression, major, single episode, mild (HCC) - escitalopram (LEXAPRO) 10 MG tablet; Take 1 tablet (10 mg total) by mouth daily.  Dispense: 90 tablet; Refill: 2 - CMP14+EGFR - CBC with Differential/Platelet  6. Hyperlipidemia, unspecified hyperlipidemia  type -Will stop Lipitor and will try Crestor 5 mg every other day. If muscle pain, change to M, W, & F. If still has issues switch to once a week.  - CMP14+EGFR - CBC with Differential/Platelet - rosuvastatin (CRESTOR) 5 MG tablet; Take 1 tablet (5 mg total) by mouth every other day.  Dispense: 45 tablet; Refill: 3  7. Need for immunization against influenza - Flu Vaccine QUAD High Dose(Fluad) - CMP14+EGFR - CBC with Differential/Platelet   Labs pending Health Maintenance reviewed Diet and exercise encouraged  Follow up plan: 4 months    Evelina Dun, FNP

## 2020-10-03 NOTE — Patient Instructions (Signed)
Diabetes Mellitus and Nutrition, Adult When you have diabetes, or diabetes mellitus, it is very important to have healthy eating habits because your blood sugar (glucose) levels are greatly affected by what you eat and drink. Eating healthy foods in the right amounts, at about the same times every day, can help you:  Control your blood glucose.  Lower your risk of heart disease.  Improve your blood pressure.  Reach or maintain a healthy weight. What can affect my meal plan? Every person with diabetes is different, and each person has different needs for a meal plan. Your health care provider may recommend that you work with a dietitian to make a meal plan that is best for you. Your meal plan may vary depending on factors such as:  The calories you need.  The medicines you take.  Your weight.  Your blood glucose, blood pressure, and cholesterol levels.  Your activity level.  Other health conditions you have, such as heart or kidney disease. How do carbohydrates affect me? Carbohydrates, also called carbs, affect your blood glucose level more than any other type of food. Eating carbs naturally raises the amount of glucose in your blood. Carb counting is a method for keeping track of how many carbs you eat. Counting carbs is important to keep your blood glucose at a healthy level, especially if you use insulin or take certain oral diabetes medicines. It is important to know how many carbs you can safely have in each meal. This is different for every person. Your dietitian can help you calculate how many carbs you should have at each meal and for each snack. How does alcohol affect me? Alcohol can cause a sudden decrease in blood glucose (hypoglycemia), especially if you use insulin or take certain oral diabetes medicines. Hypoglycemia can be a life-threatening condition. Symptoms of hypoglycemia, such as sleepiness, dizziness, and confusion, are similar to symptoms of having too much  alcohol.  Do not drink alcohol if: ? Your health care provider tells you not to drink. ? You are pregnant, may be pregnant, or are planning to become pregnant.  If you drink alcohol: ? Do not drink on an empty stomach. ? Limit how much you use to:  0-1 drink a day for women.  0-2 drinks a day for men. ? Be aware of how much alcohol is in your drink. In the [place] one drink equals one 12 oz bottle of beer (355 mL), one 5 oz glass of wine (148 mL), or one 1 oz glass of hard liquor (44 mL). ? Keep yourself hydrated with water, diet soda, or unsweetened iced tea.  Keep in mind that regular soda, juice, and other mixers may contain a lot of sugar and must be counted as carbs. What are tips for following this plan? Reading food labels  Start by checking the serving size on the "Nutrition Facts" label of packaged foods and drinks. The amount of calories, carbs, fats, and other nutrients listed on the label is based on one serving of the item. Many items contain more than one serving per package.  Check the total grams (g) of carbs in one serving. You can calculate the number of servings of carbs in one serving by dividing the total carbs by 15. For example, if a food has 30 g of total carbs per serving, it would be equal to 2 servings of carbs.  Check the number of grams (g) of saturated fats and trans fats in one serving. Choose foods that have   a low amount or none of these fats.  Check the number of milligrams (mg) of salt (sodium) in one serving. Most people should limit total sodium intake to less than 2,300 mg per day.  Always check the nutrition information of foods labeled as "low-fat" or "nonfat." These foods may be higher in added sugar or refined carbs and should be avoided.  Talk to your dietitian to identify your daily goals for nutrients listed on the label. Shopping  Avoid buying canned, pre-made, or processed foods. These foods tend to be high in fat, sodium, and added  sugar.  Shop around the outside edge of the grocery store. This is where you will most often find fresh fruits and vegetables, bulk grains, fresh meats, and fresh dairy. Cooking  Use low-heat cooking methods, such as baking, instead of high-heat cooking methods like deep frying.  Cook using healthy oils, such as olive, canola, or sunflower oil.  Avoid cooking with butter, cream, or high-fat meats. Meal planning  Eat meals and snacks regularly, preferably at the same times every day. Avoid going long periods of time without eating.  Eat foods that are high in fiber, such as fresh fruits, vegetables, beans, and whole grains. Talk with your dietitian about how many servings of carbs you can eat at each meal.  Eat 4-6 oz (112-168 g) of lean protein each day, such as lean meat, chicken, fish, eggs, or tofu. One ounce (oz) of lean protein is equal to: ? 1 oz (28 g) of meat, chicken, or fish. ? 1 egg. ?  cup (62 g) of tofu.  Eat some foods each day that contain healthy fats, such as avocado, nuts, seeds, and fish.   What foods should I eat? Fruits Berries. Apples. Oranges. Peaches. Apricots. Plums. Grapes. Mango. Papaya. Pomegranate. Kiwi. Cherries. Vegetables Lettuce. Spinach. Leafy greens, including kale, chard, collard greens, and mustard greens. Beets. Cauliflower. Cabbage. Broccoli. Carrots. Green beans. Tomatoes. Peppers. Onions. Cucumbers. Brussels sprouts. Grains Whole grains, such as whole-wheat or whole-grain bread, crackers, tortillas, cereal, and pasta. Unsweetened oatmeal. Quinoa. Brown or wild rice. Meats and other proteins Seafood. Poultry without skin. Lean cuts of poultry and beef. Tofu. Nuts. Seeds. Dairy Low-fat or fat-free dairy products such as milk, yogurt, and cheese. The items listed above may not be a complete list of foods and beverages you can eat. Contact a dietitian for more information. What foods should I avoid? Fruits Fruits canned with  syrup. Vegetables Canned vegetables. Frozen vegetables with butter or cream sauce. Grains Refined white flour and flour products such as bread, pasta, snack foods, and cereals. Avoid all processed foods. Meats and other proteins Fatty cuts of meat. Poultry with skin. Breaded or fried meats. Processed meat. Avoid saturated fats. Dairy Full-fat yogurt, cheese, or milk. Beverages Sweetened drinks, such as soda or iced tea. The items listed above may not be a complete list of foods and beverages you should avoid. Contact a dietitian for more information. Questions to ask a health care provider  Do I need to meet with a diabetes educator?  Do I need to meet with a dietitian?  What number can I call if I have questions?  When are the best times to check my blood glucose? Where to find more information:  American Diabetes Association: diabetes.org  Academy of Nutrition and Dietetics: www.eatright.org  National Institute of Diabetes and Digestive and Kidney Diseases: www.niddk.nih.gov  Association of Diabetes Care and Education Specialists: www.diabeteseducator.org Summary  It is important to have healthy eating   habits because your blood sugar (glucose) levels are greatly affected by what you eat and drink.  A healthy meal plan will help you control your blood glucose and maintain a healthy lifestyle.  Your health care provider may recommend that you work with a dietitian to make a meal plan that is best for you.  Keep in mind that carbohydrates (carbs) and alcohol have immediate effects on your blood glucose levels. It is important to count carbs and to use alcohol carefully. This information is not intended to replace advice given to you by your health care provider. Make sure you discuss any questions you have with your health care provider. Document Revised: 07/10/2019 Document Reviewed: 07/10/2019 Elsevier Patient Education  2021 [employer]  

## 2020-10-06 ENCOUNTER — Other Ambulatory Visit: Payer: Self-pay | Admitting: Family

## 2020-10-06 DIAGNOSIS — R748 Abnormal levels of other serum enzymes: Secondary | ICD-10-CM

## 2020-10-08 ENCOUNTER — Other Ambulatory Visit: Payer: Self-pay

## 2020-10-08 ENCOUNTER — Telehealth: Payer: Self-pay

## 2020-10-08 ENCOUNTER — Encounter: Payer: Self-pay | Admitting: Nurse Practitioner

## 2020-10-08 ENCOUNTER — Ambulatory Visit (INDEPENDENT_AMBULATORY_CARE_PROVIDER_SITE_OTHER): Payer: Medicare Other | Admitting: Nurse Practitioner

## 2020-10-08 VITALS — BP 151/80 | HR 84 | Temp 98.1°F | Ht 61.0 in | Wt 210.0 lb

## 2020-10-08 DIAGNOSIS — R079 Chest pain, unspecified: Secondary | ICD-10-CM | POA: Diagnosis not present

## 2020-10-08 MED ORDER — NITROGLYCERIN 0.4 MG SL SUBL: 0.4000 mg | SUBLINGUAL_TABLET | SUBLINGUAL | 3 refills | Status: DC | PRN

## 2020-10-08 NOTE — Progress Notes (Signed)
Acute Office Visit  Subjective:    Patient ID: Danielle Ellis, female    DOB: 03/18/1956, 65 y.o.   MRN: [id number]  Chief Complaint  Patient presents with  . Chest Pain    Chest Pain  This is a new problem. The current episode started today. The onset quality is sudden. The problem has been resolved. The pain is present in the substernal region. The pain is moderate. The quality of the pain is described as pressure. The pain does not radiate. Associated symptoms include exertional chest pressure. Pertinent negatives include no abdominal pain, back pain, cough, irregular heartbeat, nausea, shortness of breath, syncope or vomiting. The pain is aggravated by nothing. She has tried nothing for the symptoms. Risk factors include obesity.  Her past medical history is significant for hypertension.     Past Medical History:  Diagnosis Date  . Anxiety   . Hypertension   . Sleep apnea     Past Surgical History:  Procedure Laterality Date  . APPENDECTOMY    . L ankle surgery      Family History  Problem Relation Age of Onset  . Diabetes Mother   . Kidney disease Mother   . Cancer Mother   . Heart attack Mother     Social History   Socioeconomic History  . Marital status: Married    Spouse name: Not on file  . Number of children: Not on file  . Years of education: Not on file  . Highest education level: Not on file  Occupational History  . Not on file  Tobacco Use  . Smoking status: Never Smoker  . Smokeless tobacco: Never Used  Vaping Use  . Vaping Use: Never used  Substance and Sexual Activity  . Alcohol use: No  . Drug use: No  . Sexual activity: Yes    Partners: Male  Other Topics Concern  . Not on file  Social History Narrative  . Not on file   Social Determinants of Health   Financial Resource Strain: Not on file  Food Insecurity: Not on file  Transportation Needs: Not on file  Physical Activity: Not on file  Stress: Not on file  Social  Connections: Not on file  Intimate Partner Violence: Not on file    Outpatient Medications Prior to Visit  Medication Sig Dispense Refill  . baclofen (LIORESAL) 10 MG tablet Take 1 tablet (10 mg total) by mouth 3 (three) times daily. 90 tablet 3  . blood glucose meter kit and supplies Dispense based on patient and insurance preference. Use up to four times daily as directed. (FOR ICD-10 E10.9, E11.9). 1 each 0  . calcium gluconate 500 MG tablet Take 500 mg by mouth daily.    Marland Kitchen escitalopram (LEXAPRO) 10 MG tablet Take 1 tablet (10 mg total) by mouth daily. 90 tablet 2  . latanoprost (XALATAN) 0.005 % ophthalmic solution     . metFORMIN (GLUCOPHAGE) 500 MG tablet Take 1 tablet (500 mg total) by mouth daily with breakfast. 90 tablet 0  . Multiple Vitamin (MULTIVITAMIN) capsule Take 1 capsule by mouth daily.    . Multiple Vitamins-Minerals (MULTIVITAMIN WITH MINERALS) tablet Take 1 tablet by mouth daily.    . Omega-3 Fatty Acids (FISH OIL) 1000 MG CAPS Take by mouth.    . rosuvastatin (CRESTOR) 5 MG tablet Take 1 tablet (5 mg total) by mouth every other day. 45 tablet 3  . valACYclovir (VALTREX) 1000 MG tablet TAKE 2 TABLETS BY MOUTH  TWICE  DAILY 12 tablet 1  . valsartan-hydrochlorothiazide (DIOVAN-HCT) 160-25 MG tablet Take 1 tablet by mouth daily. 90 tablet 0   No facility-administered medications prior to visit.    No Known Allergies  Review of Systems  Constitutional: Negative.   HENT: Negative.   Respiratory: Negative for cough and shortness of breath.   Cardiovascular: Positive for chest pain. Negative for syncope.  Gastrointestinal: Negative for abdominal pain, nausea and vomiting.  Genitourinary: Negative.   Musculoskeletal: Negative for back pain.  Skin: Negative.   All other systems reviewed and are negative.      Objective:    Physical Exam Constitutional:      Appearance: She is well-developed.  HENT:     Nose: Nose normal.  Eyes:     Conjunctiva/sclera:  Conjunctivae normal.  Cardiovascular:     Rate and Rhythm: Normal rate and regular rhythm.     Pulses: Normal pulses.     Heart sounds: Normal heart sounds.     Comments: Chest tightness Pulmonary:     Effort: Pulmonary effort is normal.     Breath sounds: Normal breath sounds.  Abdominal:     General: Bowel sounds are normal.  Musculoskeletal:        General: Normal range of motion.     Cervical back: Normal range of motion.  Neurological:     Mental Status: She is alert and oriented to person, place, and time.     BP (!) 151/80   Pulse 84   Temp 98.1 F (36.7 C)   Ht '5\' 1"'  (1.549 m)   Wt 210 lb (95.3 kg)   SpO2 97%   BMI 39.68 kg/m  Wt Readings from Last 3 Encounters:  10/08/20 210 lb (95.3 kg)  10/03/20 212 lb 3.2 oz (96.3 kg)  03/12/20 (!) 213 lb 3.2 oz (96.7 kg)    Health Maintenance Due  Topic Date Due  . TETANUS/TDAP  Never done    There are no preventive care reminders to display for this patient.   Lab Results  Component Value Date   TSH CANCELED 03/10/2020   Lab Results  Component Value Date   WBC 5.9 10/03/2020   HGB 14.5 10/03/2020   HCT 44.6 10/03/2020   MCV 87 10/03/2020   PLT 199 10/03/2020   Lab Results  Component Value Date   NA 139 10/03/2020   K 3.8 10/03/2020   CO2 21 10/03/2020   GLUCOSE 125 (H) 10/03/2020   BUN 18 10/03/2020   CREATININE 0.88 10/03/2020   BILITOT 0.5 10/03/2020   ALKPHOS 132 (H) 10/03/2020   AST 32 10/03/2020   ALT 41 (H) 10/03/2020   PROT 7.5 10/03/2020   ALBUMIN 4.9 (H) 10/03/2020   CALCIUM 10.1 10/03/2020   Lab Results  Component Value Date   CHOL 269 (H) 03/10/2020   Lab Results  Component Value Date   HDL 43 03/10/2020   Lab Results  Component Value Date   LDLCALC 152 (H) 03/10/2020   Lab Results  Component Value Date   TRIG 392 (H) 03/10/2020   Lab Results  Component Value Date   CHOLHDL 6.3 (H) 03/10/2020   Lab Results  Component Value Date   HGBA1C 5.8 10/03/2020        Assessment & Plan:   Problem List Items Addressed This Visit      Other   Chest pain - Primary    About 8 AM today patient experienced chest pain and tightness after preparing breakfast for her husband  patient had not had any of her blood pressure medication, patient called 911, upon arrival twelve-lead EKG was completed with normal sinus rhythm.  Patient was advised to go to the emergency department and follow-up later with primary care provider.  Patient reports calling the emergency department and was told an 8-hour wait was expected so patient declined visit and did not go to the ED.  Patient is being seen today for evaluation of the same chest tightness almost 8 hours after initial incidence.  Patient is not reporting any fever, nausea, dizziness, headache or chest pain.  Repeated EKG, troponin labs ordered, started patient on nitroglycerin.  Education provided to patient with printed handouts given.  Rx sent to pharmacy  Advised patient to follow-up with worsening unresolved symptoms and seek emergency care after hours for unresolved chest pain.      Relevant Medications   nitroGLYCERIN (NITROSTAT) 0.4 MG SL tablet   Other Relevant Orders   EKG 12-Lead (Completed)   Troponin T, STAT ([facility])       Meds ordered this encounter  Medications  . nitroGLYCERIN (NITROSTAT) 0.4 MG SL tablet    Sig: Place 1 tablet (0.4 mg total) under the tongue every 5 (five) minutes as needed for chest pain.    Dispense:  50 tablet    Refill:  3    Order Specific Question:   Supervising Provider    Answer:   Janora Norlander [[id number]]     Ivy Lynn, NP

## 2020-10-08 NOTE — Patient Instructions (Signed)

## 2020-10-08 NOTE — Assessment & Plan Note (Signed)
About 8 AM today patient experienced chest pain and tightness after preparing breakfast for her husband patient had not had any of her blood pressure medication, patient called 911, upon arrival twelve-lead EKG was completed with normal sinus rhythm.  Patient was advised to go to the emergency department and follow-up later with primary care provider.  Patient reports calling the emergency department and was told an 8-hour wait was expected so patient declined visit and did not go to the ED.  Patient is being seen today for evaluation of the same chest tightness almost 8 hours after initial incidence.  Patient is not reporting any fever, nausea, dizziness, headache or chest pain.  Repeated EKG, troponin labs ordered, started patient on nitroglycerin.  Education provided to patient with printed handouts given.  Rx sent to pharmacy  Advised patient to follow-up with worsening unresolved symptoms and seek emergency care after hours for unresolved chest pain.

## 2020-10-09 ENCOUNTER — Other Ambulatory Visit: Payer: Self-pay

## 2020-10-09 ENCOUNTER — Telehealth: Payer: Self-pay

## 2020-10-09 ENCOUNTER — Other Ambulatory Visit: Payer: Self-pay | Admitting: Nurse Practitioner

## 2020-10-09 ENCOUNTER — Encounter (HOSPITAL_COMMUNITY): Payer: Self-pay

## 2020-10-09 ENCOUNTER — Emergency Department (HOSPITAL_COMMUNITY): Payer: Medicare Other

## 2020-10-09 ENCOUNTER — Inpatient Hospital Stay (HOSPITAL_COMMUNITY)
Admission: EM | Admit: 2020-10-09 | Discharge: 2020-10-19 | DRG: 234 | Disposition: A | Discharge status: Home / Self Care | Payer: Medicare Other | Attending: Surgery | Admitting: Surgery

## 2020-10-09 DIAGNOSIS — J9811 Atelectasis: Secondary | ICD-10-CM | POA: Diagnosis not present

## 2020-10-09 DIAGNOSIS — G4733 Obstructive sleep apnea (adult) (pediatric): Secondary | ICD-10-CM | POA: Diagnosis not present

## 2020-10-09 DIAGNOSIS — F411 Generalized anxiety disorder: Secondary | ICD-10-CM | POA: Diagnosis present

## 2020-10-09 DIAGNOSIS — E8881 Metabolic syndrome: Secondary | ICD-10-CM | POA: Diagnosis present

## 2020-10-09 DIAGNOSIS — D62 Acute posthemorrhagic anemia: Secondary | ICD-10-CM | POA: Diagnosis not present

## 2020-10-09 DIAGNOSIS — F418 Other specified anxiety disorders: Secondary | ICD-10-CM | POA: Diagnosis present

## 2020-10-09 DIAGNOSIS — Z01818 Encounter for other preprocedural examination: Secondary | ICD-10-CM | POA: Diagnosis not present

## 2020-10-09 DIAGNOSIS — E8770 Fluid overload, unspecified: Secondary | ICD-10-CM | POA: Diagnosis not present

## 2020-10-09 DIAGNOSIS — Z20822 Contact with and (suspected) exposure to covid-19: Secondary | ICD-10-CM | POA: Diagnosis not present

## 2020-10-09 DIAGNOSIS — Z841 Family history of disorders of kidney and ureter: Secondary | ICD-10-CM | POA: Diagnosis not present

## 2020-10-09 DIAGNOSIS — J90 Pleural effusion, not elsewhere classified: Secondary | ICD-10-CM | POA: Diagnosis not present

## 2020-10-09 DIAGNOSIS — E785 Hyperlipidemia, unspecified: Secondary | ICD-10-CM | POA: Diagnosis not present

## 2020-10-09 DIAGNOSIS — Z7984 Long term (current) use of oral hypoglycemic drugs: Secondary | ICD-10-CM | POA: Diagnosis not present

## 2020-10-09 DIAGNOSIS — I214 Non-ST elevation (NSTEMI) myocardial infarction: Secondary | ICD-10-CM | POA: Diagnosis not present

## 2020-10-09 DIAGNOSIS — F419 Anxiety disorder, unspecified: Secondary | ICD-10-CM | POA: Diagnosis present

## 2020-10-09 DIAGNOSIS — Z8249 Family history of ischemic heart disease and other diseases of the circulatory system: Secondary | ICD-10-CM | POA: Diagnosis not present

## 2020-10-09 DIAGNOSIS — Z6839 Body mass index (BMI) 39.0-39.9, adult: Secondary | ICD-10-CM

## 2020-10-09 DIAGNOSIS — E669 Obesity, unspecified: Secondary | ICD-10-CM | POA: Diagnosis present

## 2020-10-09 DIAGNOSIS — I10 Essential (primary) hypertension: Secondary | ICD-10-CM | POA: Diagnosis present

## 2020-10-09 DIAGNOSIS — Z01810 Encounter for preprocedural cardiovascular examination: Secondary | ICD-10-CM

## 2020-10-09 DIAGNOSIS — Z833 Family history of diabetes mellitus: Secondary | ICD-10-CM | POA: Diagnosis not present

## 2020-10-09 DIAGNOSIS — R6889 Other general symptoms and signs: Secondary | ICD-10-CM | POA: Diagnosis not present

## 2020-10-09 DIAGNOSIS — E876 Hypokalemia: Secondary | ICD-10-CM | POA: Diagnosis present

## 2020-10-09 DIAGNOSIS — I2510 Atherosclerotic heart disease of native coronary artery without angina pectoris: Secondary | ICD-10-CM | POA: Diagnosis not present

## 2020-10-09 DIAGNOSIS — I25110 Atherosclerotic heart disease of native coronary artery with unstable angina pectoris: Secondary | ICD-10-CM | POA: Diagnosis not present

## 2020-10-09 DIAGNOSIS — Z743 Need for continuous supervision: Secondary | ICD-10-CM | POA: Diagnosis not present

## 2020-10-09 DIAGNOSIS — E119 Type 2 diabetes mellitus without complications: Secondary | ICD-10-CM

## 2020-10-09 DIAGNOSIS — R079 Chest pain, unspecified: Secondary | ICD-10-CM

## 2020-10-09 DIAGNOSIS — Z79899 Other long term (current) drug therapy: Secondary | ICD-10-CM | POA: Diagnosis not present

## 2020-10-09 DIAGNOSIS — I252 Old myocardial infarction: Secondary | ICD-10-CM | POA: Diagnosis not present

## 2020-10-09 DIAGNOSIS — E782 Mixed hyperlipidemia: Secondary | ICD-10-CM | POA: Diagnosis not present

## 2020-10-09 DIAGNOSIS — R0789 Other chest pain: Secondary | ICD-10-CM | POA: Diagnosis not present

## 2020-10-09 DIAGNOSIS — I499 Cardiac arrhythmia, unspecified: Secondary | ICD-10-CM | POA: Diagnosis not present

## 2020-10-09 DIAGNOSIS — I517 Cardiomegaly: Secondary | ICD-10-CM | POA: Diagnosis not present

## 2020-10-09 DIAGNOSIS — J939 Pneumothorax, unspecified: Secondary | ICD-10-CM

## 2020-10-09 DIAGNOSIS — Z951 Presence of aortocoronary bypass graft: Secondary | ICD-10-CM

## 2020-10-09 LAB — CBC
HCT: 43.3 % (ref 36.0–46.0)
Hemoglobin: 14 g/dL (ref 12.0–15.0)
MCH: 28.7 pg (ref 26.0–34.0)
MCHC: 32.3 g/dL (ref 30.0–36.0)
MCV: 88.7 fL (ref 80.0–100.0)
Platelets: 220 10*3/uL (ref 150–400)
RBC: 4.88 MIL/uL (ref 3.87–5.11)
RDW: 13.3 % (ref 11.5–15.5)
WBC: 7.8 10*3/uL (ref 4.0–10.5)
nRBC: 0 % (ref 0.0–0.2)

## 2020-10-09 LAB — CBG MONITORING, ED: Glucose-Capillary: 133 mg/dL — ABNORMAL HIGH (ref 70–99)

## 2020-10-09 LAB — TROPONIN I (HIGH SENSITIVITY)
Troponin I (High Sensitivity): 188 ng/L (ref <18)
Troponin I (High Sensitivity): 156 ng/L (ref <18)

## 2020-10-09 LAB — RESP PANEL BY RT-PCR (FLU A&B, COVID) ARPGX2
Influenza A by PCR: NEGATIVE
Influenza B by PCR: NEGATIVE
SARS Coronavirus 2 by RT PCR: NEGATIVE

## 2020-10-09 LAB — TROPONIN T: Troponin T (Highly Sensitive): 130 ng/L (ref 0–14)

## 2020-10-09 LAB — BASIC METABOLIC PANEL
Anion gap: 14 (ref 5–15)
BUN: 18 mg/dL (ref 8–23)
CO2: 22 mmol/L (ref 22–32)
Calcium: 9.4 mg/dL (ref 8.9–10.3)
Chloride: 101 mmol/L (ref 98–111)
Creatinine, Ser: 0.87 mg/dL (ref 0.44–1.00)
GFR, Estimated: >60 mL/min (ref >60)
Glucose, Bld: 119 mg/dL — ABNORMAL HIGH (ref 70–99)
Potassium: 3.2 mmol/L — ABNORMAL LOW (ref 3.5–5.1)
Sodium: 137 mmol/L (ref 135–145)

## 2020-10-09 MED ORDER — NITROGLYCERIN 0.4 MG SL SUBL
0.4000 mg | SUBLINGUAL_TABLET | SUBLINGUAL | Status: DC | PRN
  Administered 2020-10-11: 0.4 mg via SUBLINGUAL
  Filled 2020-10-09: qty 1

## 2020-10-09 MED ORDER — HEPARIN (PORCINE) 25000 UT/250ML-% IV SOLN
1000.0000 [IU]/h | INTRAVENOUS | Status: DC
  Administered 2020-10-09: 1000 [IU]/h via INTRAVENOUS
  Filled 2020-10-09: qty 250

## 2020-10-09 MED ORDER — ONDANSETRON HCL 4 MG/2ML IJ SOLN: 4.0000 mg | Freq: Four times a day (QID) | INTRAMUSCULAR | Status: DC | PRN

## 2020-10-09 MED ORDER — ACETAMINOPHEN 325 MG PO TABS
650.0000 mg | ORAL_TABLET | ORAL | Status: DC | PRN
  Administered 2020-10-10: 650 mg via ORAL
  Filled 2020-10-09: qty 2

## 2020-10-09 MED ORDER — ASPIRIN EC 81 MG PO TBEC
81.0000 mg | DELAYED_RELEASE_TABLET | Freq: Every day | ORAL | Status: DC
  Administered 2020-10-10 – 2020-10-13 (×4): 81 mg via ORAL
  Filled 2020-10-09 (×3): qty 1

## 2020-10-09 MED ORDER — ATORVASTATIN CALCIUM 80 MG PO TABS: 80.0000 mg | ORAL_TABLET | Freq: Every day | ORAL | Status: DC

## 2020-10-09 MED ORDER — HEPARIN BOLUS VIA INFUSION
4000.0000 [IU] | Freq: Once | INTRAVENOUS | Status: AC
  Administered 2020-10-09: 4000 [IU] via INTRAVENOUS

## 2020-10-09 MED ORDER — NITROGLYCERIN 2 % TD OINT
1.0000 [in_us] | TOPICAL_OINTMENT | Freq: Once | TRANSDERMAL | Status: AC
  Administered 2020-10-09: 1 [in_us] via TOPICAL
  Filled 2020-10-09: qty 1

## 2020-10-09 MED ORDER — ASPIRIN 300 MG RE SUPP: 300.0000 mg | RECTAL | Status: AC

## 2020-10-09 MED ORDER — ASPIRIN 81 MG PO CHEW: 324.0000 mg | CHEWABLE_TABLET | ORAL | Status: AC

## 2020-10-09 NOTE — Progress Notes (Signed)
ANTICOAGULATION CONSULT NOTE - Initial Consult  Pharmacy Consult for heparin gtt  Indication: chest pain/ACS  No Known Allergies  Patient Measurements: Height: 5\' 1"  (154.9 cm) Weight: 95.3 kg (210 lb 1.6 oz) IBW/kg (Calculated) : 47.8 Heparin Dosing Weight: HEPARIN DW (KG): 70.4   Vital Signs: Temp: 98.1 F (36.7 C) (02/24 1850) Temp Source: Oral (02/24 1850) BP: 158/102 (02/24 1849) Pulse Rate: 88 (02/24 1850)  Labs: No results for input(s): HGB, HCT, PLT, APTT, LABPROT, INR, HEPARINUNFRC, HEPRLOWMOCWT, CREATININE, CKTOTAL, CKMB, TROPONINI in the last 72 hours.  Estimated Creatinine Clearance: 66.3 mL/min (by C-G formula based on SCr of 0.88 mg/dL).   Medical History: Past Medical History:  Diagnosis Date  . Anxiety   . Hypertension   . Sleep apnea     Medications:  (Not in a hospital admission)  Scheduled:  . heparin  4,000 Units Intravenous Once  . nitroGLYCERIN  1 inch Topical Once   Infusions:  . heparin     PRN:  Anti-infectives (From admission, onward)   None      Assessment: Danielle Ellis a 65 y.o. female requires anticoagulation with a heparin iv infusion for the indication of  chest pain/ACS. Heparin gtt will be started following pharmacy protocol per pharmacy consult. Patient is not on previous oral anticoagulant that will require aPTT/HL correlation before transitioning to only HL monitoring.   Goal of Therapy:  Heparin level 0.3-0.7 units/ml Monitor platelets by anticoagulation protocol: Yes   Plan:  Give 4000 units bolus x 1 Start heparin infusion at 1000 units/hr Check anti-Xa level in 6 hours and daily while on heparin Continue to monitor H&H and platelets  71 Dareon Nunziato 10/09/2020,7:15 PM

## 2020-10-09 NOTE — ED Notes (Signed)
Date and time results received: 10/09/20 2025 (use smartphrase ".now" to insert current time)  Test: troponin Critical Value: 188  Name of Provider Notified: Hyman Hopes, [place]  Orders Received? Or Actions Taken?: Actions Taken: no orders received

## 2020-10-09 NOTE — ED Triage Notes (Signed)
Chest heaviness that started yesterday. Saw PCP yesterday and they drew her troponin and it was elevated. Has appointment with cards but continues to have chest pain today. Took 2 SL ntg and 4 baby ASA PTA.

## 2020-10-09 NOTE — Telephone Encounter (Signed)
If she has any chest pain remember you have your nitrostat and the emergency department. The cardiology appointment was a STAT order but okay

## 2020-10-09 NOTE — ED Notes (Signed)
NTG paste placed on chest.

## 2020-10-09 NOTE — ED Provider Notes (Signed)
This patient is a 65 year old female, history of hypertension on medications, hypercholesterolemia on medicines, borderline diabetic non-smoker, she has a high BMI.  She presents to the hospital with a complaint of chest pain with some numbness in her left arm which occurred yesterday, she did not want to come to the hospital but did call 911 and when the ambulance got there they did an EKG, recommended that she come but she refused because she was scared of COVID-19.  She then went to her doctor's office where they drew blood including a troponin.  Today she was called and told that the troponin was elevated at 100 and that she needed to be seen by a cardiologist.  They made an appointment for her but because of the chest pain that came back this afternoon she decided to come to the hospital instead.  At this time the patient is chest pain-free, she has no shortness of breath nausea or diaphoresis.  Her arm feels normal.  On exam the patient has clear heart and lung sounds, she does not appear to be in any distress, she has no edema, no asymmetry of the legs, her exam is unremarkable.  EKG was performed October 09, 2020 at 6:48 PM, please see interpretation below.  There is no signs of ST elevation  With her elevated troponin and the signs and symptoms of unstable angina or an acute coronary syndrome which with an elevated troponin would be a non-ST elevation MI she will need heparin, nitroglycerin, aspirin and to be admitted to the hospital.  She had already taken aspirin prehospital  EKG performed at 648 today, shows normal sinus rhythm, normal axis, low voltage, normal intervals, nonspecific T wave flattening especially in the precordial leads V3 through V6, no ST elevation, subtle ST abnormality in lead I and aVL with some mild depression impression abnormal EKG  Medical screening examination/treatment/procedure(s) were conducted as a shared visit with non-physician practitioner(s) and myself.  I  personally evaluated the patient during the encounter.  Clinical Impression:   Final diagnoses:  NSTEMI (non-ST elevated myocardial infarction) (HCC)         Eber Hong, MD 10/11/20 [phone]

## 2020-10-09 NOTE — H&P (Signed)
Cardiology Admission History and Physical:   Patient ID: TALEIGHA PINSON MRN: [id number]; DOB: [birth year]   Admission date: 10/09/2020  PCP:  [name], [place]  Cardiologist:  No primary care provider on file.  Advanced Practice Provider:  No care team member to display Electrophysiologist:  None       Chief Complaint:  CP/NSTEMI  Patient Profile:   Danielle Ellis is a 65 y.o. female with h/o HTN, dyslipidemia, borderline diabetes but no prior h/o CAD presents to [facility] ED c/o CP, found to have abnormal initial HS trop 188.  History of Present Illness:   Ms. Tarry has the above history and presented to the [facility] ED earlier this evening after having 1-2 days of chest discomfort. She describes a midsternal chest discomfort that radiated into L arm, with associated L arm numbness that initially started yesterday. No associated SOB, diaphoresis, nausea, palpitations, or any other significant associated sx. Apparently she did not want to come to the ED due to fears regarding COVID. She went to PCP's office, and HS trop was drawn; she was called and told that was elevated at 100. She was advised to see a cardiologist. The pain escalated later today and was persistent, so she decided to come to the ED for further evaluation. HS found to be 188 in the [facility] ED.   Past Medical History:  Diagnosis Date  . Anxiety   . Hypertension   . Sleep apnea     Past Surgical History:  Procedure Laterality Date  . APPENDECTOMY    . L ankle surgery       Medications Prior to Admission: Prior to Admission medications   Medication Sig Start Date End Date Taking? Authorizing Provider  calcium gluconate 500 MG tablet Take 500 mg by mouth daily.   Yes [provider]  escitalopram (LEXAPRO) 10 MG tablet Take 1 tablet (10 mg total) by mouth daily. 10/03/20  Yes Hawks, Christy A, FNP  latanoprost (XALATAN) 0.005 % ophthalmic solution Place 1 drop into both  eyes at bedtime. 02/12/20  Yes [provider]  metFORMIN (GLUCOPHAGE) 500 MG tablet Take 1 tablet (500 mg total) by mouth daily with breakfast. 10/03/20  Yes Hawks, Christy A, FNP  Multiple Vitamin (MULTIVITAMIN) capsule Take 1 capsule by mouth daily.   Yes [provider]  nitroGLYCERIN (NITROSTAT) 0.4 MG SL tablet Place 1 tablet (0.4 mg total) under the tongue every 5 (five) minutes as needed for chest pain. 10/08/20  Yes Ivy Lynn, NP  rosuvastatin (CRESTOR) 5 MG tablet Take 1 tablet (5 mg total) by mouth every other day. 10/03/20  Yes Hawks, Alyse Low A, FNP  valACYclovir (VALTREX) 1000 MG tablet TAKE 2 TABLETS BY MOUTH  TWICE DAILY 05/13/20  Yes Hawks, Christy A, FNP  valsartan-hydrochlorothiazide (DIOVAN-HCT) 160-25 MG tablet Take 1 tablet by mouth daily. 10/03/20  Yes Hawks, Christy A, FNP  baclofen (LIORESAL) 10 MG tablet Take 1 tablet (10 mg total) by mouth 3 (three) times daily. Patient not taking: No sig reported 10/03/20   [name], FNP  blood glucose meter kit and supplies Dispense based on patient and insurance preference. Use up to four times daily as directed. (FOR ICD-10 E10.9, E11.9). 10/03/20   [name], FNP  Multiple Vitamins-Minerals (MULTIVITAMIN WITH MINERALS) tablet Take 1 tablet by mouth daily. Patient not taking: No sig reported    [provider]  Omega-3 Fatty Acids (FISH OIL) 1000 MG CAPS  Take by mouth. Patient not taking: No sig reported    [provider]     Allergies:   No Known Allergies  Social History:   Social History   Socioeconomic History  . Marital status: Married    Spouse name: Not on file  . Number of children: Not on file  . Years of education: Not on file  . Highest education level: Not on file  Occupational History  . Not on file  Tobacco Use  . Smoking status: Never Smoker  . Smokeless tobacco: Never Used  Vaping Use  . Vaping Use: Never used  Substance and Sexual Activity  . Alcohol  use: No  . Drug use: No  . Sexual activity: Yes    Partners: Male  Other Topics Concern  . Not on file  Social History Narrative  . Not on file   Social Determinants of Health   Financial Resource Strain: Not on file  Food Insecurity: Not on file  Transportation Needs: Not on file  Physical Activity: Not on file  Stress: Not on file  Social Connections: Not on file  Intimate Partner Violence: Not on file    Family History:   The patient's family history includes Cancer in her mother; Diabetes in her mother; Heart attack in her mother; Kidney disease in her mother.    ROS:  Please see the history of present illness.  All other ROS reviewed and negative.     Physical Exam/Data:   Vitals:   10/09/20 1900 10/09/20 1930 10/09/20 2030 10/09/20 2115  BP: (!) 181/85 (!) 162/87 (!) 148/78   Pulse: 97 79 86   Resp: (!) 25 (!) 23  14  Temp:      TempSrc:      SpO2: 95% 96%  96%  Weight:      Height:       No intake or output data in the 24 hours ending 10/09/20 2155 Last 3 Weights 10/09/2020 10/08/2020 10/03/2020  Weight (lbs) 210 lb 1.6 oz 210 lb 212 lb 3.2 oz  Weight (kg) 95.3 kg 95.255 kg 96.253 kg     Body mass index is 39.7 kg/m.  General:  Well nourished, well developed, in no acute distress HEENT: normal Lymph: no adenopathy Neck: no JVD Endocrine:  No thryomegaly Vascular: No carotid bruits; DP pulses 2+ bilaterally  Cardiac:  normal S1, S2; RRR; no murmur  Lungs:  clear to auscultation bilaterally, no wheezing, rhonchi or rales  Abd: soft, nontender, no hepatomegaly  Ext: no edema Musculoskeletal:  No deformities Skin: warm and dry  Neuro:  no focal abnormalities noted Psych:  Normal affect    EKG:  The ECG that was done 10-09-20 was personally reviewed and demonstrates NSR with nonspecific ST changes  Relevant CV Studies: none  Laboratory Data:  High Sensitivity Troponin:   Recent Labs  Lab 10/09/20 1848  TROPONINIHS 188*      Chemistry Recent  Labs  Lab 10/03/20 0833 10/09/20 1848  NA 139 137  K 3.8 3.2*  CL 99 101  CO2 21 22  GLUCOSE 125* 119*  BUN 18 18  CREATININE 0.88 0.87  CALCIUM 10.1 9.4  GFRNONAA 69 >60  GFRAA 79  --   ANIONGAP  --  14    Recent Labs  Lab 10/03/20 0833  PROT 7.5  ALBUMIN 4.9*  AST 32  ALT 41*  ALKPHOS 132*  BILITOT 0.5   Hematology Recent Labs  Lab 10/03/20 0833 10/09/20 1848  WBC  5.9 7.8  RBC 5.11 4.88  HGB 14.5 14.0  HCT 44.6 43.3  MCV 87 88.7  MCH 28.4 28.7  MCHC 32.5 32.3  RDW 13.5 13.3  PLT 199 220   BNPNo results for input(s): BNP, PROBNP in the last 168 hours.  DDimer No results for input(s): DDIMER in the last 168 hours.   Radiology/Studies:  DG Chest Portable 1 View  Result Date: 10/09/2020 CLINICAL DATA:  Chest pain EXAM: PORTABLE CHEST 1 VIEW COMPARISON:  None. FINDINGS: The heart size and mediastinal contours are within normal limits. Mild aortic atherosclerosis. Both lungs are clear. The visualized skeletal structures are unremarkable. IMPRESSION: No active disease. Electronically Signed   By: Donavan Foil M.D.   On: 10/09/2020 19:27     Assessment and Plan:   1. CP/NSTEMI: will cont to cycle trops, cont heparin IV gtt. Pt is currently pain-free. Will plan for elective LHC in the AM. TSH, A1c, FLP for screening. Will get TTE for baseline 2. HTN/dyslipidemia: cont home regimen and adjust PRN 3. DM/insulin resistance: will hold metformin in advance of LHC and dye load. Will order sliding scale insulin   Risk Assessment/Risk Scores:     TIMI Risk Score for Unstable Angina or Non-ST Elevation MI:   The patient's TIMI risk score is 4, which indicates a 20% risk of all cause mortality, new or recurrent myocardial infarction or need for urgent revascularization in the next 14 days.     For questions or updates, please contact [place] Please consult [email or web address] for contact info under     Signed, Rudean Curt, MD, [facility]  10/09/2020 9:55 PM

## 2020-10-09 NOTE — Telephone Encounter (Signed)
Pt called to let PCP know that she is scheduled to see Cardiologist across from [facility] on 10/13/20.

## 2020-10-09 NOTE — ED Notes (Signed)
Updated daughter [name]

## 2020-10-09 NOTE — ED Provider Notes (Addendum)
[facility] EMERGENCY DEPARTMENT Provider Note   CSN: [id number] Arrival date & time: 10/09/20  1830     History Chief Complaint  Patient presents with   Chest Pain    Danielle Ellis is a 65 y.o. female with PMHx HTN and anxiety who presents to the ED today with complaint of gradual onset, intermittent, substernal chest pain that has been intermittent for the past several weeks however more severe yesterday. Pt reports she was eating breakfast yesterday morning when she got up and began experiencing pain radiating into her left arm. She called EMS who came and did an EKG with no acute ischemic changes. Pt reports she was told it would likely be an 7-8 hour wait in the ED and so she decided not to come and to see her PCP instead. She saw them yesterday who drew a troponin level and sent her home. The troponin returned elevated today at 130; her PCP scheduled her an outpatient appointment with a cardiologist for next week. Pt reports that while talking about her elevated troponin level she began having chest pain again prompting her to call 911. She was prescribed NTG at PCP's office yesterday and advised to take if chest pain returned. She took 2 NTG and 324 mg ASA with complete relief of her pain and was brought to the ED for further evaluation. Pt denies hx of MI in the past. + Fhx CAD. Pt is a never smoker. No hx DVT/PE. No other complaints at this time.   The history is provided by the patient, medical records and the EMS personnel.       Past Medical History:  Diagnosis Date   Anxiety    Hypertension    Sleep apnea     Patient Active Problem List   Diagnosis Date Noted   NSTEMI (non-ST elevated myocardial infarction) ([place]) 10/09/2020   Chest pain 10/08/2020   Hyperlipemia 03/12/2020   Depression, major, single episode, mild ([place]) 03/12/2020   GAD (generalized anxiety disorder) 03/12/2020   Oral herpes simplex infection 03/12/2020   Hypertension 04/23/2019    Flexural eczema 04/23/2019   Prediabetes 04/23/2019   Allergic rhinitis due to pollen 04/23/2019    Past Surgical History:  Procedure Laterality Date   APPENDECTOMY     L ankle surgery       OB History   No obstetric history on file.     Family History  Problem Relation Age of Onset   Diabetes Mother    Kidney disease Mother    Cancer Mother    Heart attack Mother     Social History   Tobacco Use   Smoking status: Never Smoker   Smokeless tobacco: Never Used  Vaping Use   Vaping Use: Never used  Substance Use Topics   Alcohol use: No   Drug use: No    Home Medications Prior to Admission medications   Medication Sig Start Date End Date Taking? Authorizing Provider  calcium gluconate 500 MG tablet Take 500 mg by mouth daily.   Yes [provider]  escitalopram (LEXAPRO) 10 MG tablet Take 1 tablet (10 mg total) by mouth daily. 10/03/20  Yes Hawks, Christy A, FNP  latanoprost (XALATAN) 0.005 % ophthalmic solution Place 1 drop into both eyes at bedtime. 02/12/20  Yes [provider]  metFORMIN (GLUCOPHAGE) 500 MG tablet Take 1 tablet (500 mg total) by mouth daily with breakfast. 10/03/20  Yes Hawks, Christy A, FNP  Multiple Vitamin (MULTIVITAMIN) capsule Take 1 capsule by  mouth daily.   Yes [provider]  nitroGLYCERIN (NITROSTAT) 0.4 MG SL tablet Place 1 tablet (0.4 mg total) under the tongue every 5 (five) minutes as needed for chest pain. 10/08/20  Yes Ivy Lynn, NP  rosuvastatin (CRESTOR) 5 MG tablet Take 1 tablet (5 mg total) by mouth every other day. 10/03/20  Yes Hawks, Alyse Low A, FNP  valACYclovir (VALTREX) 1000 MG tablet TAKE 2 TABLETS BY MOUTH  TWICE DAILY 05/13/20  Yes Hawks, Christy A, FNP  valsartan-hydrochlorothiazide (DIOVAN-HCT) 160-25 MG tablet Take 1 tablet by mouth daily. 10/03/20  Yes Hawks, Christy A, FNP  baclofen (LIORESAL) 10 MG tablet Take 1 tablet (10 mg total) by mouth 3 (three) times daily. Patient not  taking: No sig reported 10/03/20   [name], FNP  blood glucose meter kit and supplies Dispense based on patient and insurance preference. Use up to four times daily as directed. (FOR ICD-10 E10.9, E11.9). 10/03/20   [name], FNP  Multiple Vitamins-Minerals (MULTIVITAMIN WITH MINERALS) tablet Take 1 tablet by mouth daily. Patient not taking: No sig reported    [provider]  Omega-3 Fatty Acids (FISH OIL) 1000 MG CAPS Take by mouth. Patient not taking: No sig reported    [provider]    Allergies    Patient has no known allergies.  Review of Systems   Review of Systems  Constitutional: Negative for chills, diaphoresis and fever.  Respiratory: Negative for shortness of breath.   Cardiovascular: Positive for chest pain. Negative for palpitations and leg swelling.  Gastrointestinal: Negative for nausea and vomiting.  All other systems reviewed and are negative.   Physical Exam Updated Vital Signs BP (!) 158/102    Pulse 88    Temp 98.1 F (36.7 C) (Oral)    Resp 17    Ht '5\' 1"'  (1.549 m)    Wt 95.3 kg    SpO2 97%    BMI 39.70 kg/m   Physical Exam Vitals and nursing note reviewed.  Constitutional:      Appearance: She is obese. She is not ill-appearing.  HENT:     Head: Normocephalic and atraumatic.  Eyes:     Conjunctiva/sclera: Conjunctivae normal.  Cardiovascular:     Rate and Rhythm: Normal rate and regular rhythm.     Pulses:          Radial pulses are 2+ on the right side and 2+ on the left side.       Dorsalis pedis pulses are 2+ on the right side and 2+ on the left side.  Pulmonary:     Effort: Pulmonary effort is normal.     Breath sounds: Normal breath sounds. No decreased breath sounds, wheezing, rhonchi or rales.  Abdominal:     Palpations: Abdomen is soft.     Tenderness: There is no abdominal tenderness. There is no guarding or rebound.  Musculoskeletal:     Cervical back: Neck supple.     Right lower leg: No edema.      Left lower leg: No edema.  Skin:    General: Skin is warm and dry.  Neurological:     Mental Status: She is alert.     ED Results / Procedures / Treatments   Labs (all labs ordered are listed, but only abnormal results are displayed) Labs Reviewed  BASIC METABOLIC PANEL - Abnormal; Notable for the following components:      Result Value   Potassium 3.2 (*)    Glucose,  Bld 119 (*)    All other components within normal limits  TROPONIN I (HIGH SENSITIVITY) - Abnormal; Notable for the following components:   Troponin I (High Sensitivity) 188 (*)    All other components within normal limits  RESP PANEL BY RT-PCR (FLU A&B, COVID) ARPGX2  CBC  HEPARIN LEVEL (UNFRACTIONATED)  HEPARIN LEVEL (UNFRACTIONATED)  CBC  CBG MONITORING, ED  TROPONIN I (HIGH SENSITIVITY)    EKG None EKG performed at 648 today, shows normal sinus rhythm, normal axis, low voltage, normal intervals, nonspecific T wave flattening especially in the precordial leads V3 through V6, no ST elevation, subtle ST abnormality in lead I and aVL with some mild depression impression abnormal EKG  Radiology DG Chest Portable 1 View  Result Date: 10/09/2020 CLINICAL DATA:  Chest pain EXAM: PORTABLE CHEST 1 VIEW COMPARISON:  None. FINDINGS: The heart size and mediastinal contours are within normal limits. Mild aortic atherosclerosis. Both lungs are clear. The visualized skeletal structures are unremarkable. IMPRESSION: No active disease. Electronically Signed   By: Donavan Foil M.D.   On: 10/09/2020 19:27    Procedures .Critical Care Performed by: Eustaquio Maize, PA-C Authorized by: Eustaquio Maize, PA-C   Critical care provider statement:    Critical care time (minutes):  45   Critical care was necessary to treat or prevent imminent or life-threatening deterioration of the following conditions:  Cardiac failure   Critical care was time spent personally by me on the following activities:  Discussions with consultants,  evaluation of patient's response to treatment, examination of patient, ordering and performing treatments and interventions, ordering and review of laboratory studies, ordering and review of radiographic studies, pulse oximetry, re-evaluation of patient's condition, obtaining history from patient or surrogate and review of old charts     Medications Ordered in ED Medications  heparin ADULT infusion 100 units/mL (25000 units/252m) (1,000 Units/hr Intravenous New Bag/Given 10/09/20 1928)  nitroGLYCERIN (NITROGLYN) 2 % ointment 1 inch (1 inch Topical Given 10/09/20 1918)  heparin bolus via infusion 4,000 Units (4,000 Units Intravenous Bolus from Bag 10/09/20 1929)    ED Course  I have reviewed the triage vital signs and the nursing notes.  Pertinent labs & imaging results that were available during my care of the patient were reviewed by me and considered in my medical decision making (see chart for details).  Clinical Course as of 10/09/20 2107  Thu Oct 09, 2020  2029 Troponin I (High Sensitivity)(!!): 188 [MV]    Clinical Course User Index [MV] VEustaquio Maize [place]  MDM Rules/Calculators/A&P                          65year old female presents to the ED today with chest pain and, has been having intermittent chest pain for the past several weeks however worsened yesterday.  Had an elevated troponin drawn at her PCPs office with a level of 130 yesterday, was called today with lab value.  Had an appointment scheduled for cardiology next week however began having chest pain while discussing the lab value prompting her to call EMS.  She took 2 nitroglycerin and 324 mg aspirin prior to arrival with complete resolution of her chest pain.  On arrival to the ED vitals are stable.  Patient is afebrile, nontachycardic and nontachypneic and appears to be in no acute distress at this time.  She does report that the pain has been radiating down her left arm for the past 2 days.  Her  symptoms do sound  concerning for ACS.  She will need admission at this time.  An EKG was obtained did not show any acute ischemic changes at this time.  Attending physician Dr. Sabra Heck evaluated patient as well and recommends Nitropaste and heparin with admission.  Will work-up for NSTEMI at this time and plan to admit.  CXR clear CBC without leukocytosis. Hgb stable at 14.0 BMP with potassium 3.2. No other electrolyte abnormalities  Troponin has returned elevated at 188. Will consult cardiology at this time to admit for NSTEMI.   Discussed case with Dr. Hassell Done cardiology who recommends transfer to [facility] with plan for cath likely tomorrow. Will admit to cardiology service. Does not recommend any other intervention at this time. I have placed temporary admission orders with Dr. Tish Men admitting per Dr. Hassell Done.   This note was prepared using Dragon voice recognition software and may include unintentional dictation errors due to the inherent limitations of voice recognition software.  Final Clinical Impression(s) / ED Diagnoses Final diagnoses:  NSTEMI (non-ST elevated myocardial infarction) [facility]    Rx / [place] Orders ED Discharge Orders    None           Eustaquio Maize, Hershal Coria 10/09/20 2119    Noemi Chapel, MD 10/11/20 [id number]

## 2020-10-10 ENCOUNTER — Encounter (HOSPITAL_COMMUNITY): Payer: Self-pay | Admitting: Cardiology

## 2020-10-10 ENCOUNTER — Inpatient Hospital Stay (HOSPITAL_COMMUNITY): Payer: Medicare Other

## 2020-10-10 ENCOUNTER — Other Ambulatory Visit: Payer: Self-pay

## 2020-10-10 ENCOUNTER — Encounter (HOSPITAL_COMMUNITY)
Admission: EM | Disposition: A | Discharge status: Home / Self Care | Payer: Self-pay | Source: Home / Self Care | Attending: Surgery

## 2020-10-10 DIAGNOSIS — I214 Non-ST elevation (NSTEMI) myocardial infarction: Secondary | ICD-10-CM

## 2020-10-10 DIAGNOSIS — E782 Mixed hyperlipidemia: Secondary | ICD-10-CM | POA: Diagnosis not present

## 2020-10-10 DIAGNOSIS — E119 Type 2 diabetes mellitus without complications: Secondary | ICD-10-CM

## 2020-10-10 DIAGNOSIS — I10 Essential (primary) hypertension: Secondary | ICD-10-CM

## 2020-10-10 DIAGNOSIS — I2510 Atherosclerotic heart disease of native coronary artery without angina pectoris: Secondary | ICD-10-CM

## 2020-10-10 DIAGNOSIS — I25110 Atherosclerotic heart disease of native coronary artery with unstable angina pectoris: Secondary | ICD-10-CM

## 2020-10-10 HISTORY — PX: LEFT HEART CATH AND CORONARY ANGIOGRAPHY: CATH118249

## 2020-10-10 LAB — BASIC METABOLIC PANEL
Anion gap: 18 — ABNORMAL HIGH (ref 5–15)
Anion gap: 16 — ABNORMAL HIGH (ref 5–15)
Anion gap: 17 — ABNORMAL HIGH (ref 5–15)
BUN: 16 mg/dL (ref 8–23)
BUN: 17 mg/dL (ref 8–23)
BUN: 16 mg/dL (ref 8–23)
CO2: 22 mmol/L (ref 22–32)
CO2: 22 mmol/L (ref 22–32)
CO2: 20 mmol/L — ABNORMAL LOW (ref 22–32)
Calcium: 9.7 mg/dL (ref 8.9–10.3)
Calcium: 9.6 mg/dL (ref 8.9–10.3)
Calcium: 9.7 mg/dL (ref 8.9–10.3)
Chloride: 101 mmol/L (ref 98–111)
Chloride: 102 mmol/L (ref 98–111)
Chloride: 103 mmol/L (ref 98–111)
Creatinine, Ser: 0.91 mg/dL (ref 0.44–1.00)
Creatinine, Ser: 0.88 mg/dL (ref 0.44–1.00)
Creatinine, Ser: 0.94 mg/dL (ref 0.44–1.00)
GFR, Estimated: >60 mL/min (ref >60)
GFR, Estimated: >60 mL/min (ref >60)
GFR, Estimated: >60 mL/min (ref >60)
Glucose, Bld: 149 mg/dL — ABNORMAL HIGH (ref 70–99)
Glucose, Bld: 107 mg/dL — ABNORMAL HIGH (ref 70–99)
Glucose, Bld: 107 mg/dL — ABNORMAL HIGH (ref 70–99)
Potassium: 2.8 mmol/L — ABNORMAL LOW (ref 3.5–5.1)
Potassium: 3.1 mmol/L — ABNORMAL LOW (ref 3.5–5.1)
Potassium: 3.6 mmol/L (ref 3.5–5.1)
Sodium: 141 mmol/L (ref 135–145)
Sodium: 140 mmol/L (ref 135–145)
Sodium: 140 mmol/L (ref 135–145)

## 2020-10-10 LAB — LIPID PANEL
Cholesterol: 272 mg/dL — ABNORMAL HIGH (ref 0–200)
HDL: 33 mg/dL — ABNORMAL LOW (ref >40)
LDL Cholesterol: 182 mg/dL — ABNORMAL HIGH (ref 0–99)
Total CHOL/HDL Ratio: 8.2 RATIO
Triglycerides: 285 mg/dL — ABNORMAL HIGH (ref <150)
VLDL: 57 mg/dL — ABNORMAL HIGH (ref 0–40)

## 2020-10-10 LAB — ECHOCARDIOGRAM COMPLETE
AR max vel: 2.37 cm2
AV Area VTI: 2.19 cm2
AV Area mean vel: 2.32 cm2
AV Mean grad: 5 mmHg
AV Peak grad: 8.1 mmHg
Ao pk vel: 1.42 m/s
Area-P 1/2: 3.89 cm2
Height: 61 in
MV VTI: 2.54 cm2
S' Lateral: 2.1 cm
Weight: 3281.6 oz

## 2020-10-10 LAB — GLUCOSE, CAPILLARY
Glucose-Capillary: 108 mg/dL — ABNORMAL HIGH (ref 70–99)
Glucose-Capillary: 116 mg/dL — ABNORMAL HIGH (ref 70–99)
Glucose-Capillary: 121 mg/dL — ABNORMAL HIGH (ref 70–99)
Glucose-Capillary: 140 mg/dL — ABNORMAL HIGH (ref 70–99)
Glucose-Capillary: 99 mg/dL (ref 70–99)

## 2020-10-10 LAB — CBC
HCT: 42.6 % (ref 36.0–46.0)
Hemoglobin: 13.9 g/dL (ref 12.0–15.0)
MCH: 28.4 pg (ref 26.0–34.0)
MCHC: 32.6 g/dL (ref 30.0–36.0)
MCV: 87.1 fL (ref 80.0–100.0)
Platelets: 215 10*3/uL (ref 150–400)
RBC: 4.89 MIL/uL (ref 3.87–5.11)
RDW: 13.2 % (ref 11.5–15.5)
WBC: 8.4 10*3/uL (ref 4.0–10.5)
nRBC: 0 % (ref 0.0–0.2)

## 2020-10-10 LAB — POCT ACTIVATED CLOTTING TIME: Activated Clotting Time: 464 seconds

## 2020-10-10 LAB — HEPARIN LEVEL (UNFRACTIONATED)
Heparin Unfractionated: 0.28 IU/mL — ABNORMAL LOW (ref 0.30–0.70)
Heparin Unfractionated: 0.34 IU/mL (ref 0.30–0.70)

## 2020-10-10 LAB — TROPONIN I (HIGH SENSITIVITY)
Troponin I (High Sensitivity): 217 ng/L (ref <18)
Troponin I (High Sensitivity): 195 ng/L (ref <18)

## 2020-10-10 LAB — HIV ANTIBODY (ROUTINE TESTING W REFLEX): HIV Screen 4th Generation wRfx: NONREACTIVE

## 2020-10-10 LAB — PROTIME-INR
INR: 1 (ref 0.8–1.2)
Prothrombin Time: 13 seconds (ref 11.4–15.2)

## 2020-10-10 LAB — HEMOGLOBIN A1C
Hgb A1c MFr Bld: 6.2 % — ABNORMAL HIGH (ref 4.8–5.6)
Mean Plasma Glucose: 131.24 mg/dL

## 2020-10-10 LAB — TSH: TSH: 4.842 u[IU]/mL — ABNORMAL HIGH (ref 0.350–4.500)

## 2020-10-10 SURGERY — LEFT HEART CATH AND CORONARY ANGIOGRAPHY
Anesthesia: LOCAL

## 2020-10-10 MED ORDER — INSULIN ASPART 100 UNIT/ML ~~LOC~~ SOLN
0.0000 [IU] | Freq: Three times a day (TID) | SUBCUTANEOUS | Status: DC
  Administered 2020-10-10: 2 [IU] via SUBCUTANEOUS
  Administered 2020-10-11: 3 [IU] via SUBCUTANEOUS
  Administered 2020-10-12 – 2020-10-13 (×2): 2 [IU] via SUBCUTANEOUS

## 2020-10-10 MED ORDER — PERFLUTREN LIPID MICROSPHERE
1.0000 mL | INTRAVENOUS | Status: AC | PRN
  Administered 2020-10-10: 2 mL via INTRAVENOUS
  Filled 2020-10-10: qty 10

## 2020-10-10 MED ORDER — HEPARIN SODIUM (PORCINE) 1000 UNIT/ML IJ SOLN
INTRAMUSCULAR | Status: DC | PRN
  Administered 2020-10-10 (×2): 5000 [IU] via INTRAVENOUS

## 2020-10-10 MED ORDER — SODIUM CHLORIDE 0.9 % WEIGHT BASED INFUSION
3.0000 mL/kg/h | INTRAVENOUS | Status: DC
  Administered 2020-10-10: 3 mL/kg/h via INTRAVENOUS

## 2020-10-10 MED ORDER — VALSARTAN-HYDROCHLOROTHIAZIDE 160-25 MG PO TABS: 1.0000 | ORAL_TABLET | Freq: Every day | ORAL | Status: DC

## 2020-10-10 MED ORDER — HEPARIN (PORCINE) 25000 UT/250ML-% IV SOLN
1250.0000 [IU]/h | INTRAVENOUS | Status: DC
  Administered 2020-10-10: 1000 [IU]/h via INTRAVENOUS
  Administered 2020-10-11 – 2020-10-13 (×3): 1250 [IU]/h via INTRAVENOUS
  Filled 2020-10-10 (×5): qty 250

## 2020-10-10 MED ORDER — CALCIUM CARBONATE 1250 (500 CA) MG PO TABS
1.0000 | ORAL_TABLET | Freq: Every day | ORAL | Status: DC
  Administered 2020-10-11 – 2020-10-19 (×8): 500 mg via ORAL
  Filled 2020-10-10 (×9): qty 1

## 2020-10-10 MED ORDER — POTASSIUM CHLORIDE CRYS ER 20 MEQ PO TBCR: 20.0000 meq | EXTENDED_RELEASE_TABLET | Freq: Once | ORAL | Status: DC

## 2020-10-10 MED ORDER — LIDOCAINE HCL (PF) 1 % IJ SOLN
INTRAMUSCULAR | Status: AC
  Filled 2020-10-10: qty 30

## 2020-10-10 MED ORDER — ESCITALOPRAM OXALATE 10 MG PO TABS
10.0000 mg | ORAL_TABLET | Freq: Every day | ORAL | Status: DC
  Administered 2020-10-10 – 2020-10-19 (×9): 10 mg via ORAL
  Filled 2020-10-10 (×9): qty 1

## 2020-10-10 MED ORDER — SODIUM CHLORIDE 0.9 % WEIGHT BASED INFUSION
1.0000 mL/kg/h | INTRAVENOUS | Status: AC
  Administered 2020-10-10: 1 mL/kg/h via INTRAVENOUS

## 2020-10-10 MED ORDER — FENTANYL CITRATE (PF) 100 MCG/2ML IJ SOLN
INTRAMUSCULAR | Status: DC | PRN
  Administered 2020-10-10: 25 ug via INTRAVENOUS

## 2020-10-10 MED ORDER — MIDAZOLAM HCL 2 MG/2ML IJ SOLN
INTRAMUSCULAR | Status: AC
  Filled 2020-10-10: qty 2

## 2020-10-10 MED ORDER — SODIUM CHLORIDE 0.9% FLUSH
3.0000 mL | INTRAVENOUS | Status: DC | PRN
Start: 2020-10-10 — End: 2020-10-14

## 2020-10-10 MED ORDER — NITROGLYCERIN 1 MG/10 ML FOR IR/CATH LAB
INTRA_ARTERIAL | Status: DC | PRN
  Administered 2020-10-10: 200 ug

## 2020-10-10 MED ORDER — POTASSIUM CHLORIDE CRYS ER 20 MEQ PO TBCR
40.0000 meq | EXTENDED_RELEASE_TABLET | Freq: Once | ORAL | Status: AC
  Administered 2020-10-10: 40 meq via ORAL
  Filled 2020-10-10: qty 2

## 2020-10-10 MED ORDER — VERAPAMIL HCL 2.5 MG/ML IV SOLN
INTRAVENOUS | Status: AC
  Filled 2020-10-10: qty 2

## 2020-10-10 MED ORDER — NITROGLYCERIN 1 MG/10 ML FOR IR/CATH LAB
INTRA_ARTERIAL | Status: AC
  Filled 2020-10-10: qty 10

## 2020-10-10 MED ORDER — CALCIUM GLUCONATE 500 MG PO TABS
500.0000 mg | ORAL_TABLET | Freq: Every day | ORAL | Status: DC
  Filled 2020-10-10: qty 1

## 2020-10-10 MED ORDER — ADULT MULTIVITAMIN W/MINERALS CH
1.0000 | ORAL_TABLET | Freq: Every day | ORAL | Status: DC
  Administered 2020-10-10 – 2020-10-19 (×9): 1 via ORAL
  Filled 2020-10-10 (×9): qty 1

## 2020-10-10 MED ORDER — ROSUVASTATIN CALCIUM 5 MG PO TABS
5.0000 mg | ORAL_TABLET | ORAL | Status: DC
  Administered 2020-10-10 – 2020-10-19 (×5): 5 mg via ORAL
  Filled 2020-10-10 (×6): qty 1

## 2020-10-10 MED ORDER — INSULIN ASPART 100 UNIT/ML ~~LOC~~ SOLN: 0.0000 [IU] | Freq: Every day | SUBCUTANEOUS | Status: DC

## 2020-10-10 MED ORDER — MIDAZOLAM HCL 2 MG/2ML IJ SOLN
INTRAMUSCULAR | Status: DC | PRN
  Administered 2020-10-10: 2 mg via INTRAVENOUS

## 2020-10-10 MED ORDER — SODIUM CHLORIDE 0.9% FLUSH
3.0000 mL | Freq: Two times a day (BID) | INTRAVENOUS | Status: DC
  Administered 2020-10-11 – 2020-10-12 (×5): 3 mL via INTRAVENOUS

## 2020-10-10 MED ORDER — SODIUM CHLORIDE 0.9% FLUSH: 3.0000 mL | INTRAVENOUS | Status: DC | PRN

## 2020-10-10 MED ORDER — SODIUM CHLORIDE 0.9 % WEIGHT BASED INFUSION: 1.0000 mL/kg/h | INTRAVENOUS | Status: DC

## 2020-10-10 MED ORDER — VERAPAMIL HCL 2.5 MG/ML IV SOLN
INTRAVENOUS | Status: DC | PRN
  Administered 2020-10-10: 10 mL via INTRA_ARTERIAL

## 2020-10-10 MED ORDER — SODIUM CHLORIDE 0.9% FLUSH: 3.0000 mL | Freq: Two times a day (BID) | INTRAVENOUS | Status: DC

## 2020-10-10 MED ORDER — SODIUM CHLORIDE 0.9 % IV SOLN: 250.0000 mL | INTRAVENOUS | Status: DC | PRN

## 2020-10-10 MED ORDER — FENTANYL CITRATE (PF) 100 MCG/2ML IJ SOLN
INTRAMUSCULAR | Status: AC
  Filled 2020-10-10: qty 2

## 2020-10-10 MED ORDER — LATANOPROST 0.005 % OP SOLN
1.0000 [drp] | Freq: Every day | OPHTHALMIC | Status: DC
  Administered 2020-10-10 – 2020-10-18 (×10): 1 [drp] via OPHTHALMIC
  Filled 2020-10-10: qty 2.5

## 2020-10-10 MED ORDER — LIDOCAINE HCL (PF) 1 % IJ SOLN
INTRAMUSCULAR | Status: DC | PRN
  Administered 2020-10-10: 2 mL

## 2020-10-10 MED ORDER — IOHEXOL 350 MG/ML SOLN
INTRAVENOUS | Status: DC | PRN
  Administered 2020-10-10: 80 mL

## 2020-10-10 MED ORDER — HEPARIN (PORCINE) IN NACL 1000-0.9 UT/500ML-% IV SOLN
INTRAVENOUS | Status: DC | PRN
  Administered 2020-10-10 (×2): 500 mL

## 2020-10-10 MED ORDER — HEPARIN (PORCINE) IN NACL 1000-0.9 UT/500ML-% IV SOLN
INTRAVENOUS | Status: AC
  Filled 2020-10-10: qty 1000

## 2020-10-10 MED ORDER — HYDROCHLOROTHIAZIDE 25 MG PO TABS
25.0000 mg | ORAL_TABLET | Freq: Every day | ORAL | Status: DC
  Administered 2020-10-10 – 2020-10-13 (×4): 25 mg via ORAL
  Filled 2020-10-10 (×4): qty 1

## 2020-10-10 MED ORDER — VALACYCLOVIR HCL 500 MG PO TABS
2000.0000 mg | ORAL_TABLET | Freq: Two times a day (BID) | ORAL | Status: DC
  Administered 2020-10-10 – 2020-10-14 (×4): 2000 mg via ORAL
  Filled 2020-10-10 (×12): qty 4

## 2020-10-10 MED ORDER — ISOSORBIDE MONONITRATE ER 30 MG PO TB24
30.0000 mg | ORAL_TABLET | Freq: Every day | ORAL | Status: DC
  Administered 2020-10-10 – 2020-10-13 (×4): 30 mg via ORAL
  Filled 2020-10-10 (×4): qty 1

## 2020-10-10 MED ORDER — IRBESARTAN 150 MG PO TABS
150.0000 mg | ORAL_TABLET | Freq: Every day | ORAL | Status: DC
  Administered 2020-10-10 – 2020-10-13 (×4): 150 mg via ORAL
  Filled 2020-10-10 (×4): qty 1

## 2020-10-10 MED ORDER — HEPARIN SODIUM (PORCINE) 1000 UNIT/ML IJ SOLN
INTRAMUSCULAR | Status: AC
  Filled 2020-10-10: qty 1

## 2020-10-10 SURGICAL SUPPLY — 15 items
BAG SNAP BAND KOVER 36X36 (MISCELLANEOUS) ×1 IMPLANT
CATH 5FR JL3.5 JR4 ANG PIG MP (CATHETERS) ×1 IMPLANT
CATH VISTA GUIDE 6FR XBLAD3.5 (CATHETERS) ×1 IMPLANT
COVER DOME SNAP 22 D (MISCELLANEOUS) ×1 IMPLANT
DEVICE RAD COMP TR BAND LRG (VASCULAR PRODUCTS) ×1 IMPLANT
GLIDESHEATH SLEND SS 6F .021 (SHEATH) ×1 IMPLANT
GUIDEWIRE INQWIRE 1.5J.035X260 (WIRE) IMPLANT
GUIDEWIRE PRESSURE X 175 (WIRE) ×1 IMPLANT
INQWIRE 1.5J .035X260CM (WIRE) ×2
KIT ESSENTIALS PG (KITS) ×1 IMPLANT
KIT HEART LEFT (KITS) ×2 IMPLANT
PACK CARDIAC CATHETERIZATION (CUSTOM PROCEDURE TRAY) ×2 IMPLANT
SHEATH PROBE COVER 6X72 (BAG) ×1 IMPLANT
TRANSDUCER W/STOPCOCK (MISCELLANEOUS) ×2 IMPLANT
TUBING CIL FLEX 10 FLL-RA (TUBING) ×2 IMPLANT

## 2020-10-10 NOTE — Progress Notes (Signed)
  Echocardiogram 2D Echocardiogram has been performed with [facility].  Gerda Diss 10/10/2020, 11:49 AM

## 2020-10-10 NOTE — Telephone Encounter (Signed)
FYI, called patient and she states she is in [facility] now.  She went to [facility] with chest pains and was transferred to [facility].

## 2020-10-10 NOTE — Progress Notes (Signed)
[place] for heparin gtt  Indication: chest pain/ACS  No Known Allergies  Patient Measurements: Height: '5\' 1"'  (154.9 cm) Weight: 93 kg (205 lb 1.6 oz) IBW/kg (Calculated) : 47.8 Heparin Dosing Weight: HEPARIN DW (KG): 69.9  Vital Signs: Temp: 98.4 F (36.9 C) (02/25 1411) Temp Source: Oral (02/25 1411) BP: 156/79 (02/25 1411) Pulse Rate: 94 (02/25 1411)  Labs: Recent Labs    10/09/20 1848 10/10/20 0144 10/10/20 0445 10/10/20 1043 10/10/20 1210  HGB 14.0  --  13.9  --   --   HCT 43.3  --  42.6  --   --   PLT 220  --  215  --   --   LABPROT  --  13.0  --   --   --   INR  --  1.0  --   --   --   HEPARINUNFRC  --  0.28* 0.34  --   --   CREATININE 0.87 0.91  --  0.88 0.94    Estimated Creatinine Clearance: 61.2 mL/min (by C-G formula based on SCr of 0.94 mg/dL).   Medical History: Past Medical History:  Diagnosis Date  . Anxiety   . Hypertension   . Sleep apnea     Medications:  Medications Prior to Admission  Medication Sig Dispense Refill Last Dose  . calcium gluconate 500 MG tablet Take 500 mg by mouth daily.   10/08/2020 at Unknown time  . escitalopram (LEXAPRO) 10 MG tablet Take 1 tablet (10 mg total) by mouth daily. 90 tablet 2 10/09/2020 at Unknown time  . latanoprost (XALATAN) 0.005 % ophthalmic solution Place 1 drop into both eyes at bedtime.   10/08/2020 at Unknown time  . metFORMIN (GLUCOPHAGE) 500 MG tablet Take 1 tablet (500 mg total) by mouth daily with breakfast. 90 tablet 0 10/09/2020 at Unknown time  . Multiple Vitamin (MULTIVITAMIN) capsule Take 1 capsule by mouth daily.   10/09/2020 at Unknown time  . nitroGLYCERIN (NITROSTAT) 0.4 MG SL tablet Place 1 tablet (0.4 mg total) under the tongue every 5 (five) minutes as needed for chest pain. 50 tablet 3 10/09/2020 at Unknown time  . rosuvastatin (CRESTOR) 5 MG tablet Take 1 tablet (5 mg total) by mouth every other day. 45 tablet 3 10/09/2020 at Unknown time  .  valACYclovir (VALTREX) 1000 MG tablet TAKE 2 TABLETS BY MOUTH  TWICE DAILY 12 tablet 1   . valsartan-hydrochlorothiazide (DIOVAN-HCT) 160-25 MG tablet Take 1 tablet by mouth daily. 90 tablet 0 10/09/2020 at Unknown time  . baclofen (LIORESAL) 10 MG tablet Take 1 tablet (10 mg total) by mouth 3 (three) times daily. (Patient not taking: No sig reported) 90 tablet 3 Not Taking at Unknown time  . blood glucose meter kit and supplies Dispense based on patient and insurance preference. Use up to four times daily as directed. (FOR ICD-10 E10.9, E11.9). 1 each 0   . Multiple Vitamins-Minerals (MULTIVITAMIN WITH MINERALS) tablet Take 1 tablet by mouth daily. (Patient not taking: No sig reported)   Not Taking at Unknown time  . Omega-3 Fatty Acids (FISH OIL) 1000 MG CAPS Take by mouth. (Patient not taking: No sig reported)   Not Taking at Unknown time   Scheduled:  . aspirin  324 mg Oral NOW   Or  . aspirin  300 mg Rectal NOW  . aspirin EC  81 mg Oral Daily  . calcium carbonate  1 tablet Oral Q breakfast  . escitalopram  10 mg  Oral Daily  . irbesartan  150 mg Oral Daily   And  . hydrochlorothiazide  25 mg Oral Daily  . insulin aspart  0-15 Units Subcutaneous TID WC  . insulin aspart  0-5 Units Subcutaneous QHS  . isosorbide mononitrate  30 mg Oral Daily  . latanoprost  1 drop Both Eyes QHS  . multivitamin with minerals  1 tablet Oral Daily  . rosuvastatin  5 mg Oral QODAY  . [START ON 10/11/2020] sodium chloride flush  3 mL Intravenous Q12H  . valACYclovir  2,000 mg Oral BID   Infusions:  . sodium chloride    . sodium chloride     PRN:  Anti-infectives (From admission, onward)   Start     Dose/Rate Route Frequency Ordered Stop   10/10/20 0115  valACYclovir (VALTREX) tablet 2,000 mg        2,000 mg Oral 2 times daily 10/10/20 0027        Assessment: KEILLY FATULA a 65 y.o. female requires anticoagulation with a heparin iv infusion for the indication of  chest pain/ACS. Heparin gtt will  be started following pharmacy protocol per pharmacy consult. Patient is not on previous oral anticoagulant that will require aPTT/HL correlation before transitioning to only HL monitoring.   S/p cath today, found 3 vessel CAD. Now for CT surgery consultation eval for CABG. Pharmacy consulted to resume heparin 8h after sheath removal (removed '@1345' ). Heparin level this morning was therapeutic on 1000 units/hr. CBC wnl.   Goal of Therapy:  Heparin level 0.3-0.7 units/ml Monitor platelets by anticoagulation protocol: Yes   Plan:  Resume heparin at 1000 units/hr 2/25'@2200'  6h heparin level  Daily heparin level, CBC while on heparin Monitor s/s bleeding  Rebbeca Paul, PharmD PGY1 Pharmacy Resident 10/10/2020 2:17 PM  Please check [email or web address] for unit-specific pharmacy phone numbers.

## 2020-10-10 NOTE — H&P (View-Only) (Signed)
Progress Note  Patient Name: Danielle Ellis Date of Encounter: 10/10/2020  [facility] Cardiologist: Peter [place], MD   Subjective   Pt states she has been chest pain free since nitro administered by EMS yesterday. She reports intermittent CP for the last three weeks. She does not have exertional chest pain - CP can be associated with SOB, diaphoresis, and nausea, but is generally at rest after she has done house chores. She is somewhat sedentary since her ankle fracture - grocery shopping exhausts her.   Inpatient Medications    Scheduled Meds: . aspirin  324 mg Oral NOW   Or  . aspirin  300 mg Rectal NOW  . aspirin EC  81 mg Oral Daily  . calcium carbonate  1 tablet Oral Q breakfast  . escitalopram  10 mg Oral Daily  . irbesartan  150 mg Oral Daily   And  . hydrochlorothiazide  25 mg Oral Daily  . insulin aspart  0-15 Units Subcutaneous TID WC  . insulin aspart  0-5 Units Subcutaneous QHS  . latanoprost  1 drop Both Eyes QHS  . multivitamin with minerals  1 tablet Oral Daily  . rosuvastatin  5 mg Oral QODAY  . valACYclovir  2,000 mg Oral BID   Continuous Infusions: . heparin 1,000 Units/hr (10/09/20 1928)   PRN Meds: acetaminophen, nitroGLYCERIN, ondansetron (ZOFRAN) IV   Vital Signs    Vitals:   10/09/20 2230 10/10/20 0044 10/10/20 0500 10/10/20 0612  BP: 138/81 140/87  138/75  Pulse: 87 95  94  Resp: 20 20    Temp:  98.3 F (36.8 C)  97.9 F (36.6 C)  TempSrc:  Oral  Oral  SpO2: 97% 98%  97%  Weight:  93.4 kg 93 kg   Height:  5\' 1"  (1.549 m)     No intake or output data in the 24 hours ending 10/10/20 0648 Last 3 Weights 10/10/2020 10/10/2020 10/09/2020  Weight (lbs) 205 lb 1.6 oz 206 lb 210 lb 1.6 oz  Weight (kg) 93.033 kg 93.441 kg 95.3 kg      Telemetry    Sinus rhythm HR 80 - Personally Reviewed  ECG    Sinus rhythm HR 88, TWI inferior leads  - Personally Reviewed  Physical Exam   GEN: No acute distress.   Neck: No JVD Cardiac: RRR, no  murmurs, rubs, or gallops.  Respiratory: Clear to auscultation bilaterally. GI: Soft, nontender, non-distended  MS: No edema; No deformity. Neuro:  Nonfocal  Psych: Normal affect   Labs    High Sensitivity Troponin:   Recent Labs  Lab 10/09/20 1848 10/09/20 2103 10/09/20 2324 10/10/20 0144  TROPONINIHS 188* 156* 217* 195*      Chemistry Recent Labs  Lab 10/03/20 0833 10/09/20 1848  NA 139 137  K 3.8 3.2*  CL 99 101  CO2 21 22  GLUCOSE 125* 119*  BUN 18 18  CREATININE 0.88 0.87  CALCIUM 10.1 9.4  PROT 7.5  --   ALBUMIN 4.9*  --   AST 32  --   ALT 41*  --   ALKPHOS 132*  --   BILITOT 0.5  --   GFRNONAA 69 >60  GFRAA 79  --   ANIONGAP  --  14     Hematology Recent Labs  Lab 10/03/20 0833 10/09/20 1848 10/10/20 0445  WBC 5.9 7.8 8.4  RBC 5.11 4.88 4.89  HGB 14.5 14.0 13.9  HCT 44.6 43.3 42.6  MCV 87 88.7 87.1  MCH  28.4 28.7 28.4  MCHC 32.5 32.3 32.6  RDW 13.5 13.3 13.2  PLT 199 220 215    BNPNo results for input(s): BNP, PROBNP in the last 168 hours.   DDimer No results for input(s): DDIMER in the last 168 hours.   Radiology    DG Chest Portable 1 View  Result Date: 10/09/2020 CLINICAL DATA:  Chest pain EXAM: PORTABLE CHEST 1 VIEW COMPARISON:  None. FINDINGS: The heart size and mediastinal contours are within normal limits. Mild aortic atherosclerosis. Both lungs are clear. The visualized skeletal structures are unremarkable. IMPRESSION: No active disease. Electronically Signed   By: Jasmine Pang M.D.   On: 10/09/2020 19:27    Cardiac Studies   Echo pending  Patient Profile     65 y.o. female with a history of HTN, HLD, pre-diabetes, but no hx of CAD, presented to ED with chest pain found to have an elevated troponin.   Assessment & Plan    NSTEMI CAD - hs troponin 156 --> 217 --> 195 - EKG with TWI inferior leads, long QT - chest pain concerning for unstable angina - will proceed with heart cath today   Hypertension - continue  ARB-HCTZ - pressure controlled   Hyperlipidemia 10/10/2020: Cholesterol 272; HDL 33; LDL Cholesterol 182; Triglycerides 285; VLDL 57 - on 5 mg crestor every other day   Pre-diabetes - A1c pending - SSI - home metformin on hold    For questions or updates, please contact [facility] Please consult [email or web address] for contact info under        Signed, Marcelino Duster, PA  10/10/2020, 6:48 AM

## 2020-10-10 NOTE — Progress Notes (Signed)
Heparin drip was paused for about 1 hr due to loss of IV access.  Hinton Dyer, RN

## 2020-10-10 NOTE — Progress Notes (Signed)
ANTICOAGULATION CONSULT NOTE - Initial Consult  Pharmacy Consult for heparin gtt  Indication: chest pain/ACS  No Known Allergies  Patient Measurements: Height: '5\' 1"'  (154.9 cm) Weight: 93.4 kg (206 lb) IBW/kg (Calculated) : 47.8 Heparin Dosing Weight: HEPARIN DW (KG): 69.9   Vital Signs: Temp: 98.3 F (36.8 C) (02/25 0044) Temp Source: Oral (02/25 0044) BP: 140/87 (02/25 0044) Pulse Rate: 95 (02/25 0044)  Labs: Recent Labs    10/09/20 1848 10/10/20 0445  HGB 14.0 13.9  HCT 43.3 42.6  PLT 220 215  HEPARINUNFRC  --  0.34  CREATININE 0.87  --     Estimated Creatinine Clearance: 66.3 mL/min (by C-G formula based on SCr of 0.87 mg/dL).   Medical History: Past Medical History:  Diagnosis Date  . Anxiety   . Hypertension   . Sleep apnea     Medications:  Medications Prior to Admission  Medication Sig Dispense Refill Last Dose  . calcium gluconate 500 MG tablet Take 500 mg by mouth daily.   10/08/2020 at Unknown time  . escitalopram (LEXAPRO) 10 MG tablet Take 1 tablet (10 mg total) by mouth daily. 90 tablet 2 10/09/2020 at Unknown time  . latanoprost (XALATAN) 0.005 % ophthalmic solution Place 1 drop into both eyes at bedtime.   10/08/2020 at Unknown time  . metFORMIN (GLUCOPHAGE) 500 MG tablet Take 1 tablet (500 mg total) by mouth daily with breakfast. 90 tablet 0 10/09/2020 at Unknown time  . Multiple Vitamin (MULTIVITAMIN) capsule Take 1 capsule by mouth daily.   10/09/2020 at Unknown time  . nitroGLYCERIN (NITROSTAT) 0.4 MG SL tablet Place 1 tablet (0.4 mg total) under the tongue every 5 (five) minutes as needed for chest pain. 50 tablet 3 10/09/2020 at Unknown time  . rosuvastatin (CRESTOR) 5 MG tablet Take 1 tablet (5 mg total) by mouth every other day. 45 tablet 3 10/09/2020 at Unknown time  . valACYclovir (VALTREX) 1000 MG tablet TAKE 2 TABLETS BY MOUTH  TWICE DAILY 12 tablet 1   . valsartan-hydrochlorothiazide (DIOVAN-HCT) 160-25 MG tablet Take 1 tablet by mouth  daily. 90 tablet 0 10/09/2020 at Unknown time  . baclofen (LIORESAL) 10 MG tablet Take 1 tablet (10 mg total) by mouth 3 (three) times daily. (Patient not taking: No sig reported) 90 tablet 3 Not Taking at Unknown time  . blood glucose meter kit and supplies Dispense based on patient and insurance preference. Use up to four times daily as directed. (FOR ICD-10 E10.9, E11.9). 1 each 0   . Multiple Vitamins-Minerals (MULTIVITAMIN WITH MINERALS) tablet Take 1 tablet by mouth daily. (Patient not taking: No sig reported)   Not Taking at Unknown time  . Omega-3 Fatty Acids (FISH OIL) 1000 MG CAPS Take by mouth. (Patient not taking: No sig reported)   Not Taking at Unknown time   Scheduled:  . aspirin  324 mg Oral NOW   Or  . aspirin  300 mg Rectal NOW  . aspirin EC  81 mg Oral Daily  . calcium carbonate  1 tablet Oral Q breakfast  . escitalopram  10 mg Oral Daily  . irbesartan  150 mg Oral Daily   And  . hydrochlorothiazide  25 mg Oral Daily  . insulin aspart  0-15 Units Subcutaneous TID WC  . insulin aspart  0-5 Units Subcutaneous QHS  . latanoprost  1 drop Both Eyes QHS  . multivitamin with minerals  1 tablet Oral Daily  . rosuvastatin  5 mg Oral QODAY  . valACYclovir  2,000 mg Oral BID   Infusions:  . heparin 1,000 Units/hr (10/09/20 1928)   PRN:  Anti-infectives (From admission, onward)   Start     Dose/Rate Route Frequency Ordered Stop   10/10/20 0115  valACYclovir (VALTREX) tablet 2,000 mg        2,000 mg Oral 2 times daily 10/10/20 0027        Assessment: Danielle Ellis a 65 y.o. female requires anticoagulation with a heparin iv infusion for the indication of  chest pain/ACS. Heparin gtt will be started following pharmacy protocol per pharmacy consult. Patient is not on previous oral anticoagulant that will require aPTT/HL correlation before transitioning to only HL monitoring.   2/25 AM update:  Heparin level therapeutic   Goal of Therapy:  Heparin level 0.3-0.7  units/ml Monitor platelets by anticoagulation protocol: Yes   Plan:  Cont heparin at 1000 units/hr 1200 heparin level Likely cath today  Narda Bonds, PharmD, [place] Pharmacist Phone: [phone]

## 2020-10-10 NOTE — Interval H&P Note (Signed)
History and Physical Interval Note:  10/10/2020 1:03 PM  Danielle Ellis  has presented today for surgery, with the diagnosis of chest pain.  The various methods of treatment have been discussed with the patient and family. After consideration of risks, benefits and other options for treatment, the patient has consented to  Procedure(s): LEFT HEART CATH AND CORONARY ANGIOGRAPHY (N/A) as a surgical intervention.  The patient's history has been reviewed, patient examined, no change in status, stable for surgery.  I have reviewed the patient's chart and labs.  Questions were answered to the patient's satisfaction.   Cath Lab Visit (complete for each Cath Lab visit)  Clinical Evaluation Leading to the Procedure:   ACS: Yes.    Non-ACS:    Anginal Classification: CCS IV  Anti-ischemic medical therapy: Minimal Therapy (1 class of medications)  Non-Invasive Test Results: No non-invasive testing performed  Prior CABG: No previous CABG        Theron Arista [facility] 10/10/2020 1:03 PM

## 2020-10-10 NOTE — Consult Note (Signed)
[address]       Danielle,[place] Ellis             [phone]      Cardiothoracic Surgery Consultation  Reason for Consult: Severe multivessel coronary disease Referring Physician: Dr. Eleonore Chiquito  Danielle Ellis is an 65 y.o. female.  HPI:   The patient is a 65 year old woman with a history of diabetes, hyperlipidemia, hypertension, and a family history of premature coronary disease who presents with a several week history of recurrent episodes of substernal chest pain radiating into the left shoulder with numbness in her left hand.  She has had some associated shortness of breath and diaphoresis as well as nausea.  These episodes have been occurring after minimal activity at home as well as after eating.  On presentation she had mildly elevated troponin and electrocardiogram that showed diffuse ST depressions with new T wave inversions inferiorly.  2D echocardiogram today showed normal left ventricular systolic function with no significant valvular abnormality.  Cardiac catheterization showed severe three-vessel coronary disease.  Her daughter is here with her today.  She lives at home with her husband.    Past Medical History:  Diagnosis Date  . Anxiety   . Hypertension   . Sleep apnea     Past Surgical History:  Procedure Laterality Date  . APPENDECTOMY    . L ankle surgery      Family History  Problem Relation Age of Onset  . Diabetes Mother   . Kidney disease Mother   . Cancer Mother   . Heart attack Mother     Social History:  reports that she has never smoked. She has never used smokeless tobacco. She reports that she does not drink alcohol and does not use drugs.  Allergies: No Known Allergies  Medications:  I have reviewed the patient's current medications. Prior to Admission:  Medications Prior to Admission  Medication Sig Dispense Refill Last Dose  . calcium gluconate 500 MG tablet Take 500 mg by mouth daily.   10/08/2020 at  Unknown time  . escitalopram (LEXAPRO) 10 MG tablet Take 1 tablet (10 mg total) by mouth daily. 90 tablet 2 10/09/2020 at Unknown time  . latanoprost (XALATAN) 0.005 % ophthalmic solution Place 1 drop into both eyes at bedtime.   10/08/2020 at Unknown time  . metFORMIN (GLUCOPHAGE) 500 MG tablet Take 1 tablet (500 mg total) by mouth daily with breakfast. 90 tablet 0 10/09/2020 at Unknown time  . Multiple Vitamin (MULTIVITAMIN) capsule Take 1 capsule by mouth daily.   10/09/2020 at Unknown time  . nitroGLYCERIN (NITROSTAT) 0.4 MG SL tablet Place 1 tablet (0.4 mg total) under the tongue every 5 (five) minutes as needed for chest pain. 50 tablet 3 10/09/2020 at Unknown time  . rosuvastatin (CRESTOR) 5 MG tablet Take 1 tablet (5 mg total) by mouth every other day. 45 tablet 3 10/09/2020 at Unknown time  . valACYclovir (VALTREX) 1000 MG tablet TAKE 2 TABLETS BY MOUTH  TWICE DAILY 12 tablet 1   . valsartan-hydrochlorothiazide (DIOVAN-HCT) 160-25 MG tablet Take 1 tablet by mouth daily. 90 tablet 0 10/09/2020 at Unknown time  . baclofen (LIORESAL) 10 MG tablet Take 1 tablet (10 mg total) by mouth 3 (three) times daily. (Patient not taking: No sig reported) 90 tablet 3 Not Taking at Unknown time  . blood glucose meter kit and supplies Dispense based on patient and insurance preference. Use up to four times daily as directed. (  FOR ICD-10 E10.9, E11.9). 1 each 0   . Multiple Vitamins-Minerals (MULTIVITAMIN WITH MINERALS) tablet Take 1 tablet by mouth daily. (Patient not taking: No sig reported)   Not Taking at Unknown time  . Omega-3 Fatty Acids (FISH OIL) 1000 MG CAPS Take by mouth. (Patient not taking: No sig reported)   Not Taking at Unknown time   Scheduled: . aspirin  324 mg Oral NOW   Or  . aspirin  300 mg Rectal NOW  . aspirin EC  81 mg Oral Daily  . calcium carbonate  1 tablet Oral Q breakfast  . escitalopram  10 mg Oral Daily  . irbesartan  150 mg Oral Daily   And  . hydrochlorothiazide  25 mg Oral  Daily  . insulin aspart  0-15 Units Subcutaneous TID WC  . insulin aspart  0-5 Units Subcutaneous QHS  . isosorbide mononitrate  30 mg Oral Daily  . latanoprost  1 drop Both Eyes QHS  . multivitamin with minerals  1 tablet Oral Daily  . rosuvastatin  5 mg Oral QODAY  . [START ON 10/11/2020] sodium chloride flush  3 mL Intravenous Q12H  . valACYclovir  2,000 mg Oral BID   Continuous: . sodium chloride    . sodium chloride    . heparin     SPQ:ZRAQTM chloride, acetaminophen, nitroGLYCERIN, ondansetron (ZOFRAN) IV, sodium chloride flush Anti-infectives (From admission, onward)   Start     Dose/Rate Route Frequency Ordered Stop   10/10/20 0115  valACYclovir (VALTREX) tablet 2,000 mg        2,000 mg Oral 2 times daily 10/10/20 0027        Results for orders placed or performed during the hospital encounter of 10/09/20 (from the past 48 hour(s))  Basic metabolic panel     Status: Abnormal   Collection Time: 10/09/20  6:48 PM  Result Value Ref Range   Sodium 137 135 - 145 mmol/L   Potassium 3.2 (L) 3.5 - 5.1 mmol/L   Chloride 101 98 - 111 mmol/L   CO2 22 22 - 32 mmol/L   Glucose, Bld 119 (H) 70 - 99 mg/dL    Comment: Glucose reference range applies only to samples taken after fasting for at least 8 hours.   BUN 18 8 - 23 mg/dL   Creatinine, Ser 0.87 0.44 - 1.00 mg/dL   Calcium 9.4 8.9 - 10.3 mg/dL   GFR, Estimated >60 >60 mL/min    Comment: (NOTE) Calculated using the CKD-EPI Creatinine Equation (2021)    Anion gap 14 5 - 15    Comment: Performed at [facility], [address], [place], [place] [address]  Troponin I (High Sensitivity)     Status: Abnormal   Collection Time: 10/09/20  6:48 PM  Result Value Ref Range   Troponin I (High Sensitivity) 188 (HH) <18 ng/L    Comment: CRITICAL RESULT CALLED TO, READ BACK BY AND VERIFIED WITH: BELTON,K ON 10/09/20 AT 2020 BY LOY,C (NOTE) Elevated high sensitivity troponin I (hsTnI) values and significant  changes across serial  measurements may suggest ACS but many other  chronic and acute conditions are known to elevate hsTnI results.  Refer to the Links section for chest pain algorithms and additional  guidance. Performed at [facility], [address], [place], [place] [address]   CBC     Status: None   Collection Time: 10/09/20  6:48 PM  Result Value Ref Range   WBC 7.8 4.0 - 10.5 K/uL   RBC  4.88 3.87 - 5.11 MIL/uL   Hemoglobin 14.0 12.0 - 15.0 g/dL   HCT 43.3 36.0 - 46.0 %   MCV 88.7 80.0 - 100.0 fL   MCH 28.7 26.0 - 34.0 pg   MCHC 32.3 30.0 - 36.0 g/dL   RDW 13.3 11.5 - 15.5 %   Platelets 220 150 - 400 K/uL   nRBC 0.0 0.0 - 0.2 %    Comment: Performed at [facility], [address], [place], [place] [address]  TSH     Status: Abnormal   Collection Time: 10/09/20  6:48 PM  Result Value Ref Range   TSH 4.842 (H) 0.350 - 4.500 uIU/mL    Comment: Performed by a 3rd Generation assay with a functional sensitivity of <=0.01 uIU/mL. Performed at [facility], [address], [place], [place] [address]   Hemoglobin A1c     Status: Abnormal   Collection Time: 10/09/20  6:48 PM  Result Value Ref Range   Hgb A1c MFr Bld 6.2 (H) 4.8 - 5.6 %    Comment: (NOTE) Pre diabetes:          5.7%-6.4%  Diabetes:              >6.4%  Glycemic control for   <7.0% adults with diabetes    Mean Plasma Glucose 131.24 mg/dL    Comment: Performed at [facility] [address], [place], [place] [address]  Resp Panel by RT-PCR (Flu A&B, Covid) Nasopharyngeal Swab     Status: None   Collection Time: 10/09/20  6:56 PM   Specimen: Nasopharyngeal Swab; Nasopharyngeal(NP) swabs in vial transport medium  Result Value Ref Range   SARS Coronavirus 2 by RT PCR NEGATIVE NEGATIVE    Comment: (NOTE) SARS-CoV-2 target nucleic acids are NOT DETECTED.  The SARS-CoV-2 RNA is generally detectable in upper respiratory specimens during the acute phase of infection. The lowest concentration of SARS-CoV-2 viral copies this assay  can detect is 138 copies/mL. A negative result does not preclude SARS-Cov-2 infection and should not be used as the sole basis for treatment or other patient management decisions. A negative result may occur with  improper specimen collection/handling, submission of specimen other than nasopharyngeal swab, presence of viral mutation(s) within the areas targeted by this assay, and inadequate number of viral copies(<138 copies/mL). A negative result must be combined with clinical observations, patient history, and epidemiological information. The expected result is Negative.  Fact Sheet for Patients:  EntrepreneurPulse.com.au  Fact Sheet for Healthcare Providers:  IncredibleEmployment.be  This test is no t yet approved or cleared by the [place] FDA and  has been authorized for detection and/or diagnosis of SARS-CoV-2 by FDA under an Emergency Use Authorization (EUA). This EUA will remain  in effect (meaning this test can be used) for the duration of the COVID-19 declaration under Section 564(b)(1) of the Act, 21 U.S.C.section 360bbb-3(b)(1), unless the authorization is terminated  or revoked sooner.       Influenza A by PCR NEGATIVE NEGATIVE   Influenza B by PCR NEGATIVE NEGATIVE    Comment: (NOTE) The Xpert Xpress SARS-CoV-2/FLU/RSV plus assay is intended as an aid in the diagnosis of influenza from Nasopharyngeal swab specimens and should not be used as a sole basis for treatment. Nasal washings and aspirates are unacceptable for Xpert Xpress SARS-CoV-2/FLU/RSV testing.  Fact Sheet for Patients: EntrepreneurPulse.com.au  Fact Sheet for Healthcare Providers: IncredibleEmployment.be  This test is not yet approved or cleared by the [place] FDA and has  been authorized for detection and/or diagnosis of SARS-CoV-2 by FDA under an Emergency Use Authorization (EUA). This EUA will remain in effect  (meaning this test can be used) for the duration of the COVID-19 declaration under Section 564(b)(1) of the Act, 21 U.S.C. section 360bbb-3(b)(1), unless the authorization is terminated or revoked.  Performed at [facility], [address] [place], [place] [address]   Troponin I (High Sensitivity)     Status: Abnormal   Collection Time: 10/09/20  9:03 PM  Result Value Ref Range   Troponin I (High Sensitivity) 156 (HH) <18 ng/L    Comment: DELTA CHECK NOTED CRITICAL RESULT CALLED TO, READ BACK BY AND VERIFIED WITH: NICKOLS,K. RN '@1017'  10/09/20 BILLINGSLEY,L (NOTE) Elevated high sensitivity troponin I (hsTnI) values and significant  changes across serial measurements may suggest ACS but many other  chronic and acute conditions are known to elevate hsTnI results.  Refer to the Links section for chest pain algorithms and additional  guidance. Performed at [facility], [address] [place], [place] [address]   CBG monitoring, ED     Status: Abnormal   Collection Time: 10/09/20 10:31 PM  Result Value Ref Range   Glucose-Capillary 133 (H) 70 - 99 mg/dL    Comment: Glucose reference range applies only to samples taken after fasting for at least 8 hours.  HIV Antibody (routine testing w rflx)     Status: None   Collection Time: 10/09/20 11:24 PM  Result Value Ref Range   HIV Screen 4th Generation wRfx Non Reactive Non Reactive    Comment: Performed at [facility], [place] [address] [place], [place] [address]  Troponin I (High Sensitivity)     Status: Abnormal   Collection Time: 10/09/20 11:24 PM  Result Value Ref Range   Troponin I (High Sensitivity) 217 (HH) <18 ng/L    Comment: CRITICAL RESULT CALLED TO, READ BACK BY AND VERIFIED WITH: T WALKER,RN'@0019'  10/10/20 MKELLY (NOTE) Elevated high sensitivity troponin I (hsTnI) values and significant  changes across serial measurements may suggest ACS but many other  chronic and acute conditions are known to elevate hsTnI results.   Refer to the Links section for chest pain algorithms and additional  guidance. Performed at [facility], [address] [place], [place] [address]   Glucose, capillary     Status: Abnormal   Collection Time: 10/10/20 12:42 AM  Result Value Ref Range   Glucose-Capillary 140 (H) 70 - 99 mg/dL    Comment: Glucose reference range applies only to samples taken after fasting for at least 8 hours.  Troponin I (High Sensitivity)     Status: Abnormal   Collection Time: 10/10/20  1:44 AM  Result Value Ref Range   Troponin I (High Sensitivity) 195 (HH) <18 ng/L    Comment: CRITICAL RESULT CALLED TO, READ BACK BY AND VERIFIED WITH: ALLEN M,RN 10/10/20 0612 WAYK Performed at [facility], [address], [place], [place] [address]   Lipid panel     Status: Abnormal   Collection Time: 10/10/20  1:44 AM  Result Value Ref Range   Cholesterol 272 (H) 0 - 200 mg/dL   Triglycerides 285 (H) <150 mg/dL   HDL 33 (L) >40 mg/dL   Total CHOL/HDL Ratio 8.2 RATIO   VLDL 57 (H) 0 - 40 mg/dL   LDL Cholesterol 182 (H) 0 - 99 mg/dL    Comment:        Total Cholesterol/HDL:CHD Risk Coronary Heart Disease Risk Table  Men   Women  1/2 Average Risk   3.4   3.3  Average Risk       5.0   4.4  2 X Average Risk   9.6   7.1  3 X Average Risk  23.4   11.0        Use the calculated Patient Ratio above and the CHD Risk Table to determine the patient's CHD Risk.        ATP III CLASSIFICATION (LDL):  <100     mg/dL   Optimal  100-129  mg/dL   Near or Above                    Optimal  130-159  mg/dL   Borderline  160-189  mg/dL   High  >190     mg/dL   Very High Performed at [facility] [address] [place], [place] [address]   Heparin level (unfractionated)     Status: Abnormal   Collection Time: 10/10/20  1:44 AM  Result Value Ref Range   Heparin Unfractionated 0.28 (L) 0.30 - 0.70 IU/mL    Comment: (NOTE) If heparin results are below expected values, and patient  dosage has  been confirmed, suggest follow up testing of antithrombin III levels. Performed at [facility], [place] [address] [place], [place] [address]   Basic metabolic panel     Status: Abnormal   Collection Time: 10/10/20  1:44 AM  Result Value Ref Range   Sodium 141 135 - 145 mmol/L   Potassium 2.8 (L) 3.5 - 5.1 mmol/L   Chloride 101 98 - 111 mmol/L   CO2 22 22 - 32 mmol/L   Glucose, Bld 149 (H) 70 - 99 mg/dL    Comment: Glucose reference range applies only to samples taken after fasting for at least 8 hours.   BUN 16 8 - 23 mg/dL   Creatinine, Ser 0.91 0.44 - 1.00 mg/dL   Calcium 9.7 8.9 - 10.3 mg/dL   GFR, Estimated >60 >60 mL/min    Comment: (NOTE) Calculated using the CKD-EPI Creatinine Equation (2021)    Anion gap 18 (H) 5 - 15    Comment: Performed at [facility] [address], [place], [place] [address]  Protime-INR     Status: None   Collection Time: 10/10/20  1:44 AM  Result Value Ref Range   Prothrombin Time 13.0 11.4 - 15.2 seconds   INR 1.0 0.8 - 1.2    Comment: (NOTE) INR goal varies based on device and disease states. Performed at [facility], [place] [address] [place], [place] [address]   Heparin level (unfractionated)     Status: None   Collection Time: 10/10/20  4:45 AM  Result Value Ref Range   Heparin Unfractionated 0.34 0.30 - 0.70 IU/mL    Comment: (NOTE) If heparin results are below expected values, and patient dosage has  been confirmed, suggest follow up testing of antithrombin III levels. Performed at [facility], [place] [address] [place] [address]   CBC     Status: None   Collection Time: 10/10/20  4:45 AM  Result Value Ref Range   WBC 8.4 4.0 - 10.5 K/uL   RBC 4.89 3.87 - 5.11 MIL/uL   Hemoglobin 13.9 12.0 - 15.0 g/dL   HCT 42.6 36.0 - 46.0 %   MCV 87.1 80.0 - 100.0 fL   MCH 28.4 26.0 - 34.0 pg  MCHC 32.6 30.0 - 36.0 g/dL   RDW 13.2 11.5 - 15.5 %   Platelets 215 150 - 400 K/uL   nRBC 0.0 0.0 -  0.2 %    Comment: Performed at [facility], [place] [address] [place], [place] [address]  Glucose, capillary     Status: Abnormal   Collection Time: 10/10/20  8:22 AM  Result Value Ref Range   Glucose-Capillary 121 (H) 70 - 99 mg/dL    Comment: Glucose reference range applies only to samples taken after fasting for at least 8 hours.  Basic metabolic panel     Status: Abnormal   Collection Time: 10/10/20 10:43 AM  Result Value Ref Range   Sodium 140 135 - 145 mmol/L   Potassium 3.1 (L) 3.5 - 5.1 mmol/L   Chloride 102 98 - 111 mmol/L   CO2 22 22 - 32 mmol/L   Glucose, Bld 107 (H) 70 - 99 mg/dL    Comment: Glucose reference range applies only to samples taken after fasting for at least 8 hours.   BUN 17 8 - 23 mg/dL   Creatinine, Ser 0.88 0.44 - 1.00 mg/dL   Calcium 9.6 8.9 - 10.3 mg/dL   GFR, Estimated >60 >60 mL/min    Comment: (NOTE) Calculated using the CKD-EPI Creatinine Equation (2021)    Anion gap 16 (H) 5 - 15    Comment: Performed at [facility] [address] [place], [place] [address]  Basic metabolic panel     Status: Abnormal   Collection Time: 10/10/20 12:10 PM  Result Value Ref Range   Sodium 140 135 - 145 mmol/L   Potassium 3.6 3.5 - 5.1 mmol/L   Chloride 103 98 - 111 mmol/L   CO2 20 (L) 22 - 32 mmol/L   Glucose, Bld 107 (H) 70 - 99 mg/dL    Comment: Glucose reference range applies only to samples taken after fasting for at least 8 hours.   BUN 16 8 - 23 mg/dL   Creatinine, Ser 0.94 0.44 - 1.00 mg/dL   Calcium 9.7 8.9 - 10.3 mg/dL   GFR, Estimated >60 >60 mL/min    Comment: (NOTE) Calculated using the CKD-EPI Creatinine Equation (2021)    Anion gap 17 (H) 5 - 15    Comment: Performed at [facility] [address], [place], [place] [address]  Glucose, capillary     Status: Abnormal   Collection Time: 10/10/20 12:27 PM  Result Value Ref Range   Glucose-Capillary 116 (H) 70 - 99 mg/dL    Comment: Glucose reference range applies only to  samples taken after fasting for at least 8 hours.  Glucose, capillary     Status: Abnormal   Collection Time: 10/10/20  4:49 PM  Result Value Ref Range   Glucose-Capillary 108 (H) 70 - 99 mg/dL    Comment: Glucose reference range applies only to samples taken after fasting for at least 8 hours.    CARDIAC CATHETERIZATION  Result Date: 10/10/2020  Prox LAD to Mid LAD lesion is 60% stenosed.  Mid LAD lesion is 60% stenosed.  Prox Cx to Mid Cx lesion is 85% stenosed.  1st Diag lesion is 90% stenosed.  Prox RCA lesion is 90% stenosed.  Mid RCA to Dist RCA lesion is 99% stenosed.  The left ventricular systolic function is normal.  LV end diastolic pressure is normal.  The left ventricular ejection fraction is 55-65% by visual estimate.  1. 3 vessel obstructive CAD   -  long 60% areas of stenosis in the proximal and mid LAD with abnormal RFR of 0.85   - 90% ostial first diagonal   -85% proximal to mid LCx   - 90% proximal RCA. Long 99% mid RCA 2. Normal LV function 3. Normal LVEDP Plan: recommend CT surgery consultation for CABG   DG Chest Portable 1 View  Result Date: 10/09/2020 CLINICAL DATA:  Chest pain EXAM: PORTABLE CHEST 1 VIEW COMPARISON:  None. FINDINGS: The heart size and mediastinal contours are within normal limits. Mild aortic atherosclerosis. Both lungs are clear. The visualized skeletal structures are unremarkable. IMPRESSION: No active disease. Electronically Signed   By: Donavan Foil M.D.   On: 10/09/2020 19:27   ECHOCARDIOGRAM COMPLETE  Result Date: 10/10/2020    ECHOCARDIOGRAM REPORT   Patient Name:   Danielle Ellis Date of Exam: 10/10/2020 Medical Rec #:  [id number]        Height:       61.0 in Accession #:    [id number]       Weight:       205.1 lb Date of Birth:  14-Jun-1956        BSA:          1.909 m Patient Age:    87 years         BP:           131/67 mmHg Patient Gender: F                HR:           94 bpm. Exam Location:  Inpatient Procedure: 2D Echo, Cardiac Doppler  and Color Doppler Indications:    Acute myocardial infarction  History:        Patient has no prior history of Echocardiogram examinations.                 CAD; Risk Factors:Diabetes, Hypertension and Dyslipidemia.  Sonographer:    Clayton Lefort RDCS (AE) Referring Phys: [id number] [facility]  Sonographer Comments: Suboptimal subcostal window. Image acquisition challenging due to patient body habitus. IMPRESSIONS  1. Left ventricular ejection fraction, by estimation, is 70 to 75%. The left ventricle has hyperdynamic function. The left ventricle has no regional wall motion abnormalities. Left ventricular diastolic parameters are consistent with Grade I diastolic dysfunction (impaired relaxation).  2. Right ventricular systolic function is normal. The right ventricular size is normal.  3. The mitral valve is normal in structure. No evidence of mitral valve regurgitation. No evidence of mitral stenosis.  4. The aortic valve is normal in structure. Aortic valve regurgitation is not visualized. Mild to moderate aortic valve sclerosis/calcification is present, without any evidence of aortic stenosis.  5. The inferior vena cava is normal in size with greater than 50% respiratory variability, suggesting right atrial pressure of 3 mmHg. FINDINGS  Left Ventricle: Left ventricular ejection fraction, by estimation, is 70 to 75%. The left ventricle has hyperdynamic function. The left ventricle has no regional wall motion abnormalities. Definity contrast agent was given IV to delineate the left ventricular endocardial borders. The left ventricular internal cavity size was normal in size. There is no left ventricular hypertrophy. Left ventricular diastolic parameters are consistent with Grade I diastolic dysfunction (impaired relaxation). Right Ventricle: The right ventricular size is normal. No increase in right ventricular wall thickness. Right ventricular systolic function is normal. Left Atrium: Left atrial size was  normal in size. Right Atrium: Right atrial size was normal in size. Pericardium: There is  no evidence of pericardial effusion. Mitral Valve: The mitral valve is normal in structure. Mild mitral annular calcification. No evidence of mitral valve regurgitation. No evidence of mitral valve stenosis. MV peak gradient, 5.6 mmHg. The mean mitral valve gradient is 1.0 mmHg. Tricuspid Valve: The tricuspid valve is normal in structure. Tricuspid valve regurgitation is not demonstrated. No evidence of tricuspid stenosis. Aortic Valve: The aortic valve is normal in structure. Aortic valve regurgitation is not visualized. Mild to moderate aortic valve sclerosis/calcification is present, without any evidence of aortic stenosis. Aortic valve mean gradient measures 5.0 mmHg. Aortic valve peak gradient measures 8.1 mmHg. Aortic valve area, by VTI measures 2.19 cm. Pulmonic Valve: The pulmonic valve was normal in structure. Pulmonic valve regurgitation is not visualized. No evidence of pulmonic stenosis. Aorta: The aortic root is normal in size and structure. Venous: The inferior vena cava is normal in size with greater than 50% respiratory variability, suggesting right atrial pressure of 3 mmHg. IAS/Shunts: No atrial level shunt detected by color flow Doppler.  LEFT VENTRICLE PLAX 2D LVIDd:         4.00 cm  Diastology LVIDs:         2.10 cm  LV e' medial:    4.24 cm/s LV PW:         1.40 cm  LV E/e' medial:  12.1 LV IVS:        1.10 cm  LV e' lateral:   8.49 cm/s LVOT diam:     2.00 cm  LV E/e' lateral: 6.1 LV SV:         53 LV SV Index:   28 LVOT Area:     3.14 cm  RIGHT VENTRICLE RV Basal diam:  2.80 cm RV S prime:     21.30 cm/s TAPSE (M-mode): 2.1 cm LEFT ATRIUM           Index       RIGHT ATRIUM           Index LA diam:      4.00 cm 2.10 cm/m  RA Area:     11.20 cm LA Vol (A2C): 27.4 ml 14.35 ml/m RA Volume:   25.00 ml  13.10 ml/m LA Vol (A4C): 46.8 ml 24.51 ml/m  AORTIC VALVE AV Area (Vmax):    2.37 cm AV Area  (Vmean):   2.32 cm AV Area (VTI):     2.19 cm AV Vmax:           142.00 cm/s AV Vmean:          102.000 cm/s AV VTI:            0.242 m AV Peak Grad:      8.1 mmHg AV Mean Grad:      5.0 mmHg LVOT Vmax:         107.00 cm/s LVOT Vmean:        75.400 cm/s LVOT VTI:          0.169 m LVOT/AV VTI ratio: 0.70  AORTA Ao Root diam: 3.00 cm Ao Asc diam:  3.10 cm MITRAL VALVE MV Area (PHT): 3.89 cm     SHUNTS MV Area VTI:   2.54 cm     Systemic VTI:  0.17 m MV Peak grad:  5.6 mmHg     Systemic Diam: 2.00 cm MV Mean grad:  1.0 mmHg MV Vmax:       1.18 m/s MV Vmean:      54.7 cm/s MV Decel Time: 195  msec MV E velocity: 51.40 cm/s MV A velocity: 105.00 cm/s MV E/A ratio:  0.49 Candee Furbish MD Electronically signed by Candee Furbish MD Signature Date/Time: 10/10/2020/2:17:26 PM    Final     Review of Systems  Constitutional: Positive for activity change and fatigue.  HENT: Negative.   Eyes: Negative.   Respiratory: Positive for chest tightness and shortness of breath.   Cardiovascular: Positive for chest pain. Negative for leg swelling.  Gastrointestinal: Negative.   Endocrine: Negative.   Genitourinary: Negative.   Musculoskeletal: Negative.   Skin: Negative.   Allergic/Immunologic: Negative.   Neurological: Negative for dizziness and syncope.  Hematological: Negative.   Psychiatric/Behavioral: The patient is nervous/anxious.    Blood pressure 136/75, pulse 92, temperature 99.1 F (37.3 C), temperature source Oral, resp. rate 17, height '5\' 1"'  (1.549 m), weight 93 kg, SpO2 96 %. Physical Exam Constitutional:      Appearance: She is well-developed. She is obese.     Comments: BMI of 38.75.  HENT:     Head: Normocephalic and atraumatic.  Eyes:     Extraocular Movements: Extraocular movements intact.     Conjunctiva/sclera: Conjunctivae normal.     Pupils: Pupils are equal, round, and reactive to light.  Neck:     Vascular: No carotid bruit.  Cardiovascular:     Rate and Rhythm: Normal rate and  regular rhythm.     Pulses: Normal pulses.     Heart sounds: Normal heart sounds. No murmur heard.   Pulmonary:     Effort: Pulmonary effort is normal.     Breath sounds: Normal breath sounds.  Abdominal:     Tenderness: There is no abdominal tenderness.  Musculoskeletal:        General: No swelling.  Skin:    General: Skin is warm and dry.  Neurological:     General: No focal deficit present.     Mental Status: She is alert and oriented to person, place, and time.  Psychiatric:        Mood and Affect: Mood normal.        Behavior: Behavior normal.        Thought Content: Thought content normal.        Judgment: Judgment normal.    ECHOCARDIOGRAM REPORT       Patient Name:  Danielle Ellis Date of Exam: 10/10/2020  Medical Rec #: [id number]    Height:    61.0 in  Accession #:  [id number]    Weight:    205.1 lb  Date of Birth: 12-Apr-1956    BSA:     1.909 m  Patient Age:  21 years     BP:      131/67 mmHg  Patient Gender: F        HR:      94 bpm.  Exam Location: Inpatient   Procedure: 2D Echo, Cardiac Doppler and Color Doppler   Indications:  Acute myocardial infarction    History:    Patient has no prior history of Echocardiogram  examinations.         CAD; Risk Factors:Diabetes, Hypertension and Dyslipidemia.    Sonographer:  Clayton Lefort RDCS (AE)  Referring Phys: [id number] [facility]     Sonographer Comments: Suboptimal subcostal window. Image acquisition  challenging due to patient body habitus.  IMPRESSIONS    1. Left ventricular ejection fraction, by estimation, is 70 to 75%. The  left ventricle has hyperdynamic function. The left ventricle has no  regional wall motion abnormalities. Left ventricular diastolic parameters  are consistent with Grade I diastolic  dysfunction (impaired relaxation).  2. Right ventricular systolic function is normal. The right ventricular   size is normal.  3. The mitral valve is normal in structure. No evidence of mitral valve  regurgitation. No evidence of mitral stenosis.  4. The aortic valve is normal in structure. Aortic valve regurgitation is  not visualized. Mild to moderate aortic valve sclerosis/calcification is  present, without any evidence of aortic stenosis.  5. The inferior vena cava is normal in size with greater than 50%  respiratory variability, suggesting right atrial pressure of 3 mmHg.   FINDINGS  Left Ventricle: Left ventricular ejection fraction, by estimation, is 70  to 75%. The left ventricle has hyperdynamic function. The left ventricle  has no regional wall motion abnormalities. Definity contrast agent was  given IV to delineate the left  ventricular endocardial borders. The left ventricular internal cavity size  was normal in size. There is no left ventricular hypertrophy. Left  ventricular diastolic parameters are consistent with Grade I diastolic  dysfunction (impaired relaxation).   Right Ventricle: The right ventricular size is normal. No increase in  right ventricular wall thickness. Right ventricular systolic function is  normal.   Left Atrium: Left atrial size was normal in size.   Right Atrium: Right atrial size was normal in size.   Pericardium: There is no evidence of pericardial effusion.   Mitral Valve: The mitral valve is normal in structure. Mild mitral annular  calcification. No evidence of mitral valve regurgitation. No evidence of  mitral valve stenosis. MV peak gradient, 5.6 mmHg. The mean mitral valve  gradient is 1.0 mmHg.   Tricuspid Valve: The tricuspid valve is normal in structure. Tricuspid  valve regurgitation is not demonstrated. No evidence of tricuspid  stenosis.   Aortic Valve: The aortic valve is normal in structure. Aortic valve  regurgitation is not visualized. Mild to moderate aortic valve  sclerosis/calcification is present, without any evidence  of aortic  stenosis. Aortic valve mean gradient measures 5.0 mmHg.  Aortic valve peak gradient measures 8.1 mmHg. Aortic valve area, by VTI  measures 2.19 cm.   Pulmonic Valve: The pulmonic valve was normal in structure. Pulmonic valve  regurgitation is not visualized. No evidence of pulmonic stenosis.   Aorta: The aortic root is normal in size and structure.   Venous: The inferior vena cava is normal in size with greater than 50%  respiratory variability, suggesting right atrial pressure of 3 mmHg.   IAS/Shunts: No atrial level shunt detected by color flow Doppler.     LEFT VENTRICLE  PLAX 2D  LVIDd:     4.00 cm Diastology  LVIDs:     2.10 cm LV e' medial:  4.24 cm/s  LV PW:     1.40 cm LV E/e' medial: 12.1  LV IVS:    1.10 cm LV e' lateral:  8.49 cm/s  LVOT diam:   2.00 cm LV E/e' lateral: 6.1  LV SV:     53  LV SV Index:  28  LVOT Area:   3.14 cm     RIGHT VENTRICLE  RV Basal diam: 2.80 cm  RV S prime:   21.30 cm/s  TAPSE (M-mode): 2.1 cm   LEFT ATRIUM      Index    RIGHT ATRIUM      Index  LA diam:   4.00 cm 2.10 cm/m RA Area:   11.20 cm  LA  Vol (A2C): 27.4 ml 14.35 ml/m RA Volume:  25.00 ml 13.10 ml/m  LA Vol (A4C): 46.8 ml 24.51 ml/m  AORTIC VALVE  AV Area (Vmax):  2.37 cm  AV Area (Vmean):  2.32 cm  AV Area (VTI):   2.19 cm  AV Vmax:      142.00 cm/s  AV Vmean:     102.000 cm/s  AV VTI:      0.242 m  AV Peak Grad:   8.1 mmHg  AV Mean Grad:   5.0 mmHg  LVOT Vmax:     107.00 cm/s  LVOT Vmean:    75.400 cm/s  LVOT VTI:     0.169 m  LVOT/AV VTI ratio: 0.70    AORTA  Ao Root diam: 3.00 cm  Ao Asc diam: 3.10 cm   MITRAL VALVE  MV Area (PHT): 3.89 cm   SHUNTS  MV Area VTI:  2.54 cm   Systemic VTI: 0.17 m  MV Peak grad: 5.6 mmHg   Systemic Diam: 2.00 cm  MV Mean grad: 1.0 mmHg  MV Vmax:    1.18 m/s  MV Vmean:   54.7 cm/s  MV  Decel Time: 195 msec  MV E velocity: 51.40 cm/s  MV A velocity: 105.00 cm/s  MV E/A ratio: 0.49   Candee Furbish MD  Electronically signed by Candee Furbish MD  Signature Date/Time: 10/10/2020/2:17:26 PM     Physicians  Panel Physicians Referring Physician Case Authorizing Physician  Martinique, Peter M, MD (Primary)      Procedures  LEFT HEART CATH AND CORONARY ANGIOGRAPHY   Conclusion    Prox LAD to Mid LAD lesion is 60% stenosed.  Mid LAD lesion is 60% stenosed.  Prox Cx to Mid Cx lesion is 85% stenosed.  1st Diag lesion is 90% stenosed.  Prox RCA lesion is 90% stenosed.  Mid RCA to Dist RCA lesion is 99% stenosed.  The left ventricular systolic function is normal.  LV end diastolic pressure is normal.  The left ventricular ejection fraction is 55-65% by visual estimate.   1. 3 vessel obstructive CAD   - long 60% areas of stenosis in the proximal and mid LAD with abnormal RFR of 0.85   - 90% ostial first diagonal   -85% proximal to mid LCx   - 90% proximal RCA. Long 99% mid RCA 2. Normal LV function 3. Normal LVEDP  Plan: recommend CT surgery consultation for CABG    Recommendations  Antiplatelet/Anticoag Recommend Aspirin 2m daily for moderate CAD.   Indications  Non-ST elevation (NSTEMI) myocardial infarction (HDaniel [I21.4 (ICD-10-CM)]   Procedural Details  Technical Details Indication: 65yo WF with DM, HTN, HLD presents with a NSTEMI  Procedural Details: The right wrist was prepped, draped, and anesthetized with 1% lidocaine. Using the modified Seldinger technique, a 6 French slender sheath was introduced into the right radial artery. 3 mg of verapamil was administered through the sheath, weight-based unfractionated heparin was administered intravenously. Standard Judkins catheters were used for selective coronary angiography and left ventriculography. Catheter exchanges were performed over an exchange length guidewire. We performed RFR of the LAD.  The patient was fully anticoagulated. An XBLAD 3.5 guide was used. A Guidewire pressure wire was used to cross the proximal and mid LAD. RFR was abnormal at 0.85 indicating the disease in the LAD is hemodynamically significant. There were no immediate procedural complications. A TR band was used for radial hemostasis at the completion of the procedure.  The patient was transferred to the  post catheterization recovery area for further monitoring. Contrast: 80 cc Estimated blood loss <50 mL.   During this procedure medications were administered to achieve and maintain moderate conscious sedation while the patient's heart rate, blood pressure, and oxygen saturation were continuously monitored and I was present face-to-face 100% of this time.   Medications (Filter: Administrations occurring from 1255 to 1405 on 10/10/20) (important) Continuous medications are totaled by the amount administered until 10/10/20 1405.    Heparin (Porcine) in NaCl 1000-0.9 UT/500ML-% SOLN (mL) Total volume:  1,000 mL  Date/Time Rate/Dose/Volume Action   10/10/20 1306 500 mL Given   1306 500 mL Given    fentaNYL (SUBLIMAZE) injection (mcg) Total dose:  25 mcg  Date/Time Rate/Dose/Volume Action   10/10/20 1307 25 mcg Given    midazolam (VERSED) injection (mg) Total dose:  2 mg  Date/Time Rate/Dose/Volume Action   10/10/20 1307 2 mg Given    lidocaine (PF) (XYLOCAINE) 1 % injection (mL) Total volume:  2 mL  Date/Time Rate/Dose/Volume Action   10/10/20 1318 2 mL Given    Radial Cocktail/Verapamil only (mL) Total volume:  10 mL  Date/Time Rate/Dose/Volume Action   10/10/20 1321 10 mL Given    heparin sodium (porcine) injection (Units) Total dose:  10,000 Units  Date/Time Rate/Dose/Volume Action   10/10/20 1321 5,000 Units Given   1329 5,000 Units Given    nitroGLYCERIN 1 mg/10 mL (100 mcg/mL) - IR/CATH LAB (mcg) Total dose:  200 mcg  Date/Time Rate/Dose/Volume Action   10/10/20 1338 200 mcg  Given    iohexol (OMNIPAQUE) 350 MG/ML injection (mL) Total volume:  80 mL  Date/Time Rate/Dose/Volume Action   10/10/20 1345 80 mL Given    acetaminophen (TYLENOL) tablet 650 mg (mg) Total dose:  Cannot be calculated* Dosing weight:  95.3  *Administration dose not documented Date/Time Rate/Dose/Volume Action   10/10/20 1301 *Not included in total MAR Hold   1404 *Not included in total MAR Unhold    aspirin EC tablet 81 mg (mg) Total dose:  Cannot be calculated* Dosing weight:  95.3  *Administration dose not documented Date/Time Rate/Dose/Volume Action   10/10/20 1301 *Not included in total MAR Hold   1404 *Not included in total MAR Unhold    calcium carbonate (OS-CAL - dosed in mg of elemental calcium) tablet 500 mg of elemental calcium (tablet) Total dose:  Cannot be calculated* Dosing weight:  93.4  *Administration dose not documented Date/Time Rate/Dose/Volume Action   10/10/20 1301 *Not included in total MAR Hold   1404 *Not included in total MAR Unhold    escitalopram (LEXAPRO) tablet 10 mg (mg) Total dose:  Cannot be calculated* Dosing weight:  95.3  *Administration dose not documented Date/Time Rate/Dose/Volume Action   10/10/20 1301 *Not included in total MAR Hold   1404 *Not included in total MAR Unhold    insulin aspart (novoLOG) injection 0-15 Units (Units) Total dose:  Cannot be calculated* Dosing weight:  95.3  *Administration dose not documented Date/Time Rate/Dose/Volume Action   10/10/20 1301 *Not included in total MAR Hold   1404 *Not included in total MAR Unhold    insulin aspart (novoLOG) injection 0-5 Units (Units) Total dose:  Cannot be calculated* Dosing weight:  95.3  *Administration dose not documented Date/Time Rate/Dose/Volume Action   10/10/20 1301 *Not included in total MAR Hold   1404 *Not included in total MAR Unhold    latanoprost (XALATAN) 0.005 % ophthalmic solution 1 drop (drop) Total dose:  Cannot be  calculated*  *Administration dose  not documented Date/Time Rate/Dose/Volume Action   10/10/20 1301 *Not included in total MAR Hold   1404 *Not included in total MAR Unhold    multivitamin with minerals tablet 1 tablet (tablet) Total dose:  Cannot be calculated* Dosing weight:  95.3  *Administration dose not documented Date/Time Rate/Dose/Volume Action   10/10/20 1301 *Not included in total MAR Hold   1404 *Not included in total MAR Unhold    nitroGLYCERIN (NITROSTAT) SL tablet 0.4 mg (mg) Total dose:  Cannot be calculated* Dosing weight:  95.3  *Administration dose not documented Date/Time Rate/Dose/Volume Action   10/10/20 1301 *Not included in total MAR Hold   1404 *Not included in total MAR Unhold    ondansetron (ZOFRAN) injection 4 mg (mg) Total dose:  Cannot be calculated* Dosing weight:  95.3  *Administration dose not documented Date/Time Rate/Dose/Volume Action   10/10/20 1301 *Not included in total MAR Hold   1404 *Not included in total MAR Unhold    rosuvastatin (CRESTOR) tablet 5 mg (mg) Total dose:  Cannot be calculated* Dosing weight:  95.3  *Administration dose not documented Date/Time Rate/Dose/Volume Action   10/10/20 1301 *Not included in total MAR Hold   1404 *Not included in total MAR Unhold    valACYclovir (VALTREX) tablet 2,000 mg (mg) Total dose:  Cannot be calculated* Dosing weight:  95.3  *Administration dose not documented Date/Time Rate/Dose/Volume Action   10/10/20 1301 *Not included in total MAR Hold   1404 *Not included in total MAR Unhold    perflutren lipid microspheres (DEFINITY) IV suspension (mL) Total dose:  Cannot be calculated* Dosing weight:  93  *Administration dose not documented Date/Time Rate/Dose/Volume Action   10/10/20 1301 *Not included in total MAR Hold   1404 *Not included in total MAR Unhold    Sedation Time  Sedation Time Physician-1: 37 minutes 16 seconds   Contrast  Medication Name Total Dose  iohexol  (OMNIPAQUE) 350 MG/ML injection 80 mL    Radiation/Fluoro  Fluoro time: 6.8 (min) DAP: 22.4 (Gycm2) Cumulative Air Kerma: 893 (mGy)   Complications   Complications documented before study signed (10/10/2020 7:34 PM)    No complications were associated with this study.  Documented by Martinique, Peter M, MD - 10/10/2020 1:54 PM     Coronary Findings   Diagnostic Dominance: Right  Left Main  Vessel was injected. Vessel is normal in caliber. Vessel is angiographically normal.  Left Anterior Descending  Prox LAD to Mid LAD lesion is 60% stenosed.  Mid LAD lesion is 60% stenosed. Pressure wire/FFR was performed on the lesion. RFR is 0.85  First Diagonal Branch  1st Diag lesion is 90% stenosed.  Left Circumflex  Prox Cx to Mid Cx lesion is 85% stenosed.  Right Coronary Artery  Prox RCA lesion is 90% stenosed.  Mid RCA to Dist RCA lesion is 99% stenosed.   Intervention   No interventions have been documented.  Wall Motion  Resting       All segments of the heart are normal.           Left Heart  Left Ventricle The left ventricular size is normal. The left ventricular systolic function is normal. LV end diastolic pressure is normal. The left ventricular ejection fraction is 55-65% by visual estimate. No regional wall motion abnormalities.   Coronary Diagrams   Diagnostic Dominance: Right    Intervention     Assessment/Plan:  This 65 year old woman has severe three-vessel coronary disease with unstable anginal symptoms presenting with a non-ST segment  elevation MI with mildly elevated troponin.  I agree that coronary bypass graft surgery is the best treatment for relief of her symptoms and to preserve myocardium.  I discussed the operative procedure with the patient and her daughter and husband including alternatives, benefits and risks; including but not limited to bleeding, blood transfusion, infection, stroke, myocardial infarction, graft failure,  heart block requiring a permanent pacemaker, organ dysfunction, and death.  Danielle Ellis understands and agrees to proceed.  We will schedule surgery for Tuesday morning.  I spent 40 minutes performing this consultation and > 50% of this time was spent face to face counseling and coordinating the care of this patient's severe multivessel coronary disease.  Gaye Pollack 10/10/2020, 4:53 PM

## 2020-10-10 NOTE — Progress Notes (Signed)
 Progress Note  Patient Name: Danielle Ellis Date of Encounter: 10/10/2020  [facility] Cardiologist: Peter Jordan, MD   Subjective   Pt states she has been chest pain free since nitro administered by EMS yesterday. She reports intermittent CP for the last three weeks. She does not have exertional chest pain - CP can be associated with SOB, diaphoresis, and nausea, but is generally at rest after she has done house chores. She is somewhat sedentary since her ankle fracture - grocery shopping exhausts her.   Inpatient Medications    Scheduled Meds: . aspirin  324 mg Oral NOW   Or  . aspirin  300 mg Rectal NOW  . aspirin EC  81 mg Oral Daily  . calcium carbonate  1 tablet Oral Q breakfast  . escitalopram  10 mg Oral Daily  . irbesartan  150 mg Oral Daily   And  . hydrochlorothiazide  25 mg Oral Daily  . insulin aspart  0-15 Units Subcutaneous TID WC  . insulin aspart  0-5 Units Subcutaneous QHS  . latanoprost  1 drop Both Eyes QHS  . multivitamin with minerals  1 tablet Oral Daily  . rosuvastatin  5 mg Oral QODAY  . valACYclovir  2,000 mg Oral BID   Continuous Infusions: . heparin 1,000 Units/hr (10/09/20 1928)   PRN Meds: acetaminophen, nitroGLYCERIN, ondansetron (ZOFRAN) IV   Vital Signs    Vitals:   10/09/20 2230 10/10/20 0044 10/10/20 0500 10/10/20 0612  BP: 138/81 140/87  138/75  Pulse: 87 95  94  Resp: 20 20    Temp:  98.3 F (36.8 C)  97.9 F (36.6 C)  TempSrc:  Oral  Oral  SpO2: 97% 98%  97%  Weight:  93.4 kg 93 kg   Height:  5' 1" (1.549 m)     No intake or output data in the 24 hours ending 10/10/20 0648 Last 3 Weights 10/10/2020 10/10/2020 10/09/2020  Weight (lbs) 205 lb 1.6 oz 206 lb 210 lb 1.6 oz  Weight (kg) 93.033 kg 93.441 kg 95.3 kg      Telemetry    Sinus rhythm HR 80 - Personally Reviewed  ECG    Sinus rhythm HR 88, TWI inferior leads  - Personally Reviewed  Physical Exam   GEN: No acute distress.   Neck: No JVD Cardiac: RRR, no  murmurs, rubs, or gallops.  Respiratory: Clear to auscultation bilaterally. GI: Soft, nontender, non-distended  MS: No edema; No deformity. Neuro:  Nonfocal  Psych: Normal affect   Labs    High Sensitivity Troponin:   Recent Labs  Lab 10/09/20 1848 10/09/20 2103 10/09/20 2324 10/10/20 0144  TROPONINIHS 188* 156* 217* 195*      Chemistry Recent Labs  Lab 10/03/20 0833 10/09/20 1848  NA 139 137  K 3.8 3.2*  CL 99 101  CO2 21 22  GLUCOSE 125* 119*  BUN 18 18  CREATININE 0.88 0.87  CALCIUM 10.1 9.4  PROT 7.5  --   ALBUMIN 4.9*  --   AST 32  --   ALT 41*  --   ALKPHOS 132*  --   BILITOT 0.5  --   GFRNONAA 69 >60  GFRAA 79  --   ANIONGAP  --  14     Hematology Recent Labs  Lab 10/03/20 0833 10/09/20 1848 10/10/20 0445  WBC 5.9 7.8 8.4  RBC 5.11 4.88 4.89  HGB 14.5 14.0 13.9  HCT 44.6 43.3 42.6  MCV 87 88.7 87.1  MCH   28.4 28.7 28.4  MCHC 32.5 32.3 32.6  RDW 13.5 13.3 13.2  PLT 199 220 215    BNPNo results for input(s): BNP, PROBNP in the last 168 hours.   DDimer No results for input(s): DDIMER in the last 168 hours.   Radiology    DG Chest Portable 1 View  Result Date: 10/09/2020 CLINICAL DATA:  Chest pain EXAM: PORTABLE CHEST 1 VIEW COMPARISON:  None. FINDINGS: The heart size and mediastinal contours are within normal limits. Mild aortic atherosclerosis. Both lungs are clear. The visualized skeletal structures are unremarkable. IMPRESSION: No active disease. Electronically Signed   By: Jasmine Pang M.D.   On: 10/09/2020 19:27    Cardiac Studies   Echo pending  Patient Profile     65 y.o. female with a history of HTN, HLD, pre-diabetes, but no hx of CAD, presented to ED with chest pain found to have an elevated troponin.   Assessment & Plan    NSTEMI CAD - hs troponin 156 --> 217 --> 195 - EKG with TWI inferior leads, long QT - chest pain concerning for unstable angina - will proceed with heart cath today   Hypertension - continue  ARB-HCTZ - pressure controlled   Hyperlipidemia 10/10/2020: Cholesterol 272; HDL 33; LDL Cholesterol 182; Triglycerides 285; VLDL 57 - on 5 mg crestor every other day   Pre-diabetes - A1c pending - SSI - home metformin on hold    For questions or updates, please contact [facility] Please consult [email or web address] for contact info under        Signed, Marcelino Duster, PA  10/10/2020, 6:48 AM

## 2020-10-11 ENCOUNTER — Inpatient Hospital Stay (HOSPITAL_COMMUNITY): Payer: Medicare Other

## 2020-10-11 DIAGNOSIS — Z01810 Encounter for preprocedural cardiovascular examination: Secondary | ICD-10-CM

## 2020-10-11 DIAGNOSIS — I214 Non-ST elevation (NSTEMI) myocardial infarction: Secondary | ICD-10-CM | POA: Diagnosis not present

## 2020-10-11 LAB — CBC
HCT: 39.3 % (ref 36.0–46.0)
Hemoglobin: 12.3 g/dL (ref 12.0–15.0)
MCH: 28 pg (ref 26.0–34.0)
MCHC: 31.3 g/dL (ref 30.0–36.0)
MCV: 89.5 fL (ref 80.0–100.0)
Platelets: 179 10*3/uL (ref 150–400)
RBC: 4.39 MIL/uL (ref 3.87–5.11)
RDW: 13.4 % (ref 11.5–15.5)
WBC: 7.6 10*3/uL (ref 4.0–10.5)
nRBC: 0 % (ref 0.0–0.2)

## 2020-10-11 LAB — HEPARIN LEVEL (UNFRACTIONATED)
Heparin Unfractionated: 0.27 IU/mL — ABNORMAL LOW (ref 0.30–0.70)
Heparin Unfractionated: 0.27 IU/mL — ABNORMAL LOW (ref 0.30–0.70)
Heparin Unfractionated: 0.42 IU/mL (ref 0.30–0.70)

## 2020-10-11 LAB — GLUCOSE, CAPILLARY
Glucose-Capillary: 103 mg/dL — ABNORMAL HIGH (ref 70–99)
Glucose-Capillary: 152 mg/dL — ABNORMAL HIGH (ref 70–99)
Glucose-Capillary: 96 mg/dL (ref 70–99)
Glucose-Capillary: 106 mg/dL — ABNORMAL HIGH (ref 70–99)

## 2020-10-11 MED ORDER — THE SENSUOUS HEART BOOK
Freq: Once | Status: AC
  Filled 2020-10-11: qty 1

## 2020-10-11 MED ORDER — METOPROLOL TARTRATE 25 MG PO TABS
25.0000 mg | ORAL_TABLET | Freq: Two times a day (BID) | ORAL | Status: DC
  Administered 2020-10-11 – 2020-10-13 (×6): 25 mg via ORAL
  Filled 2020-10-11 (×6): qty 1

## 2020-10-11 MED ORDER — HEART ATTACK BOUNCING BOOK
Freq: Once | Status: AC
  Filled 2020-10-11: qty 1

## 2020-10-11 MED ORDER — ANGIOPLASTY BOOK
Freq: Once | Status: AC
  Filled 2020-10-11: qty 1

## 2020-10-11 MED ORDER — ALPRAZOLAM 0.25 MG PO TABS
0.2500 mg | ORAL_TABLET | Freq: Three times a day (TID) | ORAL | Status: DC | PRN
  Administered 2020-10-11 – 2020-10-16 (×9): 0.25 mg via ORAL
  Filled 2020-10-11 (×9): qty 1

## 2020-10-11 NOTE — Plan of Care (Signed)
  Problem: Clinical Measurements: Goal: Respiratory complications will improve Outcome: Progressing   Problem: Activity: Goal: Risk for activity intolerance will decrease Outcome: Progressing   Problem: Coping: Goal: Level of anxiety will decrease Outcome: Progressing   Problem: Pain Managment: Goal: General experience of comfort will improve Outcome: Progressing   

## 2020-10-11 NOTE — Progress Notes (Signed)
Progress Note  Patient Name: Danielle Ellis Date of Encounter: 10/11/2020  [facility] Cardiologist: Peter SwazilandJordan, MD   Subjective   No complaints  Inpatient Medications    Scheduled Meds: . aspirin EC  81 mg Oral Daily  . calcium carbonate  1 tablet Oral Q breakfast  . escitalopram  10 mg Oral Daily  . irbesartan  150 mg Oral Daily   And  . hydrochlorothiazide  25 mg Oral Daily  . insulin aspart  0-15 Units Subcutaneous TID WC  . insulin aspart  0-5 Units Subcutaneous QHS  . isosorbide mononitrate  30 mg Oral Daily  . latanoprost  1 drop Both Eyes QHS  . multivitamin with minerals  1 tablet Oral Daily  . rosuvastatin  5 mg Oral QODAY  . sodium chloride flush  3 mL Intravenous Q12H  . valACYclovir  2,000 mg Oral BID   Continuous Infusions: . sodium chloride    . heparin 1,100 Units/hr (10/11/20 0558)   PRN Meds: sodium chloride, acetaminophen, nitroGLYCERIN, ondansetron (ZOFRAN) IV, sodium chloride flush   Vital Signs    Vitals:   10/10/20 1638 10/10/20 1938 10/11/20 0519 10/11/20 0818  BP: 136/75 (!) 143/80 (!) 167/88 (!) 161/88  Pulse: 92 95 85 81  Resp: 17 16 15 16   Temp: 99.1 F (37.3 C) 98.7 F (37.1 C) 98.4 F (36.9 C) 98.1 F (36.7 C)  TempSrc: Oral Oral Oral Oral  SpO2: 96% 96% 94% 98%  Weight:   94 kg   Height:        Intake/Output Summary (Last 24 hours) at 10/11/2020 0913 Last data filed at 10/10/2020 1600 Gross per 24 hour  Intake 849.65 ml  Output --  Net 849.65 ml   Last 3 Weights 10/11/2020 10/10/2020 10/10/2020  Weight (lbs) 207 lb 3.2 oz 205 lb 1.6 oz 206 lb  Weight (kg) 93.985 kg 93.033 kg 93.441 kg      Telemetry    NSR - Personally Reviewed  ECG    n/a - Personally Reviewed  Physical Exam   GEN: No acute distress.   Neck: No JVD Cardiac: RRR, no murmurs, rubs, or gallops.  Respiratory: Clear to auscultation bilaterally. GI: Soft, nontender, non-distended  MS: No edema; No deformity. Neuro:  Nonfocal  Psych:  Normal affect   Labs    High Sensitivity Troponin:   Recent Labs  Lab 10/09/20 1848 10/09/20 2103 10/09/20 2324 10/10/20 0144  TROPONINIHS 188* 156* 217* 195*      Chemistry Recent Labs  Lab 10/10/20 0144 10/10/20 1043 10/10/20 1210  NA 141 140 140  K 2.8* 3.1* 3.6  CL 101 102 103  CO2 22 22 20*  GLUCOSE 149* 107* 107*  BUN 16 17 16   CREATININE 0.91 0.88 0.94  CALCIUM 9.7 9.6 9.7  GFRNONAA >60 >60 >60  ANIONGAP 18* 16* 17*     Hematology Recent Labs  Lab 10/09/20 1848 10/10/20 0445 10/11/20 0450  WBC 7.8 8.4 7.6  RBC 4.88 4.89 4.39  HGB 14.0 13.9 12.3  HCT 43.3 42.6 39.3  MCV 88.7 87.1 89.5  MCH 28.7 28.4 28.0  MCHC 32.3 32.6 31.3  RDW 13.3 13.2 13.4  PLT 220 215 179    BNPNo results for input(s): BNP, PROBNP in the last 168 hours.   DDimer No results for input(s): DDIMER in the last 168 hours.   Radiology    CARDIAC CATHETERIZATION  Result Date: 10/10/2020  Prox LAD to Mid LAD lesion is 60% stenosed.  Mid  LAD lesion is 60% stenosed.  Prox Cx to Mid Cx lesion is 85% stenosed.  1st Diag lesion is 90% stenosed.  Prox RCA lesion is 90% stenosed.  Mid RCA to Dist RCA lesion is 99% stenosed.  The left ventricular systolic function is normal.  LV end diastolic pressure is normal.  The left ventricular ejection fraction is 55-65% by visual estimate.  1. 3 vessel obstructive CAD   - long 60% areas of stenosis in the proximal and mid LAD with abnormal RFR of 0.85   - 90% ostial first diagonal   -85% proximal to mid LCx   - 90% proximal RCA. Long 99% mid RCA 2. Normal LV function 3. Normal LVEDP Plan: recommend CT surgery consultation for CABG   DG Chest Portable 1 View  Result Date: 10/09/2020 CLINICAL DATA:  Chest pain EXAM: PORTABLE CHEST 1 VIEW COMPARISON:  None. FINDINGS: The heart size and mediastinal contours are within normal limits. Mild aortic atherosclerosis. Both lungs are clear. The visualized skeletal structures are unremarkable. IMPRESSION:  No active disease. Electronically Signed   By: Jasmine Pang M.D.   On: 10/09/2020 19:27   ECHOCARDIOGRAM COMPLETE  Result Date: 10/10/2020    ECHOCARDIOGRAM REPORT   Patient Name:   Danielle Ellis Date of Exam: 10/10/2020 Medical Rec #:  [id number]        Height:       61.0 in Accession #:    [id number]       Weight:       205.1 lb Date of Birth:  [birth year]        BSA:          1.909 m Patient Age:    64 years         BP:           131/67 mmHg Patient Gender: F                HR:           94 bpm. Exam Location:  Inpatient Procedure: 2D Echo, Cardiac Doppler and Color Doppler Indications:    Acute myocardial infarction  History:        Patient has no prior history of Echocardiogram examinations.                 CAD; Risk Factors:Diabetes, Hypertension and Dyslipidemia.  Sonographer:    Ross Ludwig RDCS (AE) Referring Phys: [id number] [facility]  Sonographer Comments: Suboptimal subcostal window. Image acquisition challenging due to patient body habitus. IMPRESSIONS  1. Left ventricular ejection fraction, by estimation, is 70 to 75%. The left ventricle has hyperdynamic function. The left ventricle has no regional wall motion abnormalities. Left ventricular diastolic parameters are consistent with Grade I diastolic dysfunction (impaired relaxation).  2. Right ventricular systolic function is normal. The right ventricular size is normal.  3. The mitral valve is normal in structure. No evidence of mitral valve regurgitation. No evidence of mitral stenosis.  4. The aortic valve is normal in structure. Aortic valve regurgitation is not visualized. Mild to moderate aortic valve sclerosis/calcification is present, without any evidence of aortic stenosis.  5. The inferior vena cava is normal in size with greater than 50% respiratory variability, suggesting right atrial pressure of 3 mmHg. FINDINGS  Left Ventricle: Left ventricular ejection fraction, by estimation, is 70 to 75%. The left ventricle has  hyperdynamic function. The left ventricle has no regional wall motion abnormalities. Definity contrast agent was given IV to delineate the left  ventricular endocardial borders. The left ventricular internal cavity size was normal in size. There is no left ventricular hypertrophy. Left ventricular diastolic parameters are consistent with Grade I diastolic dysfunction (impaired relaxation). Right Ventricle: The right ventricular size is normal. No increase in right ventricular wall thickness. Right ventricular systolic function is normal. Left Atrium: Left atrial size was normal in size. Right Atrium: Right atrial size was normal in size. Pericardium: There is no evidence of pericardial effusion. Mitral Valve: The mitral valve is normal in structure. Mild mitral annular calcification. No evidence of mitral valve regurgitation. No evidence of mitral valve stenosis. MV peak gradient, 5.6 mmHg. The mean mitral valve gradient is 1.0 mmHg. Tricuspid Valve: The tricuspid valve is normal in structure. Tricuspid valve regurgitation is not demonstrated. No evidence of tricuspid stenosis. Aortic Valve: The aortic valve is normal in structure. Aortic valve regurgitation is not visualized. Mild to moderate aortic valve sclerosis/calcification is present, without any evidence of aortic stenosis. Aortic valve mean gradient measures 5.0 mmHg. Aortic valve peak gradient measures 8.1 mmHg. Aortic valve area, by VTI measures 2.19 cm. Pulmonic Valve: The pulmonic valve was normal in structure. Pulmonic valve regurgitation is not visualized. No evidence of pulmonic stenosis. Aorta: The aortic root is normal in size and structure. Venous: The inferior vena cava is normal in size with greater than 50% respiratory variability, suggesting right atrial pressure of 3 mmHg. IAS/Shunts: No atrial level shunt detected by color flow Doppler.  LEFT VENTRICLE PLAX 2D LVIDd:         4.00 cm  Diastology LVIDs:         2.10 cm  LV e' medial:    4.24  cm/s LV PW:         1.40 cm  LV E/e' medial:  12.1 LV IVS:        1.10 cm  LV e' lateral:   8.49 cm/s LVOT diam:     2.00 cm  LV E/e' lateral: 6.1 LV SV:         53 LV SV Index:   28 LVOT Area:     3.14 cm  RIGHT VENTRICLE RV Basal diam:  2.80 cm RV S prime:     21.30 cm/s TAPSE (M-mode): 2.1 cm LEFT ATRIUM           Index       RIGHT ATRIUM           Index LA diam:      4.00 cm 2.10 cm/m  RA Area:     11.20 cm LA Vol (A2C): 27.4 ml 14.35 ml/m RA Volume:   25.00 ml  13.10 ml/m LA Vol (A4C): 46.8 ml 24.51 ml/m  AORTIC VALVE AV Area (Vmax):    2.37 cm AV Area (Vmean):   2.32 cm AV Area (VTI):     2.19 cm AV Vmax:           142.00 cm/s AV Vmean:          102.000 cm/s AV VTI:            0.242 m AV Peak Grad:      8.1 mmHg AV Mean Grad:      5.0 mmHg LVOT Vmax:         107.00 cm/s LVOT Vmean:        75.400 cm/s LVOT VTI:          0.169 m LVOT/AV VTI ratio: 0.70  AORTA Ao Root diam: 3.00 cm Ao Asc diam:  3.10 cm MITRAL VALVE MV Area (PHT): 3.89 cm     SHUNTS MV Area VTI:   2.54 cm     Systemic VTI:  0.17 m MV Peak grad:  5.6 mmHg     Systemic Diam: 2.00 cm MV Mean grad:  1.0 mmHg MV Vmax:       1.18 m/s MV Vmean:      54.7 cm/s MV Decel Time: 195 msec MV E velocity: 51.40 cm/s MV A velocity: 105.00 cm/s MV E/A ratio:  0.49 Donato Schultz MD Electronically signed by Donato Schultz MD Signature Date/Time: 10/10/2020/2:17:26 PM    Final     Cardiac Studies     Patient Profile     65 y.o. female with a history of HTN, HLD, pre-diabetes, but no hx of CAD, presented to ED with chest pain found to have an elevated troponin.   Assessment & Plan    1. NSTEMI - peak trop 217 trending down - cath with prox LAD 60%, mid LAD 60%, prox LCX 85%, D1 90%, prox RCA 90% and mid 99%.  -echo LVE 70-75%, no WMAs, grade I dd  - CT surgery consulted, recs are for CABG with plans for Tuesday - medical therapy with ASA 81, hep gtt, irbesartan 150, imdur 30, crestor 5. Start lopresor 25mg  bid   2. Hyperlipidemia - LDL  182, intolerant to atorva. Started on crestor 5mg  daily  3. HTN - follow today, just starting lopressor which may or may not have much effect. May alter regimen depending on further trends.    For questions or updates, please contact [facility] Please consult [email or web address] for contact info under        Signed, , MD  10/11/2020, 9:13 AM

## 2020-10-11 NOTE — Progress Notes (Signed)
ANTICOAGULATION CONSULT NOTE  Pharmacy Consult for heparin gtt  Indication: chest pain/ACS   Labs: Recent Labs    10/09/20 1848 10/09/20 1848 10/10/20 0144 10/10/20 0445 10/10/20 1043 10/10/20 1210 10/11/20 0450 10/11/20 1131  HGB 14.0  --   --  13.9  --   --  12.3  --   HCT 43.3  --   --  42.6  --   --  39.3  --   PLT 220  --   --  215  --   --  179  --   LABPROT  --   --  13.0  --   --   --   --   --   INR  --   --  1.0  --   --   --   --   --   HEPARINUNFRC  --    < > 0.28* 0.34  --   --  0.27* 0.27*  CREATININE 0.87  --  0.91  --  0.88 0.94  --   --    < > = values in this interval not displayed.    Assessment: Danielle Ellis a 65 y.o. female on heparin for the indication of chest pain/ACS.  S/p cath 2/25, found 3 vessel CAD. Now for CT surgery consultation eval for CABG--planned for 3/1. Pharmacy consulted to resume heparin.   Heparin level remains subtherapeutic at 0.27 despite rate increase to 1100 units/hr.  No issues noted with infusion.  No bleeding reported. H/H and plt wnl.   Goal of Therapy:  Heparin level 0.3-0.7 units/ml Monitor platelets by anticoagulation protocol: Yes   Plan:  Increase heparin drip to 1250 units/hr 6h heparin level Daily heparin level, CBC while on heparin Monitor s/s bleeding   Pervis Hocking, PharmD PGY1 Pharmacy Resident 10/11/2020 1:03 PM  Please check [email or web address] for unit-specific pharmacy phone numbers.

## 2020-10-11 NOTE — Progress Notes (Signed)
ANTICOAGULATION CONSULT NOTE  Pharmacy Consult for heparin gtt  Indication: chest pain/ACS   Labs: Recent Labs    10/09/20 1848 10/09/20 1848 10/10/20 0144 10/10/20 0445 10/10/20 1043 10/10/20 1210 10/11/20 0450 10/11/20 1131 10/11/20 1838  HGB 14.0  --   --  13.9  --   --  12.3  --   --   HCT 43.3  --   --  42.6  --   --  39.3  --   --   PLT 220  --   --  215  --   --  179  --   --   LABPROT  --   --  13.0  --   --   --   --   --   --   INR  --   --  1.0  --   --   --   --   --   --   HEPARINUNFRC  --    < > 0.28* 0.34  --   --  0.27* 0.27* 0.42  CREATININE 0.87  --  0.91  --  0.88 0.94  --   --   --    < > = values in this interval not displayed.    Assessment: Danielle Ellis a 65 y.o. female on heparin for the indication of chest pain/ACS.  S/p cath 2/25, found 3 vessel CAD.  CT surgery consultation eval for CABG--planned for 3/1. Pharmacy consulted to resume heparin.   Heparin level therapeutic at 0.42 after heparin drip rate increased 1250 uts/hr earlier today.   No issues noted with infusion.  No bleeding reported. H/H and plt wnl.   Goal of Therapy:  Heparin level 0.3-0.7 units/ml Monitor platelets by anticoagulation protocol: Yes   Plan:  Continue heparin drip to 1250 units/hr Daily heparin level, CBC while on heparin Monitor s/s bleeding    Leota Sauers Pharm.D. CPP, BCPS Clinical Pharmacist [phone] 10/11/2020 7:53 PM   Please check [email or web address] for unit-specific pharmacy phone numbers.

## 2020-10-11 NOTE — Progress Notes (Signed)
Pre-CABG ultrasound study completed.  ° °Please see CV Proc for preliminary results.  ° °Layli Capshaw, RDMS, RVT ° °

## 2020-10-11 NOTE — Progress Notes (Signed)
ANTICOAGULATION CONSULT NOTE  Pharmacy Consult for heparin gtt  Indication: chest pain/ACS   Labs: Recent Labs    10/09/20 1848 10/10/20 0144 10/10/20 0445 10/10/20 1043 10/10/20 1210 10/11/20 0450  HGB 14.0  --  13.9  --   --  12.3  HCT 43.3  --  42.6  --   --  39.3  PLT 220  --  215  --   --  179  LABPROT  --  13.0  --   --   --   --   INR  --  1.0  --   --   --   --   HEPARINUNFRC  --  0.28* 0.34  --   --  0.27*  CREATININE 0.87 0.91  --  0.88 0.94  --     Assessment: Danielle Ellis a 65 y.o. female on heparin for the indication of  chest pain/ACS.  S/p cath today, found 3 vessel CAD. Now for CT surgery consultation eval for CABG. Pharmacy consulted to resume heparin Heparin level this am 0.27 units/ml.  No issues noted with infusion.  No bleeding reported  Goal of Therapy:  Heparin level 0.3-0.7 units/ml Monitor platelets by anticoagulation protocol: Yes   Plan:  Increase heparin drip to 1100 units/hr 6h heparin level  Daily heparin level, CBC while on heparin Monitor s/s bleeding  Thanks for allowing pharmacy to be a part of this patient's care.  Talbert Cage, PharmD Clinical Pharmacist

## 2020-10-12 ENCOUNTER — Inpatient Hospital Stay (HOSPITAL_COMMUNITY): Payer: Medicare Other

## 2020-10-12 DIAGNOSIS — I214 Non-ST elevation (NSTEMI) myocardial infarction: Secondary | ICD-10-CM | POA: Diagnosis not present

## 2020-10-12 LAB — BASIC METABOLIC PANEL
Anion gap: 10 (ref 5–15)
BUN: 12 mg/dL (ref 8–23)
CO2: 26 mmol/L (ref 22–32)
Calcium: 9.4 mg/dL (ref 8.9–10.3)
Chloride: 104 mmol/L (ref 98–111)
Creatinine, Ser: 0.88 mg/dL (ref 0.44–1.00)
GFR, Estimated: >60 mL/min (ref >60)
Glucose, Bld: 105 mg/dL — ABNORMAL HIGH (ref 70–99)
Potassium: 3.8 mmol/L (ref 3.5–5.1)
Sodium: 140 mmol/L (ref 135–145)

## 2020-10-12 LAB — PROTIME-INR
INR: 1 (ref 0.8–1.2)
Prothrombin Time: 13 seconds (ref 11.4–15.2)

## 2020-10-12 LAB — GLUCOSE, CAPILLARY
Glucose-Capillary: 102 mg/dL — ABNORMAL HIGH (ref 70–99)
Glucose-Capillary: 127 mg/dL — ABNORMAL HIGH (ref 70–99)
Glucose-Capillary: 108 mg/dL — ABNORMAL HIGH (ref 70–99)
Glucose-Capillary: 82 mg/dL (ref 70–99)
Glucose-Capillary: 95 mg/dL (ref 70–99)

## 2020-10-12 LAB — CBC
HCT: 39.2 % (ref 36.0–46.0)
Hemoglobin: 12.6 g/dL (ref 12.0–15.0)
MCH: 28.3 pg (ref 26.0–34.0)
MCHC: 32.1 g/dL (ref 30.0–36.0)
MCV: 88.1 fL (ref 80.0–100.0)
Platelets: 177 10*3/uL (ref 150–400)
RBC: 4.45 MIL/uL (ref 3.87–5.11)
RDW: 13.2 % (ref 11.5–15.5)
WBC: 8 10*3/uL (ref 4.0–10.5)
nRBC: 0 % (ref 0.0–0.2)

## 2020-10-12 LAB — BLOOD GAS, ARTERIAL
Acid-base deficit: 0.3 mmol/L (ref 0.0–2.0)
Bicarbonate: 23.2 mmol/L (ref 20.0–28.0)
FIO2: 21
O2 Saturation: 97.5 %
Patient temperature: 36.6
pCO2 arterial: 33.7 mmHg (ref 32.0–48.0)
pH, Arterial: 7.451 — ABNORMAL HIGH (ref 7.350–7.450)
pO2, Arterial: 97.2 mmHg (ref 83.0–108.0)

## 2020-10-12 LAB — URINALYSIS, ROUTINE W REFLEX MICROSCOPIC
Bacteria, UA: NONE SEEN
Bilirubin Urine: NEGATIVE
Glucose, UA: NEGATIVE mg/dL
Hgb urine dipstick: NEGATIVE
Ketones, ur: NEGATIVE mg/dL
Nitrite: NEGATIVE
Protein, ur: NEGATIVE mg/dL
Specific Gravity, Urine: 1.01 (ref 1.005–1.030)
pH: 5 (ref 5.0–8.0)

## 2020-10-12 LAB — TYPE AND SCREEN
ABO/RH(D): A POS
Antibody Screen: NEGATIVE

## 2020-10-12 LAB — APTT: aPTT: 66 seconds — ABNORMAL HIGH (ref 24–36)

## 2020-10-12 LAB — SURGICAL PCR SCREEN
MRSA, PCR: NEGATIVE
Staphylococcus aureus: NEGATIVE

## 2020-10-12 LAB — ABO/RH: ABO/RH(D): A POS

## 2020-10-12 LAB — HEPARIN LEVEL (UNFRACTIONATED): Heparin Unfractionated: 0.54 IU/mL (ref 0.30–0.70)

## 2020-10-12 NOTE — Progress Notes (Signed)
Incentive spirometer given to patient, instructed how to use and appropriately demonstrated

## 2020-10-12 NOTE — Progress Notes (Signed)
ANTICOAGULATION CONSULT NOTE  Pharmacy Consult for heparin gtt  Indication: chest pain/ACS   Labs: Recent Labs    10/10/20 0144 10/10/20 0445 10/10/20 1043 10/10/20 1210 10/11/20 0450 10/11/20 1131 10/11/20 1838 10/12/20 0324  HGB  --  13.9  --   --  12.3  --   --  12.6  HCT  --  42.6  --   --  39.3  --   --  39.2  PLT  --  215  --   --  179  --   --  177  LABPROT 13.0  --   --   --   --   --   --   --   INR 1.0  --   --   --   --   --   --   --   HEPARINUNFRC 0.28* 0.34  --   --  0.27* 0.27* 0.42 0.54  CREATININE 0.91  --  0.88 0.94  --   --   --  0.88    Assessment: Danielle Ellis a 65 y.o. female on heparin for the indication of chest pain/ACS.  S/p cath 2/25, found 3 vessel CAD. CT surgery consultation eval for CABG--planned for 3/1. Pharmacy consulted to resume heparin.   Heparin level therapeutic at 0.54 on gtt at 1250 uts/hr. No issues noted with infusion. No bleeding reported. H/H and plt wnl.   Goal of Therapy:  Heparin level 0.3-0.7 units/ml Monitor platelets by anticoagulation protocol: Yes   Plan:  Continue heparin drip at 1250 units/hr Daily heparin level, CBC while on heparin Monitor s/s bleeding   Pervis Hocking, PharmD PGY1 Pharmacy Resident 10/12/2020 7:32 AM  Please check [email or web address] for unit-specific pharmacy phone numbers.

## 2020-10-12 NOTE — Progress Notes (Signed)
      [address]       [place] [address]             [phone]      Patient with some chest pain overnight.  Relieved with NTG.  She is anxious and tearful this morning.  No new questions.  For CABG Tuesday with Dr. Anselm Lis, PA-C

## 2020-10-12 NOTE — Plan of Care (Signed)
  Problem: Clinical Measurements: Goal: Respiratory complications will improve Outcome: Progressing   Problem: Activity: Goal: Risk for activity intolerance will decrease Outcome: Progressing   Problem: Nutrition: Goal: Adequate nutrition will be maintained Outcome: Progressing   

## 2020-10-12 NOTE — Progress Notes (Addendum)
Patient and husband declined wanting to watch cardiac surgery videos however, I did give cardiac surgery book

## 2020-10-12 NOTE — Progress Notes (Signed)
Progress Note  Patient Name: Danielle Ellis Date of Encounter: 10/12/2020  [facility] Cardiologist: Peter Swazil[place], MD   Subjective   Mild "anxiety" in her chest last evening but no pain like her presenting symptoms; none this AM; no dyspnea  Inpatient Medications    Scheduled Meds: . aspirin EC  81 mg Oral Daily  . calcium carbonate  1 tablet Oral Q breakfast  . escitalopram  10 mg Oral Daily  . irbesartan  150 mg Oral Daily   And  . hydrochlorothiazide  25 mg Oral Daily  . insulin aspart  0-15 Units Subcutaneous TID WC  . insulin aspart  0-5 Units Subcutaneous QHS  . isosorbide mononitrate  30 mg Oral Daily  . latanoprost  1 drop Both Eyes QHS  . metoprolol tartrate  25 mg Oral BID  . multivitamin with minerals  1 tablet Oral Daily  . rosuvastatin  5 mg Oral QODAY  . sodium chloride flush  3 mL Intravenous Q12H  . valACYclovir  2,000 mg Oral BID   Continuous Infusions: . sodium chloride    . heparin 1,250 Units/hr (10/11/20 2209)   PRN Meds: sodium chloride, acetaminophen, ALPRAZolam, nitroGLYCERIN, ondansetron (ZOFRAN) IV, sodium chloride flush   Vital Signs    Vitals:   10/12/20 0404 10/12/20 0405 10/12/20 0636 10/12/20 0807  BP: (!) 146/77     Pulse: 71   81  Resp: 18   20  Temp:  98 F (36.7 C)  (!) 97.2 F (36.2 C)  TempSrc:  Oral  Oral  SpO2: 95%     Weight:   93.1 kg   Height:        Intake/Output Summary (Last 24 hours) at 10/12/2020 1012 Last data filed at 10/12/2020 0641 Gross per 24 hour  Intake 790.62 ml  Output 200 ml  Net 590.62 ml   Last 3 Weights 10/12/2020 10/11/2020 10/10/2020  Weight (lbs) 205 lb 4.8 oz 207 lb 3.2 oz 205 lb 1.6 oz  Weight (kg) 93.123 kg 93.985 kg 93.033 kg      Telemetry    Sinus - Personally Reviewed  Physical Exam   GEN: No acute distress.   Neck: No JVD Cardiac: RRR, no murmurs, rubs, or gallops.  Respiratory: Clear to auscultation bilaterally. GI: Soft, nontender, non-distended  MS: No edema;  radial cath site with no hematoma Neuro:  Nonfocal  Psych: Normal affect   Labs    High Sensitivity Troponin:   Recent Labs  Lab 10/09/20 1848 10/09/20 2103 10/09/20 2324 10/10/20 0144  TROPONINIHS 188* 156* 217* 195*      Chemistry Recent Labs  Lab 10/10/20 1043 10/10/20 1210 10/12/20 0324  NA 140 140 140  K 3.1* 3.6 3.8  CL 102 103 104  CO2 22 20* 26  GLUCOSE 107* 107* 105*  BUN CREATININE 0.88 0.94 0.88  CALCIUM 9.6 9.7 9.4  GFRNONAA >60 >60 >60  ANIONGAP 16* 17* 10     Hematology Recent Labs  Lab 10/10/20 0445 10/11/20 0450 10/12/20 0324  WBC 8.4 7.6 8.0  RBC 4.89 4.39 4.45  HGB 13.9 12.3 12.6  HCT 42.6 39.3 39.2  MCV 87.1 89.5 88.1  MCH 28.4 28.0 28.3  MCHC 32.6 31.3 32.1  RDW 13.2 13.4 13.2  PLT 215 179 177    Radiology    CARDIAC CATHETERIZATION  Result Date: 10/10/2020  Prox LAD to Mid LAD lesion is 60% stenosed.  Mid LAD lesion is 60% stenosed.  Prox Cx  to Mid Cx lesion is 85% stenosed.  1st Diag lesion is 90% stenosed.  Prox RCA lesion is 90% stenosed.  Mid RCA to Dist RCA lesion is 99% stenosed.  The left ventricular systolic function is normal.  LV end diastolic pressure is normal.  The left ventricular ejection fraction is 55-65% by visual estimate.  1. 3 vessel obstructive CAD   - long 60% areas of stenosis in the proximal and mid LAD with abnormal RFR of 0.85   - 90% ostial first diagonal   -85% proximal to mid LCx   - 90% proximal RCA. Long 99% mid RCA 2. Normal LV function 3. Normal LVEDP Plan: recommend CT surgery consultation for CABG   ECHOCARDIOGRAM COMPLETE  Result Date: 10/10/2020    ECHOCARDIOGRAM REPORT   Patient Name:   Danielle Ellis Date of Exam: 10/10/2020 Medical Rec #:  [id number]        Height:       61.0 in Accession #:    [id number]       Weight:       205.1 lb Date of Birth:  10-20-1955        BSA:          1.909 m Patient Age:    64 years         BP:           131/67 mmHg Patient Gender: F                 HR:           94 bpm. Exam Location:  Inpatient Procedure: 2D Echo, Cardiac Doppler and Color Doppler Indications:    Acute myocardial infarction  History:        Patient has no prior history of Echocardiogram examinations.                 CAD; Risk Factors:Diabetes, Hypertension and Dyslipidemia.  Sonographer:    Ross Ludwig RDCS (AE) Referring Phys: [id number] [facility]  Sonographer Comments: Suboptimal subcostal window. Image acquisition challenging due to patient body habitus. IMPRESSIONS  1. Left ventricular ejection fraction, by estimation, is 70 to 75%. The left ventricle has hyperdynamic function. The left ventricle has no regional wall motion abnormalities. Left ventricular diastolic parameters are consistent with Grade I diastolic dysfunction (impaired relaxation).  2. Right ventricular systolic function is normal. The right ventricular size is normal.  3. The mitral valve is normal in structure. No evidence of mitral valve regurgitation. No evidence of mitral stenosis.  4. The aortic valve is normal in structure. Aortic valve regurgitation is not visualized. Mild to moderate aortic valve sclerosis/calcification is present, without any evidence of aortic stenosis.  5. The inferior vena cava is normal in size with greater than 50% respiratory variability, suggesting right atrial pressure of 3 mmHg. FINDINGS  Left Ventricle: Left ventricular ejection fraction, by estimation, is 70 to 75%. The left ventricle has hyperdynamic function. The left ventricle has no regional wall motion abnormalities. Definity contrast agent was given IV to delineate the left ventricular endocardial borders. The left ventricular internal cavity size was normal in size. There is no left ventricular hypertrophy. Left ventricular diastolic parameters are consistent with Grade I diastolic dysfunction (impaired relaxation). Right Ventricle: The right ventricular size is normal. No increase in right ventricular wall thickness.  Right ventricular systolic function is normal. Left Atrium: Left atrial size was normal in size. Right Atrium: Right atrial size was normal in size. Pericardium: There is  no evidence of pericardial effusion. Mitral Valve: The mitral valve is normal in structure. Mild mitral annular calcification. No evidence of mitral valve regurgitation. No evidence of mitral valve stenosis. MV peak gradient, 5.6 mmHg. The mean mitral valve gradient is 1.0 mmHg. Tricuspid Valve: The tricuspid valve is normal in structure. Tricuspid valve regurgitation is not demonstrated. No evidence of tricuspid stenosis. Aortic Valve: The aortic valve is normal in structure. Aortic valve regurgitation is not visualized. Mild to moderate aortic valve sclerosis/calcification is present, without any evidence of aortic stenosis. Aortic valve mean gradient measures 5.0 mmHg. Aortic valve peak gradient measures 8.1 mmHg. Aortic valve area, by VTI measures 2.19 cm. Pulmonic Valve: The pulmonic valve was normal in structure. Pulmonic valve regurgitation is not visualized. No evidence of pulmonic stenosis. Aorta: The aortic root is normal in size and structure. Venous: The inferior vena cava is normal in size with greater than 50% respiratory variability, suggesting right atrial pressure of 3 mmHg. IAS/Shunts: No atrial level shunt detected by color flow Doppler.  LEFT VENTRICLE PLAX 2D LVIDd:         4.00 cm  Diastology LVIDs:         2.10 cm  LV e' medial:    4.24 cm/s LV PW:         1.40 cm  LV E/e' medial:  12.1 LV IVS:        1.10 cm  LV e' lateral:   8.49 cm/s LVOT diam:     2.00 cm  LV E/e' lateral: 6.1 LV SV:         53 LV SV Index:   28 LVOT Area:     3.14 cm  RIGHT VENTRICLE RV Basal diam:  2.80 cm RV S prime:     21.30 cm/s TAPSE (M-mode): 2.1 cm LEFT ATRIUM           Index       RIGHT ATRIUM           Index LA diam:      4.00 cm 2.10 cm/m  RA Area:     11.20 cm LA Vol (A2C): 27.4 ml 14.35 ml/m RA Volume:   25.00 ml  13.10 ml/m LA Vol  (A4C): 46.8 ml 24.51 ml/m  AORTIC VALVE AV Area (Vmax):    2.37 cm AV Area (Vmean):   2.32 cm AV Area (VTI):     2.19 cm AV Vmax:           142.00 cm/s AV Vmean:          102.000 cm/s AV VTI:            0.242 m AV Peak Grad:      8.1 mmHg AV Mean Grad:      5.0 mmHg LVOT Vmax:         107.00 cm/s LVOT Vmean:        75.400 cm/s LVOT VTI:          0.169 m LVOT/AV VTI ratio: 0.70  AORTA Ao Root diam: 3.00 cm Ao Asc diam:  3.10 cm MITRAL VALVE MV Area (PHT): 3.89 cm     SHUNTS MV Area VTI:   2.54 cm     Systemic VTI:  0.17 m MV Peak grad:  5.6 mmHg     Systemic Diam: 2.00 cm MV Mean grad:  1.0 mmHg MV Vmax:       1.18 m/s MV Vmean:      54.7 cm/s MV Decel Time: 195  msec MV E velocity: 51.40 cm/s MV A velocity: 105.00 cm/s MV E/A ratio:  0.49 Donato SchultzMark Skains MD Electronically signed by Donato SchultzMark Skains MD Signature Date/Time: 10/10/2020/2:17:26 PM    Final    VAS US DOPPLER PRE CABG  Result Date: 10/11/2020 PREOPERATIVE VASCULAR EVALUATION  Indications:      Pre-CABG. Risk Factors:     Hypertension, hyperlipidemia, Diabetes, coronary artery                   disease. Limitations:      Multiple IVs. Comparison Study: No prior studies. Performing Technologist: Jean RosenthalHodge, Rachel RDMS, RVT  Examination Guidelines: A complete evaluation includes B-mode imaging, spectral Doppler, color Doppler, and power Doppler as needed of all accessible portions of each vessel. Bilateral testing is considered an integral part of a complete examination. Limited examinations for reoccurring indications may be performed as noted.  Right Carotid Findings: +----------+--------+--------+--------+------------+--------+           PSV cm/sEDV cm/sStenosisDescribe    Comments +----------+--------+--------+--------+------------+--------+ CCA Prox  104     15                                   +----------+--------+--------+--------+------------+--------+ CCA Distal81      17                                    +----------+--------+--------+--------+------------+--------+ ICA Prox  68      27      1-39%   heterogenous         +----------+--------+--------+--------+------------+--------+ ICA Distal99      35                          tortuous +----------+--------+--------+--------+------------+--------+ ECA       122                                          +----------+--------+--------+--------+------------+--------+ Portions of this table do not appear on this page. +----------+--------+-------+----------------+------------+           PSV cm/sEDV cmsDescribe        Arm Pressure +----------+--------+-------+----------------+------------+ Subclavian118            Multiphasic, WNL             +----------+--------+-------+----------------+------------+ +---------+--------+--+--------+--+---------+ VertebralPSV cm/s50EDV cm/s12Antegrade +---------+--------+--+--------+--+---------+ Left Carotid Findings: +----------+--------+--------+--------+------------+--------+           PSV cm/sEDV cm/sStenosisDescribe    Comments +----------+--------+--------+--------+------------+--------+ CCA Prox  100     25                                   +----------+--------+--------+--------+------------+--------+ CCA Distal83      25                                   +----------+--------+--------+--------+------------+--------+ ICA Prox  71      19      1-39%   heterogenous         +----------+--------+--------+--------+------------+--------+ ICA Distal80      28  tortuous +----------+--------+--------+--------+------------+--------+ ECA       76                                           +----------+--------+--------+--------+------------+--------+ +----------+--------+--------+----------------+------------+ SubclavianPSV cm/sEDV cm/sDescribe        Arm Pressure +----------+--------+--------+----------------+------------+            136             Multiphasic, WNL             +----------+--------+--------+----------------+------------+ +---------+--------+--+--------+--+---------+ VertebralPSV cm/s58EDV cm/s22Antegrade +---------+--------+--+--------+--+---------+  ABI Findings: +---------+-----------------+-----+---------+---------------------------------+ Right    Rt Pressure      IndexWaveform Comment                                    (mmHg)                                                           +---------+-----------------+-----+---------+---------------------------------+ Brachial                       triphasicUnable to obtain pressure due to                                          IV                                +---------+-----------------+-----+---------+---------------------------------+ PTA      202              1.26 triphasic                                  +---------+-----------------+-----+---------+---------------------------------+ DP       201              1.26 triphasic                                  +---------+-----------------+-----+---------+---------------------------------+ Great Toe166              1.04 Normal                                     +---------+-----------------+-----+---------+---------------------------------+ +---------+------------------+-----+---------+-------+ Left     Lt Pressure (mmHg)IndexWaveform Comment +---------+------------------+-----+---------+-------+ Brachial 160                    triphasic        +---------+------------------+-----+---------+-------+ PTA      206               1.29 triphasic        +---------+------------------+-----+---------+-------+ DP       205               1.28 triphasic        +---------+------------------+-----+---------+-------+ Konrad Penta  1.21 Normal           +---------+------------------+-----+---------+-------+  +-------+---------------+----------------+ ABI/TBIToday's ABI/TBIPrevious ABI/TBI +-------+---------------+----------------+ Right  1.26/1.04                       +-------+---------------+----------------+ Left   1.29/1.21                       +-------+---------------+----------------+  Right Doppler Findings: +--------+--------+-----+---------+-----------------------------------+ Site    PressureIndexDoppler  Comments                            +--------+--------+-----+---------+-----------------------------------+ Brachial             triphasicUnable to obtain pressure due to IV +--------+--------+-----+---------+-----------------------------------+ Radial               triphasic                                    +--------+--------+-----+---------+-----------------------------------+ Ulnar                triphasic                                    +--------+--------+-----+---------+-----------------------------------+  Left Doppler Findings: +--------+--------+-----+---------+--------+ Site    PressureIndexDoppler  Comments +--------+--------+-----+---------+--------+ Brachial160          triphasic         +--------+--------+-----+---------+--------+ Radial               triphasic         +--------+--------+-----+---------+--------+ Ulnar                triphasic         +--------+--------+-----+---------+--------+  Summary: Right Carotid: Velocities in the right ICA are consistent with a 1-39% stenosis. Left Carotid: Velocities in the left ICA are consistent with a 1-39% stenosis. Vertebrals:  Bilateral vertebral arteries demonstrate antegrade flow. Subclavians: Normal flow hemodynamics were seen in bilateral subclavian              arteries. Right ABI: Resting right ankle-brachial index is within normal range. No evidence of significant right lower extremity arterial disease. The right toe-brachial index is normal. Left ABI: Resting left  ankle-brachial index is within normal range. No evidence of significant left lower extremity arterial disease. The left toe-brachial index is normal. Right Upper Extremity: Doppler waveforms remain within normal limits with right radial compression. Doppler waveforms remain within normal limits with right ulnar compression. Left Upper Extremity: Doppler waveforms remain within normal limits with left radial compression. Doppler waveforms remain within normal limits with left ulnar compression.  Electronically signed by Fabienne Bruns MD on 10/11/2020 at 10:41:05 AM.    Final     Patient Profile     65 y.o. female with past medical history of hypertension, hyperlipidemia, prediabetes admitted with non-ST elevation myocardial infarction.  Echocardiogram shows normal LV function, grade 1 diastolic dysfunction.  Cardiac catheterization reveals three-vessel coronary artery disease and normal LV function.  Assessment & Plan    1 coronary artery disease/non-ST elevation myocardial infarction-plan is for coronary artery bypass graft on Tuesday.  LV function is normal.  Continue medical therapy with aspirin, metoprolol, heparin and statin.  2 hyperlipidemia-patient intolerant to Lipitor previously.  Now on low-dose Crestor.  Will need close follow-up of lipids as  an outpatient with addition of Zetia or Repatha if LDL not at goal.  3 hypertension-blood pressure reasonable at present.  We will continue present medications and follow.  For questions or updates, please contact [facility] Please consult [email or web address] for contact info under        Signed, Olga Millers, MD  10/12/2020, 10:12 AM

## 2020-10-13 ENCOUNTER — Ambulatory Visit: Payer: Medicare Other | Admitting: Internal Medicine

## 2020-10-13 ENCOUNTER — Encounter (HOSPITAL_COMMUNITY): Payer: Self-pay | Admitting: Cardiology

## 2020-10-13 DIAGNOSIS — I214 Non-ST elevation (NSTEMI) myocardial infarction: Secondary | ICD-10-CM | POA: Diagnosis not present

## 2020-10-13 DIAGNOSIS — E119 Type 2 diabetes mellitus without complications: Secondary | ICD-10-CM | POA: Diagnosis not present

## 2020-10-13 LAB — CBC
HCT: 38.3 % (ref 36.0–46.0)
Hemoglobin: 12.9 g/dL (ref 12.0–15.0)
MCH: 29.3 pg (ref 26.0–34.0)
MCHC: 33.7 g/dL (ref 30.0–36.0)
MCV: 86.8 fL (ref 80.0–100.0)
Platelets: 177 10*3/uL (ref 150–400)
RBC: 4.41 MIL/uL (ref 3.87–5.11)
RDW: 13.1 % (ref 11.5–15.5)
WBC: 7.9 10*3/uL (ref 4.0–10.5)
nRBC: 0 % (ref 0.0–0.2)

## 2020-10-13 LAB — GLUCOSE, CAPILLARY
Glucose-Capillary: 111 mg/dL — ABNORMAL HIGH (ref 70–99)
Glucose-Capillary: 148 mg/dL — ABNORMAL HIGH (ref 70–99)
Glucose-Capillary: 86 mg/dL (ref 70–99)
Glucose-Capillary: 95 mg/dL (ref 70–99)

## 2020-10-13 LAB — BASIC METABOLIC PANEL
Anion gap: 11 (ref 5–15)
BUN: 15 mg/dL (ref 8–23)
CO2: 25 mmol/L (ref 22–32)
Calcium: 9.4 mg/dL (ref 8.9–10.3)
Chloride: 104 mmol/L (ref 98–111)
Creatinine, Ser: 0.9 mg/dL (ref 0.44–1.00)
GFR, Estimated: >60 mL/min (ref >60)
Glucose, Bld: 112 mg/dL — ABNORMAL HIGH (ref 70–99)
Potassium: 3.3 mmol/L — ABNORMAL LOW (ref 3.5–5.1)
Sodium: 140 mmol/L (ref 135–145)

## 2020-10-13 LAB — HEPARIN LEVEL (UNFRACTIONATED): Heparin Unfractionated: 0.54 IU/mL (ref 0.30–0.70)

## 2020-10-13 MED ORDER — NOREPINEPHRINE 4 MG/250ML-% IV SOLN
0.0000 ug/min | INTRAVENOUS | Status: DC
  Filled 2020-10-13: qty 250

## 2020-10-13 MED ORDER — TRANEXAMIC ACID 1000 MG/10ML IV SOLN
1.5000 mg/kg/h | INTRAVENOUS | Status: AC
  Administered 2020-10-14: 1.5 mg/kg/h via INTRAVENOUS
  Filled 2020-10-13: qty 25

## 2020-10-13 MED ORDER — SODIUM CHLORIDE 0.9 % IV SOLN
750.0000 mg | INTRAVENOUS | Status: AC
  Administered 2020-10-14: 750 mg via INTRAVENOUS
  Filled 2020-10-13: qty 750

## 2020-10-13 MED ORDER — VANCOMYCIN HCL 1500 MG/300ML IV SOLN
1500.0000 mg | INTRAVENOUS | Status: AC
  Administered 2020-10-14: 1500 mg via INTRAVENOUS
  Filled 2020-10-13: qty 300

## 2020-10-13 MED ORDER — SODIUM CHLORIDE 0.9 % IV SOLN
1.5000 g | INTRAVENOUS | Status: AC
  Administered 2020-10-14: 1.5 g via INTRAVENOUS
  Filled 2020-10-13: qty 1.5

## 2020-10-13 MED ORDER — MAGNESIUM SULFATE 50 % IJ SOLN
40.0000 meq | INTRAMUSCULAR | Status: DC
  Filled 2020-10-13: qty 9.85

## 2020-10-13 MED ORDER — ACETAMINOPHEN 500 MG PO TABS
1000.0000 mg | ORAL_TABLET | Freq: Once | ORAL | Status: AC
  Administered 2020-10-14: 1000 mg via ORAL
  Filled 2020-10-13: qty 2

## 2020-10-13 MED ORDER — METOPROLOL TARTRATE 12.5 MG HALF TABLET
12.5000 mg | ORAL_TABLET | Freq: Once | ORAL | Status: AC
  Administered 2020-10-14: 12.5 mg via ORAL
  Filled 2020-10-13: qty 1

## 2020-10-13 MED ORDER — EPINEPHRINE HCL 5 MG/250ML IV SOLN IN NS
0.0000 ug/min | INTRAVENOUS | Status: DC
  Filled 2020-10-13: qty 250

## 2020-10-13 MED ORDER — TRANEXAMIC ACID (OHS) BOLUS VIA INFUSION
15.0000 mg/kg | INTRAVENOUS | Status: AC
  Administered 2020-10-14: 1396.5 mg via INTRAVENOUS
  Filled 2020-10-13: qty 1397

## 2020-10-13 MED ORDER — CHLORHEXIDINE GLUCONATE CLOTH 2 % EX PADS
6.0000 | MEDICATED_PAD | Freq: Once | CUTANEOUS | Status: AC
  Administered 2020-10-14: 6 via TOPICAL

## 2020-10-13 MED ORDER — BISACODYL 5 MG PO TBEC: 5.0000 mg | DELAYED_RELEASE_TABLET | Freq: Once | ORAL | Status: DC

## 2020-10-13 MED ORDER — NITROGLYCERIN IN D5W 200-5 MCG/ML-% IV SOLN
2.0000 ug/min | INTRAVENOUS | Status: DC
  Filled 2020-10-13: qty 250

## 2020-10-13 MED ORDER — PHENYLEPHRINE HCL-NACL 20-0.9 MG/250ML-% IV SOLN
30.0000 ug/min | INTRAVENOUS | Status: DC
  Filled 2020-10-13: qty 250

## 2020-10-13 MED ORDER — DEXMEDETOMIDINE HCL IN NACL 400 MCG/100ML IV SOLN
0.1000 ug/kg/h | INTRAVENOUS | Status: AC
  Administered 2020-10-14: .4 ug/kg/h via INTRAVENOUS
  Filled 2020-10-13: qty 100

## 2020-10-13 MED ORDER — TRANEXAMIC ACID (OHS) PUMP PRIME SOLUTION
2.0000 mg/kg | INTRAVENOUS | Status: DC
  Filled 2020-10-13: qty 1.86

## 2020-10-13 MED ORDER — TEMAZEPAM 15 MG PO CAPS
15.0000 mg | ORAL_CAPSULE | Freq: Once | ORAL | Status: AC | PRN
  Administered 2020-10-13: 15 mg via ORAL
  Filled 2020-10-13: qty 1

## 2020-10-13 MED ORDER — INSULIN REGULAR(HUMAN) IN NACL 100-0.9 UT/100ML-% IV SOLN
INTRAVENOUS | Status: AC
  Administered 2020-10-14: 2.2 [IU]/h via INTRAVENOUS
  Filled 2020-10-13: qty 100

## 2020-10-13 MED ORDER — POTASSIUM CHLORIDE 2 MEQ/ML IV SOLN
80.0000 meq | INTRAVENOUS | Status: DC
  Filled 2020-10-13: qty 40

## 2020-10-13 MED ORDER — CHLORHEXIDINE GLUCONATE CLOTH 2 % EX PADS
6.0000 | MEDICATED_PAD | Freq: Once | CUTANEOUS | Status: AC
  Administered 2020-10-13: 6 via TOPICAL

## 2020-10-13 MED ORDER — DIAZEPAM 5 MG PO TABS
5.0000 mg | ORAL_TABLET | Freq: Once | ORAL | Status: AC
  Administered 2020-10-14: 5 mg via ORAL
  Filled 2020-10-13: qty 1

## 2020-10-13 MED ORDER — POTASSIUM CHLORIDE CRYS ER 20 MEQ PO TBCR
40.0000 meq | EXTENDED_RELEASE_TABLET | Freq: Once | ORAL | Status: AC
  Administered 2020-10-13: 40 meq via ORAL
  Filled 2020-10-13: qty 2

## 2020-10-13 MED ORDER — CHLORHEXIDINE GLUCONATE 0.12 % MT SOLN
15.0000 mL | Freq: Once | OROMUCOSAL | Status: AC
  Administered 2020-10-14: 15 mL via OROMUCOSAL
  Filled 2020-10-13: qty 15

## 2020-10-13 MED ORDER — SODIUM CHLORIDE 0.9 % IV SOLN
INTRAVENOUS | Status: DC
  Filled 2020-10-13: qty 30

## 2020-10-13 MED ORDER — MILRINONE LACTATE IN DEXTROSE 20-5 MG/100ML-% IV SOLN
0.3000 ug/kg/min | INTRAVENOUS | Status: DC
  Filled 2020-10-13: qty 100

## 2020-10-13 MED ORDER — PLASMA-LYTE 148 IV SOLN
INTRAVENOUS | Status: DC
  Filled 2020-10-13: qty 2.5

## 2020-10-13 NOTE — Progress Notes (Addendum)
The patient has been seen in conjunction with Laverda PageLindsay Roberts, NP. All aspects of care have been considered and discussed. The patient has been personally interviewed, examined, and all clinical data has been reviewed.   No chest pain.  Exam is unremarkable.  Labs are stable except potassium 3.3 which will be repleted.  Awaiting bypass surgery tomorrow by Dr. Laneta SimmersBartle.   Progress Note  Patient Name: Danielle Ellis Date of Encounter: 10/13/2020  [facility] Cardiologist: Peter SwazilandJordan, MD   Subjective   No chest pain overnight. Anxious about surgery but ready.   Inpatient Medications    Scheduled Meds: . aspirin EC  81 mg Oral Daily  . calcium carbonate  1 tablet Oral Q breakfast  . escitalopram  10 mg Oral Daily  . irbesartan  150 mg Oral Daily   And  . hydrochlorothiazide  25 mg Oral Daily  . insulin aspart  0-15 Units Subcutaneous TID WC  . insulin aspart  0-5 Units Subcutaneous QHS  . isosorbide mononitrate  30 mg Oral Daily  . latanoprost  1 drop Both Eyes QHS  . metoprolol tartrate  25 mg Oral BID  . multivitamin with minerals  1 tablet Oral Daily  . rosuvastatin  5 mg Oral QODAY  . sodium chloride flush  3 mL Intravenous Q12H  . valACYclovir  2,000 mg Oral BID   Continuous Infusions: . sodium chloride    . heparin 1,250 Units/hr (10/12/20 1831)   PRN Meds: sodium chloride, acetaminophen, ALPRAZolam, nitroGLYCERIN, ondansetron (ZOFRAN) IV, sodium chloride flush   Vital Signs    Vitals:   10/12/20 1109 10/12/20 1443 10/12/20 2239 10/12/20 2300  BP:   (!) 145/80 (!) 145/80  Pulse:  77 73 74  Resp: 18 20 18    Temp: 97.8 F (36.6 C) 98.5 F (36.9 C) 98.3 F (36.8 C)   TempSrc: Oral Oral Oral   SpO2:   99%   Weight:      Height:        Intake/Output Summary (Last 24 hours) at 10/13/2020 0738 Last data filed at 10/12/2020 2359 Gross per 24 hour  Intake 576.13 ml  Output 1000 ml  Net -423.87 ml   Last 3 Weights 10/12/2020 10/11/2020 10/10/2020   Weight (lbs) 205 lb 4.8 oz 207 lb 3.2 oz 205 lb 1.6 oz  Weight (kg) 93.123 kg 93.985 kg 93.033 kg      Telemetry    SR - Personally Reviewed  ECG    No new tracing this morning  Physical Exam   GEN: No acute distress.   Neck: No JVD Cardiac: RRR, no murmurs, rubs, or gallops.  Respiratory: Clear to auscultation bilaterally. GI: Soft, nontender, non-distended  MS: No edema; No deformity. Right radial cath site stable.  Neuro:  Nonfocal  Psych: Normal affect   Labs    High Sensitivity Troponin:   Recent Labs  Lab 10/09/20 1848 10/09/20 2103 10/09/20 2324 10/10/20 0144  TROPONINIHS 188* 156* 217* 195*      Chemistry Recent Labs  Lab 10/10/20 1210 10/12/20 0324 10/13/20 0143  NA 140 140 140  K 3.6 3.8 3.3*  CL 103 104 104  CO2 20* 26 25  GLUCOSE 107* 105* 112*  BUN 16 12 15   CREATININE 0.94 0.88 0.90  CALCIUM 9.7 9.4 9.4  GFRNONAA >60 >60 >60  ANIONGAP 17* 10 11     Hematology Recent Labs  Lab 10/11/20 0450 10/12/20 0324 10/13/20 0143  WBC 7.6 8.0 7.9  RBC 4.39 4.45  4.41  HGB 12.3 12.6 12.9  HCT 39.3 39.2 38.3  MCV 89.5 88.1 86.8  MCH 28.0 28.3 29.3  MCHC 31.3 32.1 33.7  RDW 13.4 13.2 13.1  PLT 179 177 177    BNPNo results for input(s): BNP, PROBNP in the last 168 hours.   DDimer No results for input(s): DDIMER in the last 168 hours.   Radiology    DG Chest 2 View  Result Date: 10/12/2020 CLINICAL DATA:  Preoperative cardiovascular exam. EXAM: CHEST - 2 VIEW COMPARISON:  Chest radiograph October 09, 2020 FINDINGS: The heart size and mediastinal contours are within normal limits. Both lungs are clear. The visualized skeletal structures are unremarkable. IMPRESSION: No active cardiopulmonary disease. Electronically Signed   By: Maudry Mayhew MD   On: 10/12/2020 16:06   VAS US DOPPLER PRE CABG  Result Date: 10/11/2020 PREOPERATIVE VASCULAR EVALUATION  Indications:      Pre-CABG. Risk Factors:     Hypertension, hyperlipidemia, Diabetes,  coronary artery                   disease. Limitations:      Multiple IVs. Comparison Study: No prior studies. Performing Technologist: Jean Rosenthal RDMS, RVT  Examination Guidelines: A complete evaluation includes B-mode imaging, spectral Doppler, color Doppler, and power Doppler as needed of all accessible portions of each vessel. Bilateral testing is considered an integral part of a complete examination. Limited examinations for reoccurring indications may be performed as noted.  Right Carotid Findings: +----------+--------+--------+--------+------------+--------+           PSV cm/sEDV cm/sStenosisDescribe    Comments +----------+--------+--------+--------+------------+--------+ CCA Prox  104     15                                   +----------+--------+--------+--------+------------+--------+ CCA Distal81      17                                   +----------+--------+--------+--------+------------+--------+ ICA Prox  68      27      1-39%   heterogenous         +----------+--------+--------+--------+------------+--------+ ICA Distal99      35                          tortuous +----------+--------+--------+--------+------------+--------+ ECA       122                                          +----------+--------+--------+--------+------------+--------+ Portions of this table do not appear on this page. +----------+--------+-------+----------------+------------+           PSV cm/sEDV cmsDescribe        Arm Pressure +----------+--------+-------+----------------+------------+ Subclavian118            Multiphasic, WNL             +----------+--------+-------+----------------+------------+ +---------+--------+--+--------+--+---------+ VertebralPSV cm/s50EDV cm/s12Antegrade +---------+--------+--+--------+--+---------+ Left Carotid Findings: +----------+--------+--------+--------+------------+--------+           PSV cm/sEDV cm/sStenosisDescribe     Comments +----------+--------+--------+--------+------------+--------+ CCA Prox  100     25                                   +----------+--------+--------+--------+------------+--------+  CCA Distal83      25                                   +----------+--------+--------+--------+------------+--------+ ICA Prox  71      19      1-39%   heterogenous         +----------+--------+--------+--------+------------+--------+ ICA Distal80      28                          tortuous +----------+--------+--------+--------+------------+--------+ ECA       76                                           +----------+--------+--------+--------+------------+--------+ +----------+--------+--------+----------------+------------+ SubclavianPSV cm/sEDV cm/sDescribe        Arm Pressure +----------+--------+--------+----------------+------------+           136             Multiphasic, WNL             +----------+--------+--------+----------------+------------+ +---------+--------+--+--------+--+---------+ VertebralPSV cm/s58EDV cm/s22Antegrade +---------+--------+--+--------+--+---------+  ABI Findings: +---------+-----------------+-----+---------+---------------------------------+ Right    Rt Pressure      IndexWaveform Comment                                    (mmHg)                                                           +---------+-----------------+-----+---------+---------------------------------+ Brachial                       triphasicUnable to obtain pressure due to                                          IV                                +---------+-----------------+-----+---------+---------------------------------+ PTA      202              1.26 triphasic                                  +---------+-----------------+-----+---------+---------------------------------+ DP       201              1.26 triphasic                                   +---------+-----------------+-----+---------+---------------------------------+ Great Toe166              1.04 Normal                                     +---------+-----------------+-----+---------+---------------------------------+ +---------+------------------+-----+---------+-------+ Left  Lt Pressure (mmHg)IndexWaveform Comment +---------+------------------+-----+---------+-------+ Brachial 160                    triphasic        +---------+------------------+-----+---------+-------+ PTA      206               1.29 triphasic        +---------+------------------+-----+---------+-------+ DP       205               1.28 triphasic        +---------+------------------+-----+---------+-------+ Great Toe193               1.21 Normal           +---------+------------------+-----+---------+-------+ +-------+---------------+----------------+ ABI/TBIToday's ABI/TBIPrevious ABI/TBI +-------+---------------+----------------+ Right  1.26/1.04                       +-------+---------------+----------------+ Left   1.29/1.21                       +-------+---------------+----------------+  Right Doppler Findings: +--------+--------+-----+---------+-----------------------------------+ Site    PressureIndexDoppler  Comments                            +--------+--------+-----+---------+-----------------------------------+ Brachial             triphasicUnable to obtain pressure due to IV +--------+--------+-----+---------+-----------------------------------+ Radial               triphasic                                    +--------+--------+-----+---------+-----------------------------------+ Ulnar                triphasic                                    +--------+--------+-----+---------+-----------------------------------+  Left Doppler Findings: +--------+--------+-----+---------+--------+ Site    PressureIndexDoppler  Comments  +--------+--------+-----+---------+--------+ Brachial160          triphasic         +--------+--------+-----+---------+--------+ Radial               triphasic         +--------+--------+-----+---------+--------+ Ulnar                triphasic         +--------+--------+-----+---------+--------+  Summary: Right Carotid: Velocities in the right ICA are consistent with a 1-39% stenosis. Left Carotid: Velocities in the left ICA are consistent with a 1-39% stenosis. Vertebrals:  Bilateral vertebral arteries demonstrate antegrade flow. Subclavians: Normal flow hemodynamics were seen in bilateral subclavian              arteries. Right ABI: Resting right ankle-brachial index is within normal range. No evidence of significant right lower extremity arterial disease. The right toe-brachial index is normal. Left ABI: Resting left ankle-brachial index is within normal range. No evidence of significant left lower extremity arterial disease. The left toe-brachial index is normal. Right Upper Extremity: Doppler waveforms remain within normal limits with right radial compression. Doppler waveforms remain within normal limits with right ulnar compression. Left Upper Extremity: Doppler waveforms remain within normal limits with left radial compression. Doppler waveforms remain within normal limits with left ulnar compression.  Electronically signed by Fabienne Bruns MD on 10/11/2020  at 10:41:05 AM.    Final     Cardiac Studies   Cath: 10/10/20   Prox LAD to Mid LAD lesion is 60% stenosed.  Mid LAD lesion is 60% stenosed.  Prox Cx to Mid Cx lesion is 85% stenosed.  1st Diag lesion is 90% stenosed.  Prox RCA lesion is 90% stenosed.  Mid RCA to Dist RCA lesion is 99% stenosed.  The left ventricular systolic function is normal.  LV end diastolic pressure is normal.  The left ventricular ejection fraction is 55-65% by visual estimate.   1. 3 vessel obstructive CAD   - long 60% areas of stenosis  in the proximal and mid LAD with abnormal RFR of 0.85   - 90% ostial first diagonal   -85% proximal to mid LCx   - 90% proximal RCA. Long 99% mid RCA 2. Normal LV function 3. Normal LVEDP  Plan: recommend CT surgery consultation for CABG  Diagnostic Dominance: Right    Echo: 10/10/20  IMPRESSIONS    1. Left ventricular ejection fraction, by estimation, is 70 to 75%. The  left ventricle has hyperdynamic function. The left ventricle has no  regional wall motion abnormalities. Left ventricular diastolic parameters  are consistent with Grade I diastolic  dysfunction (impaired relaxation).  2. Right ventricular systolic function is normal. The right ventricular  size is normal.  3. The mitral valve is normal in structure. No evidence of mitral valve  regurgitation. No evidence of mitral stenosis.  4. The aortic valve is normal in structure. Aortic valve regurgitation is  not visualized. Mild to moderate aortic valve sclerosis/calcification is  present, without any evidence of aortic stenosis.  5. The inferior vena cava is normal in size with greater than 50%  respiratory variability, suggesting right atrial pressure of 3 mmHg.   Patient Profile     65 y.o. female with past medical history of hypertension, hyperlipidemia, prediabetes admitted with non-ST elevation myocardial infarction.  Echocardiogram shows normal LV function, grade 1 diastolic dysfunction.  Cardiac catheterization reveals three-vessel coronary artery disease and normal LV function.  Assessment & Plan    1. NSTEMI: hsTn peaked at 217. Underwent cardiac cath noted above with severe multivessel disease with TCTS consulted. Recommendations for CABG to be done on Tuesday with Dr. Laneta Simmers.  -- remains on IV heparin, ASA, crestor 5mg  every other day, metoprolol 25mg  BID, and ARB/HCTZ combo  2. HTN: stable with current therapy  3. HLD: hx of intolerance to atorva, recently started on crestor 5mg  every other day  which was started just last Friday. Talked with her about the possibility of PCSK9s as an outpatient.  -- LDL 182, HDL 33  4. Pre-DM: Hgb A1c 6.2 -- she is motivated to work on diet and exercise going forward   For questions or updates, please contact [facility] Please consult [email or web address] for contact info under        Signed, , NP  10/13/2020, 7:38 AM

## 2020-10-13 NOTE — Progress Notes (Signed)
CARDIAC REHAB PHASE I   PRE:  Rate/Rhythm: 79 SR  BP:  Sitting: 132/75     SaO2: 94 RA  MODE:  Ambulation: 430 ft   POST:  Rate/Rhythm: 90 SR   BP:  Sitting:      SaO2: 98 RA  Pt tolerated exercise well with standby assist and no assistive device and had no CP, SOB or pain. Information given to patient, husband and daughter in room on Pre CABG OHS book, mobility post op, IS usage is currently 47 pre op, education given on sternal precautions. Pt will have family to stay with them upon discharge. Pt and family verbalized understanding and pt was left in the recliner with call bell in reach.   [id number] Danielle Ellis ACSM-EP 10/13/2020 1:18 PM

## 2020-10-13 NOTE — Anesthesia Preprocedure Evaluation (Addendum)
Anesthesia Evaluation  Patient identified by MRN, date of birth, ID band Patient awake    Reviewed: Allergy & Precautions, NPO status , Patient's Chart, lab work & pertinent test results  Airway Mallampati: II  TM Distance: >3 FB Neck ROM: Full    Dental  (+) Edentulous Upper, Edentulous Lower   Pulmonary sleep apnea ,    Pulmonary exam normal        Cardiovascular hypertension, Pt. on medications + CAD and + Past MI  Normal cardiovascular exam  3v CAD. Normal LVF   Neuro/Psych PSYCHIATRIC DISORDERS Anxiety Depression negative neurological ROS     GI/Hepatic negative GI ROS, Neg liver ROS,   Endo/Other  diabetes, Type 2, Oral Hypoglycemic Agents Obesity   Renal/GU negative Renal ROS     Musculoskeletal   Abdominal   Peds  Hematology negative hematology ROS (+)   Anesthesia Other Findings   Reproductive/Obstetrics                            Lab Results  Component Value Date   WBC 7.9 10/13/2020   HGB 12.9 10/13/2020   HCT 38.3 10/13/2020   MCV 86.8 10/13/2020   PLT 177 10/13/2020   Lab Results  Component Value Date   CREATININE 0.90 10/13/2020   BUN 15 10/13/2020   NA 140 10/13/2020   K 3.3 (L) 10/13/2020   CL 104 10/13/2020   CO2 25 10/13/2020    Anesthesia Physical Anesthesia Plan  ASA: IV  Anesthesia Plan: General   Post-op Pain Management:    Induction: Intravenous  PONV Risk Score and Plan: 3 and Treatment may vary due to age or medical condition  Airway Management Planned: Oral ETT  Additional Equipment: Arterial line, CVP, PA Cath, TEE and Ultrasound Guidance Line Placement  Intra-op Plan:   Post-operative Plan: Post-operative intubation/ventilation  Informed Consent: I have reviewed the patients History and Physical, chart, labs and discussed the procedure including the risks, benefits and alternatives for the proposed anesthesia with the patient or  authorized representative who has indicated his/her understanding and acceptance.     Dental advisory given  Plan Discussed with: CRNA and Anesthesiologist  Anesthesia Plan Comments:        Anesthesia Quick Evaluation

## 2020-10-13 NOTE — Plan of Care (Signed)
  Problem: Health Behavior/Discharge Planning: Goal: Ability to manage health-related needs will improve Outcome: Progressing   Problem: Clinical Measurements: Goal: Diagnostic test results will improve Outcome: Progressing Goal: Cardiovascular complication will be avoided Outcome: Progressing   Problem: Coping: Goal: Level of anxiety will decrease Outcome: Progressing   Problem: Elimination: Goal: Will not experience complications related to bowel motility Outcome: Progressing Goal: Will not experience complications related to urinary retention Outcome: Progressing

## 2020-10-13 NOTE — Progress Notes (Addendum)
      [address]       [place] [address]             [phone]      Says she has been feeling very anxious about planned CABG but had a Xanax this morning and is feeling much better. Denies any further chest pain. Heparin is infusing.  Her questions about post-op sedation and extubation protocols were answered. She had no further questions. K+ 3.3 this am and is being supplemented. All other labs OK.  Plan for OR in AM.  Gaynelle Arabian, PA-C [phone]   Chart reviewed, patient examined, agree with above. No chest pain today. I discussed surgery again with patient and daughter, husband. They have no further questions. Plan to proceed with CABG in am.

## 2020-10-14 ENCOUNTER — Inpatient Hospital Stay (HOSPITAL_COMMUNITY)
Admission: EM | Disposition: A | Discharge status: Home / Self Care | Payer: Self-pay | Source: Home / Self Care | Attending: Surgery

## 2020-10-14 ENCOUNTER — Inpatient Hospital Stay (HOSPITAL_COMMUNITY): Payer: Medicare Other

## 2020-10-14 ENCOUNTER — Inpatient Hospital Stay (HOSPITAL_COMMUNITY): Payer: Medicare Other | Admitting: Certified Registered Nurse Anesthetist

## 2020-10-14 DIAGNOSIS — Z951 Presence of aortocoronary bypass graft: Secondary | ICD-10-CM

## 2020-10-14 DIAGNOSIS — I2510 Atherosclerotic heart disease of native coronary artery without angina pectoris: Secondary | ICD-10-CM | POA: Diagnosis present

## 2020-10-14 HISTORY — PX: CORONARY ARTERY BYPASS GRAFT: SHX141

## 2020-10-14 HISTORY — PX: TEE WITHOUT CARDIOVERSION: SHX5443

## 2020-10-14 LAB — POCT I-STAT, CHEM 8
BUN: 17 mg/dL (ref 8–23)
BUN: 15 mg/dL (ref 8–23)
BUN: 13 mg/dL (ref 8–23)
BUN: 13 mg/dL (ref 8–23)
Calcium, Ion: 1.26 mmol/L (ref 1.15–1.40)
Calcium, Ion: 1.24 mmol/L (ref 1.15–1.40)
Calcium, Ion: 1.04 mmol/L — ABNORMAL LOW (ref 1.15–1.40)
Calcium, Ion: 1.06 mmol/L — ABNORMAL LOW (ref 1.15–1.40)
Chloride: 105 mmol/L (ref 98–111)
Chloride: 106 mmol/L (ref 98–111)
Chloride: 104 mmol/L (ref 98–111)
Chloride: 106 mmol/L (ref 98–111)
Creatinine, Ser: 0.9 mg/dL (ref 0.44–1.00)
Creatinine, Ser: 0.8 mg/dL (ref 0.44–1.00)
Creatinine, Ser: 0.6 mg/dL (ref 0.44–1.00)
Creatinine, Ser: 0.6 mg/dL (ref 0.44–1.00)
Glucose, Bld: 121 mg/dL — ABNORMAL HIGH (ref 70–99)
Glucose, Bld: 101 mg/dL — ABNORMAL HIGH (ref 70–99)
Glucose, Bld: 101 mg/dL — ABNORMAL HIGH (ref 70–99)
Glucose, Bld: 139 mg/dL — ABNORMAL HIGH (ref 70–99)
HCT: 36 % (ref 36.0–46.0)
HCT: 30 % — ABNORMAL LOW (ref 36.0–46.0)
HCT: 25 % — ABNORMAL LOW (ref 36.0–46.0)
HCT: 26 % — ABNORMAL LOW (ref 36.0–46.0)
Hemoglobin: 12.2 g/dL (ref 12.0–15.0)
Hemoglobin: 10.2 g/dL — ABNORMAL LOW (ref 12.0–15.0)
Hemoglobin: 8.5 g/dL — ABNORMAL LOW (ref 12.0–15.0)
Hemoglobin: 8.8 g/dL — ABNORMAL LOW (ref 12.0–15.0)
Potassium: 4.1 mmol/L (ref 3.5–5.1)
Potassium: 3.9 mmol/L (ref 3.5–5.1)
Potassium: 4.6 mmol/L (ref 3.5–5.1)
Potassium: 4.6 mmol/L (ref 3.5–5.1)
Sodium: 141 mmol/L (ref 135–145)
Sodium: 140 mmol/L (ref 135–145)
Sodium: 141 mmol/L (ref 135–145)
Sodium: 140 mmol/L (ref 135–145)
TCO2: 23 mmol/L (ref 22–32)
TCO2: 25 mmol/L (ref 22–32)
TCO2: 25 mmol/L (ref 22–32)
TCO2: 24 mmol/L (ref 22–32)

## 2020-10-14 LAB — POCT I-STAT 7, (LYTES, BLD GAS, ICA,H+H)
Acid-Base Excess: 0 mmol/L (ref 0.0–2.0)
Acid-Base Excess: 0 mmol/L (ref 0.0–2.0)
Acid-base deficit: 3 mmol/L — ABNORMAL HIGH (ref 0.0–2.0)
Acid-base deficit: 4 mmol/L — ABNORMAL HIGH (ref 0.0–2.0)
Acid-base deficit: 4 mmol/L — ABNORMAL HIGH (ref 0.0–2.0)
Bicarbonate: 23.8 mmol/L (ref 20.0–28.0)
Bicarbonate: 24.4 mmol/L (ref 20.0–28.0)
Bicarbonate: 22.1 mmol/L (ref 20.0–28.0)
Bicarbonate: 21 mmol/L (ref 20.0–28.0)
Bicarbonate: 21.3 mmol/L (ref 20.0–28.0)
Calcium, Ion: 1.02 mmol/L — ABNORMAL LOW (ref 1.15–1.40)
Calcium, Ion: 1.01 mmol/L — ABNORMAL LOW (ref 1.15–1.40)
Calcium, Ion: 1.08 mmol/L — ABNORMAL LOW (ref 1.15–1.40)
Calcium, Ion: 1.21 mmol/L (ref 1.15–1.40)
Calcium, Ion: 1.2 mmol/L (ref 1.15–1.40)
HCT: 25 % — ABNORMAL LOW (ref 36.0–46.0)
HCT: 23 % — ABNORMAL LOW (ref 36.0–46.0)
HCT: 29 % — ABNORMAL LOW (ref 36.0–46.0)
HCT: 30 % — ABNORMAL LOW (ref 36.0–46.0)
HCT: 30 % — ABNORMAL LOW (ref 36.0–46.0)
Hemoglobin: 8.5 g/dL — ABNORMAL LOW (ref 12.0–15.0)
Hemoglobin: 7.8 g/dL — ABNORMAL LOW (ref 12.0–15.0)
Hemoglobin: 9.9 g/dL — ABNORMAL LOW (ref 12.0–15.0)
Hemoglobin: 10.2 g/dL — ABNORMAL LOW (ref 12.0–15.0)
Hemoglobin: 10.2 g/dL — ABNORMAL LOW (ref 12.0–15.0)
O2 Saturation: 100 %
O2 Saturation: 100 %
O2 Saturation: 97 %
O2 Saturation: 98 %
O2 Saturation: 98 %
Patient temperature: 36.2
Patient temperature: 36.6
Patient temperature: 36.9
Potassium: 5.7 mmol/L — ABNORMAL HIGH (ref 3.5–5.1)
Potassium: 5.2 mmol/L — ABNORMAL HIGH (ref 3.5–5.1)
Potassium: 3.9 mmol/L (ref 3.5–5.1)
Potassium: 4.9 mmol/L (ref 3.5–5.1)
Potassium: 5 mmol/L (ref 3.5–5.1)
Sodium: 142 mmol/L (ref 135–145)
Sodium: 139 mmol/L (ref 135–145)
Sodium: 142 mmol/L (ref 135–145)
Sodium: 140 mmol/L (ref 135–145)
Sodium: 140 mmol/L (ref 135–145)
TCO2: 25 mmol/L (ref 22–32)
TCO2: 25 mmol/L (ref 22–32)
TCO2: 23 mmol/L (ref 22–32)
TCO2: 22 mmol/L (ref 22–32)
TCO2: 23 mmol/L (ref 22–32)
pCO2 arterial: 36.2 mmHg (ref 32.0–48.0)
pCO2 arterial: 37.9 mmHg (ref 32.0–48.0)
pCO2 arterial: 38.9 mmHg (ref 32.0–48.0)
pCO2 arterial: 36.2 mmHg (ref 32.0–48.0)
pCO2 arterial: 38.4 mmHg (ref 32.0–48.0)
pH, Arterial: 7.426 (ref 7.350–7.450)
pH, Arterial: 7.415 (ref 7.350–7.450)
pH, Arterial: 7.36 (ref 7.350–7.450)
pH, Arterial: 7.369 (ref 7.350–7.450)
pH, Arterial: 7.352 (ref 7.350–7.450)
pO2, Arterial: 385 mmHg — ABNORMAL HIGH (ref 83.0–108.0)
pO2, Arterial: 281 mmHg — ABNORMAL HIGH (ref 83.0–108.0)
pO2, Arterial: 94 mmHg (ref 83.0–108.0)
pO2, Arterial: 103 mmHg (ref 83.0–108.0)
pO2, Arterial: 115 mmHg — ABNORMAL HIGH (ref 83.0–108.0)

## 2020-10-14 LAB — POCT I-STAT EG7
Acid-base deficit: 2 mmol/L (ref 0.0–2.0)
Bicarbonate: 22.4 mmol/L (ref 20.0–28.0)
Calcium, Ion: 1.01 mmol/L — ABNORMAL LOW (ref 1.15–1.40)
HCT: 24 % — ABNORMAL LOW (ref 36.0–46.0)
Hemoglobin: 8.2 g/dL — ABNORMAL LOW (ref 12.0–15.0)
O2 Saturation: 83 %
Potassium: 4.6 mmol/L (ref 3.5–5.1)
Sodium: 142 mmol/L (ref 135–145)
TCO2: 23 mmol/L (ref 22–32)
pCO2, Ven: 34.8 mmHg — ABNORMAL LOW (ref 44.0–60.0)
pH, Ven: 7.418 (ref 7.250–7.430)
pO2, Ven: 46 mmHg — ABNORMAL HIGH (ref 32.0–45.0)

## 2020-10-14 LAB — CBC
HCT: 37.5 % (ref 36.0–46.0)
HCT: 28.5 % — ABNORMAL LOW (ref 36.0–46.0)
HCT: 30.9 % — ABNORMAL LOW (ref 36.0–46.0)
Hemoglobin: 12.6 g/dL (ref 12.0–15.0)
Hemoglobin: 9.9 g/dL — ABNORMAL LOW (ref 12.0–15.0)
Hemoglobin: 10 g/dL — ABNORMAL LOW (ref 12.0–15.0)
MCH: 29.2 pg (ref 26.0–34.0)
MCH: 30.7 pg (ref 26.0–34.0)
MCH: 29 pg (ref 26.0–34.0)
MCHC: 33.6 g/dL (ref 30.0–36.0)
MCHC: 34.7 g/dL (ref 30.0–36.0)
MCHC: 32.4 g/dL (ref 30.0–36.0)
MCV: 87 fL (ref 80.0–100.0)
MCV: 88.5 fL (ref 80.0–100.0)
MCV: 89.6 fL (ref 80.0–100.0)
Platelets: 172 10*3/uL (ref 150–400)
Platelets: 123 10*3/uL — ABNORMAL LOW (ref 150–400)
Platelets: 134 10*3/uL — ABNORMAL LOW (ref 150–400)
RBC: 4.31 MIL/uL (ref 3.87–5.11)
RBC: 3.22 MIL/uL — ABNORMAL LOW (ref 3.87–5.11)
RBC: 3.45 MIL/uL — ABNORMAL LOW (ref 3.87–5.11)
RDW: 13.2 % (ref 11.5–15.5)
RDW: 13.1 % (ref 11.5–15.5)
RDW: 13.2 % (ref 11.5–15.5)
WBC: 6.7 10*3/uL (ref 4.0–10.5)
WBC: 8.7 10*3/uL (ref 4.0–10.5)
WBC: 9.6 10*3/uL (ref 4.0–10.5)
nRBC: 0 % (ref 0.0–0.2)
nRBC: 0 % (ref 0.0–0.2)
nRBC: 0 % (ref 0.0–0.2)

## 2020-10-14 LAB — BASIC METABOLIC PANEL
Anion gap: 14 (ref 5–15)
Anion gap: 10 (ref 5–15)
BUN: 16 mg/dL (ref 8–23)
BUN: 13 mg/dL (ref 8–23)
CO2: 23 mmol/L (ref 22–32)
CO2: 20 mmol/L — ABNORMAL LOW (ref 22–32)
Calcium: 9.5 mg/dL (ref 8.9–10.3)
Calcium: 8.3 mg/dL — ABNORMAL LOW (ref 8.9–10.3)
Chloride: 104 mmol/L (ref 98–111)
Chloride: 108 mmol/L (ref 98–111)
Creatinine, Ser: 0.91 mg/dL (ref 0.44–1.00)
Creatinine, Ser: 0.8 mg/dL (ref 0.44–1.00)
GFR, Estimated: >60 mL/min (ref >60)
GFR, Estimated: >60 mL/min (ref >60)
Glucose, Bld: 98 mg/dL (ref 70–99)
Glucose, Bld: 141 mg/dL — ABNORMAL HIGH (ref 70–99)
Potassium: 3.7 mmol/L (ref 3.5–5.1)
Potassium: 4.6 mmol/L (ref 3.5–5.1)
Sodium: 141 mmol/L (ref 135–145)
Sodium: 138 mmol/L (ref 135–145)

## 2020-10-14 LAB — GLUCOSE, CAPILLARY
Glucose-Capillary: 110 mg/dL — ABNORMAL HIGH (ref 70–99)
Glucose-Capillary: 149 mg/dL — ABNORMAL HIGH (ref 70–99)
Glucose-Capillary: 143 mg/dL — ABNORMAL HIGH (ref 70–99)
Glucose-Capillary: 137 mg/dL — ABNORMAL HIGH (ref 70–99)
Glucose-Capillary: 139 mg/dL — ABNORMAL HIGH (ref 70–99)
Glucose-Capillary: 124 mg/dL — ABNORMAL HIGH (ref 70–99)
Glucose-Capillary: 135 mg/dL — ABNORMAL HIGH (ref 70–99)
Glucose-Capillary: 176 mg/dL — ABNORMAL HIGH (ref 70–99)
Glucose-Capillary: 150 mg/dL — ABNORMAL HIGH (ref 70–99)
Glucose-Capillary: 137 mg/dL — ABNORMAL HIGH (ref 70–99)

## 2020-10-14 LAB — PROTIME-INR
INR: 1.3 — ABNORMAL HIGH (ref 0.8–1.2)
Prothrombin Time: 15.4 seconds — ABNORMAL HIGH (ref 11.4–15.2)

## 2020-10-14 LAB — HEMOGLOBIN AND HEMATOCRIT, BLOOD
HCT: 23 % — ABNORMAL LOW (ref 36.0–46.0)
Hemoglobin: 7.4 g/dL — ABNORMAL LOW (ref 12.0–15.0)

## 2020-10-14 LAB — PLATELET COUNT: Platelets: 98 10*3/uL — ABNORMAL LOW (ref 150–400)

## 2020-10-14 LAB — MAGNESIUM: Magnesium: 2.9 mg/dL — ABNORMAL HIGH (ref 1.7–2.4)

## 2020-10-14 LAB — APTT: aPTT: 32 seconds (ref 24–36)

## 2020-10-14 LAB — HEPARIN LEVEL (UNFRACTIONATED): Heparin Unfractionated: 0.66 IU/mL (ref 0.30–0.70)

## 2020-10-14 SURGERY — CORONARY ARTERY BYPASS GRAFTING (CABG)
Anesthesia: General | Site: Chest

## 2020-10-14 MED ORDER — ACETAMINOPHEN 500 MG PO TABS
1000.0000 mg | ORAL_TABLET | Freq: Four times a day (QID) | ORAL | Status: DC
  Administered 2020-10-15 – 2020-10-16 (×6): 1000 mg via ORAL
  Filled 2020-10-14 (×6): qty 2

## 2020-10-14 MED ORDER — OXYCODONE HCL 5 MG PO TABS
5.0000 mg | ORAL_TABLET | ORAL | Status: DC | PRN
  Administered 2020-10-14 – 2020-10-15 (×3): 10 mg via ORAL
  Administered 2020-10-15: 5 mg via ORAL
  Administered 2020-10-15 (×2): 10 mg via ORAL
  Filled 2020-10-14: qty 1
  Filled 2020-10-14 (×4): qty 2

## 2020-10-14 MED ORDER — FENTANYL CITRATE (PF) 250 MCG/5ML IJ SOLN
INTRAMUSCULAR | Status: AC
  Filled 2020-10-14: qty 5

## 2020-10-14 MED ORDER — ACETAMINOPHEN 160 MG/5ML PO SOLN: 1000.0000 mg | Freq: Four times a day (QID) | ORAL | Status: DC

## 2020-10-14 MED ORDER — PROPOFOL 10 MG/ML IV BOLUS
INTRAVENOUS | Status: AC
  Filled 2020-10-14: qty 20

## 2020-10-14 MED ORDER — MIDAZOLAM HCL (PF) 10 MG/2ML IJ SOLN
INTRAMUSCULAR | Status: AC
  Filled 2020-10-14: qty 2

## 2020-10-14 MED ORDER — FAMOTIDINE IN NACL 20-0.9 MG/50ML-% IV SOLN
20.0000 mg | Freq: Two times a day (BID) | INTRAVENOUS | Status: AC
  Administered 2020-10-14 (×2): 20 mg via INTRAVENOUS
  Filled 2020-10-14 (×2): qty 50

## 2020-10-14 MED ORDER — ASPIRIN EC 325 MG PO TBEC
325.0000 mg | DELAYED_RELEASE_TABLET | Freq: Every day | ORAL | Status: DC
  Administered 2020-10-15: 325 mg via ORAL
  Filled 2020-10-14: qty 1

## 2020-10-14 MED ORDER — MAGNESIUM SULFATE 4 GM/100ML IV SOLN
4.0000 g | Freq: Once | INTRAVENOUS | Status: AC
  Administered 2020-10-14: 4 g via INTRAVENOUS
  Filled 2020-10-14: qty 100

## 2020-10-14 MED ORDER — LACTATED RINGERS IV SOLN: INTRAVENOUS | Status: DC | PRN

## 2020-10-14 MED ORDER — CHLORHEXIDINE GLUCONATE 0.12 % MT SOLN
15.0000 mL | OROMUCOSAL | Status: AC
  Administered 2020-10-14: 15 mL via OROMUCOSAL

## 2020-10-14 MED ORDER — EPHEDRINE SULFATE 50 MG/ML IJ SOLN: INTRAMUSCULAR | Status: DC | PRN

## 2020-10-14 MED ORDER — BISACODYL 5 MG PO TBEC
10.0000 mg | DELAYED_RELEASE_TABLET | Freq: Every day | ORAL | Status: DC
  Administered 2020-10-15: 10 mg via ORAL
  Filled 2020-10-14: qty 2

## 2020-10-14 MED ORDER — SODIUM CHLORIDE 0.9% FLUSH: 3.0000 mL | INTRAVENOUS | Status: DC | PRN

## 2020-10-14 MED ORDER — SUCCINYLCHOLINE CHLORIDE 20 MG/ML IJ SOLN: INTRAMUSCULAR | Status: DC | PRN

## 2020-10-14 MED ORDER — PHENYLEPHRINE 40 MCG/ML (10ML) SYRINGE FOR IV PUSH (FOR BLOOD PRESSURE SUPPORT)
PREFILLED_SYRINGE | INTRAVENOUS | Status: DC | PRN
  Administered 2020-10-14 (×2): 80 ug via INTRAVENOUS

## 2020-10-14 MED ORDER — ACETAMINOPHEN 650 MG RE SUPP
650.0000 mg | Freq: Once | RECTAL | Status: AC
  Administered 2020-10-14: 650 mg via RECTAL

## 2020-10-14 MED ORDER — MIDAZOLAM HCL 2 MG/2ML IJ SOLN: 2.0000 mg | INTRAMUSCULAR | Status: DC | PRN

## 2020-10-14 MED ORDER — DEXMEDETOMIDINE HCL IN NACL 400 MCG/100ML IV SOLN
0.0000 ug/kg/h | INTRAVENOUS | Status: DC
  Administered 2020-10-14: 0.7 ug/kg/h via INTRAVENOUS
  Filled 2020-10-14: qty 100

## 2020-10-14 MED ORDER — NITROGLYCERIN IN D5W 200-5 MCG/ML-% IV SOLN: 0.0000 ug/min | INTRAVENOUS | Status: DC

## 2020-10-14 MED ORDER — PHENOL 1.4 % MT LIQD: 1.0000 | OROMUCOSAL | Status: DC | PRN

## 2020-10-14 MED ORDER — ROCURONIUM BROMIDE 10 MG/ML (PF) SYRINGE
PREFILLED_SYRINGE | INTRAVENOUS | Status: DC | PRN
  Administered 2020-10-14: 10 mg via INTRAVENOUS
  Administered 2020-10-14: 60 mg via INTRAVENOUS
  Administered 2020-10-14: 30 mg via INTRAVENOUS
  Administered 2020-10-14: 5 mg via INTRAVENOUS
  Administered 2020-10-14: 30 mg via INTRAVENOUS
  Administered 2020-10-14: 50 mg via INTRAVENOUS

## 2020-10-14 MED ORDER — ROCURONIUM BROMIDE 10 MG/ML (PF) SYRINGE
PREFILLED_SYRINGE | INTRAVENOUS | Status: AC
  Filled 2020-10-14: qty 10

## 2020-10-14 MED ORDER — THROMBIN 20000 UNITS EX SOLR
OROMUCOSAL | Status: DC | PRN
  Administered 2020-10-14 (×3): 4 mL via TOPICAL

## 2020-10-14 MED ORDER — CHLORHEXIDINE GLUCONATE 0.12 % MT SOLN
15.0000 mL | Freq: Two times a day (BID) | OROMUCOSAL | Status: DC
  Administered 2020-10-14 – 2020-10-19 (×10): 15 mL via OROMUCOSAL
  Filled 2020-10-14 (×7): qty 15

## 2020-10-14 MED ORDER — PHENYLEPHRINE HCL-NACL 20-0.9 MG/250ML-% IV SOLN
INTRAVENOUS | Status: DC | PRN
  Administered 2020-10-14: 40 ug/min via INTRAVENOUS

## 2020-10-14 MED ORDER — INSULIN REGULAR(HUMAN) IN NACL 100-0.9 UT/100ML-% IV SOLN: INTRAVENOUS | Status: DC

## 2020-10-14 MED ORDER — METOPROLOL TARTRATE 5 MG/5ML IV SOLN: 2.5000 mg | INTRAVENOUS | Status: DC | PRN

## 2020-10-14 MED ORDER — SODIUM CHLORIDE 0.9% FLUSH
3.0000 mL | Freq: Two times a day (BID) | INTRAVENOUS | Status: DC
  Administered 2020-10-15 (×2): 3 mL via INTRAVENOUS

## 2020-10-14 MED ORDER — BISACODYL 10 MG RE SUPP: 10.0000 mg | Freq: Every day | RECTAL | Status: DC

## 2020-10-14 MED ORDER — SUCCINYLCHOLINE CHLORIDE 200 MG/10ML IV SOSY
PREFILLED_SYRINGE | INTRAVENOUS | Status: DC | PRN
  Administered 2020-10-14: 100 mg via INTRAVENOUS

## 2020-10-14 MED ORDER — SODIUM CHLORIDE 0.9 % IV SOLN: 250.0000 mL | INTRAVENOUS | Status: DC

## 2020-10-14 MED ORDER — ONDANSETRON HCL 4 MG/2ML IJ SOLN: 4.0000 mg | Freq: Four times a day (QID) | INTRAMUSCULAR | Status: DC | PRN

## 2020-10-14 MED ORDER — SODIUM CHLORIDE 0.9 % IV SOLN
1.5000 g | Freq: Two times a day (BID) | INTRAVENOUS | Status: AC
  Administered 2020-10-14 – 2020-10-16 (×4): 1.5 g via INTRAVENOUS
  Filled 2020-10-14 (×4): qty 1.5

## 2020-10-14 MED ORDER — FENTANYL CITRATE (PF) 250 MCG/5ML IJ SOLN
INTRAMUSCULAR | Status: DC | PRN
  Administered 2020-10-14: 50 ug via INTRAVENOUS
  Administered 2020-10-14: 100 ug via INTRAVENOUS
  Administered 2020-10-14: 450 ug via INTRAVENOUS
  Administered 2020-10-14: 50 ug via INTRAVENOUS
  Administered 2020-10-14: 150 ug via INTRAVENOUS
  Administered 2020-10-14: 50 ug via INTRAVENOUS

## 2020-10-14 MED ORDER — SODIUM CHLORIDE 0.45 % IV SOLN: INTRAVENOUS | Status: DC | PRN

## 2020-10-14 MED ORDER — MORPHINE SULFATE (PF) 2 MG/ML IV SOLN
1.0000 mg | INTRAVENOUS | Status: DC | PRN
  Administered 2020-10-14: 1 mg via INTRAVENOUS
  Administered 2020-10-14: 2 mg via INTRAVENOUS
  Administered 2020-10-14: 1 mg via INTRAVENOUS
  Administered 2020-10-15: 2 mg via INTRAVENOUS
  Filled 2020-10-14 (×2): qty 1
  Filled 2020-10-14: qty 2

## 2020-10-14 MED ORDER — SODIUM CHLORIDE (PF) 0.9 % IJ SOLN
INTRAMUSCULAR | Status: AC
  Filled 2020-10-14: qty 10

## 2020-10-14 MED ORDER — CHLORHEXIDINE GLUCONATE CLOTH 2 % EX PADS
6.0000 | MEDICATED_PAD | Freq: Every day | CUTANEOUS | Status: DC
  Administered 2020-10-14 – 2020-10-15 (×2): 6 via TOPICAL

## 2020-10-14 MED ORDER — PLASMA-LYTE 148 IV SOLN
INTRAVENOUS | Status: DC | PRN
  Administered 2020-10-14: 500 mL via INTRAVASCULAR

## 2020-10-14 MED ORDER — MIDAZOLAM HCL 5 MG/5ML IJ SOLN
INTRAMUSCULAR | Status: DC | PRN
  Administered 2020-10-14: 2 mg via INTRAVENOUS
  Administered 2020-10-14: 1 mg via INTRAVENOUS
  Administered 2020-10-14: 2 mg via INTRAVENOUS
  Administered 2020-10-14: 1 mg via INTRAVENOUS
  Administered 2020-10-14 (×2): 2 mg via INTRAVENOUS

## 2020-10-14 MED ORDER — DOCUSATE SODIUM 100 MG PO CAPS
200.0000 mg | ORAL_CAPSULE | Freq: Every day | ORAL | Status: DC
  Administered 2020-10-15: 200 mg via ORAL
  Filled 2020-10-14: qty 2

## 2020-10-14 MED ORDER — ALBUMIN HUMAN 5 % IV SOLN
250.0000 mL | INTRAVENOUS | Status: DC | PRN
  Administered 2020-10-14: 12.5 g via INTRAVENOUS

## 2020-10-14 MED ORDER — PROPOFOL 10 MG/ML IV BOLUS
INTRAVENOUS | Status: DC | PRN
  Administered 2020-10-14: 40 mg via INTRAVENOUS

## 2020-10-14 MED ORDER — 0.9 % SODIUM CHLORIDE (POUR BTL) OPTIME
TOPICAL | Status: DC | PRN
  Administered 2020-10-14: 6000 mL

## 2020-10-14 MED ORDER — HEPARIN SODIUM (PORCINE) 1000 UNIT/ML IJ SOLN
INTRAMUSCULAR | Status: DC | PRN
  Administered 2020-10-14: 28000 [IU] via INTRAVENOUS

## 2020-10-14 MED ORDER — PANTOPRAZOLE SODIUM 40 MG PO TBEC: 40.0000 mg | DELAYED_RELEASE_TABLET | Freq: Every day | ORAL | Status: DC

## 2020-10-14 MED ORDER — EPHEDRINE SULFATE-NACL 50-0.9 MG/10ML-% IV SOSY
PREFILLED_SYRINGE | INTRAVENOUS | Status: DC | PRN
  Administered 2020-10-14: 10 mg via INTRAVENOUS

## 2020-10-14 MED ORDER — ACETAMINOPHEN 160 MG/5ML PO SOLN: 650.0000 mg | Freq: Once | ORAL | Status: AC

## 2020-10-14 MED ORDER — METOPROLOL TARTRATE 12.5 MG HALF TABLET
12.5000 mg | ORAL_TABLET | Freq: Two times a day (BID) | ORAL | Status: DC
  Administered 2020-10-14 – 2020-10-15 (×3): 12.5 mg via ORAL
  Filled 2020-10-14 (×4): qty 1

## 2020-10-14 MED ORDER — LACTATED RINGERS IV SOLN: INTRAVENOUS | Status: DC

## 2020-10-14 MED ORDER — MIDAZOLAM HCL 5 MG/5ML IJ SOLN: INTRAMUSCULAR | Status: DC | PRN

## 2020-10-14 MED ORDER — THROMBIN (RECOMBINANT) 20000 UNITS EX SOLR
CUTANEOUS | Status: AC
  Filled 2020-10-14: qty 20000

## 2020-10-14 MED ORDER — THROMBIN 20000 UNITS EX SOLR
CUTANEOUS | Status: DC | PRN
  Administered 2020-10-14: 20000 [IU] via TOPICAL

## 2020-10-14 MED ORDER — ALBUMIN HUMAN 5 % IV SOLN: INTRAVENOUS | Status: DC | PRN

## 2020-10-14 MED ORDER — METOPROLOL TARTRATE 25 MG/10 ML ORAL SUSPENSION: 12.5000 mg | Freq: Two times a day (BID) | ORAL | Status: DC

## 2020-10-14 MED ORDER — TRAMADOL HCL 50 MG PO TABS
50.0000 mg | ORAL_TABLET | ORAL | Status: DC | PRN
  Administered 2020-10-15: 100 mg via ORAL
  Filled 2020-10-14: qty 2

## 2020-10-14 MED ORDER — SODIUM CHLORIDE 0.9 % IV SOLN: INTRAVENOUS | Status: DC

## 2020-10-14 MED ORDER — PROTAMINE SULFATE 10 MG/ML IV SOLN
INTRAVENOUS | Status: DC | PRN
  Administered 2020-10-14: 10 mg via INTRAVENOUS
  Administered 2020-10-14: 250 mg via INTRAVENOUS

## 2020-10-14 MED ORDER — VANCOMYCIN HCL IN DEXTROSE 1-5 GM/200ML-% IV SOLN
1000.0000 mg | Freq: Once | INTRAVENOUS | Status: AC
  Administered 2020-10-14: 1000 mg via INTRAVENOUS
  Filled 2020-10-14: qty 200

## 2020-10-14 MED ORDER — DEXTROSE 50 % IV SOLN: 0.0000 mL | INTRAVENOUS | Status: DC | PRN

## 2020-10-14 MED ORDER — ASPIRIN 81 MG PO CHEW: 324.0000 mg | CHEWABLE_TABLET | Freq: Every day | ORAL | Status: DC

## 2020-10-14 MED ORDER — PHENYLEPHRINE HCL-NACL 20-0.9 MG/250ML-% IV SOLN: 0.0000 ug/min | INTRAVENOUS | Status: DC

## 2020-10-14 MED ORDER — ORAL CARE MOUTH RINSE
15.0000 mL | Freq: Two times a day (BID) | OROMUCOSAL | Status: DC
  Administered 2020-10-14 – 2020-10-16 (×4): 15 mL via OROMUCOSAL

## 2020-10-14 MED ORDER — LACTATED RINGERS IV SOLN: 500.0000 mL | Freq: Once | INTRAVENOUS | Status: DC | PRN

## 2020-10-14 MED ORDER — POTASSIUM CHLORIDE 10 MEQ/50ML IV SOLN
10.0000 meq | INTRAVENOUS | Status: AC
Start: 2020-10-14 — End: 2020-10-14
  Administered 2020-10-14 (×3): 10 meq via INTRAVENOUS

## 2020-10-14 SURGICAL SUPPLY — 100 items
ADH SKN CLS APL DERMABOND .7 (GAUZE/BANDAGES/DRESSINGS) ×4
BAG DECANTER FOR FLEXI CONT (MISCELLANEOUS) ×3 IMPLANT
BLADE CLIPPER SURG (BLADE) ×3 IMPLANT
BLADE STERNUM SYSTEM 6 (BLADE) ×3 IMPLANT
BNDG CMPR MED 10X6 ELC LF (GAUZE/BANDAGES/DRESSINGS) ×4
BNDG ELASTIC 4X5.8 VLCR STR LF (GAUZE/BANDAGES/DRESSINGS) ×4 IMPLANT
BNDG ELASTIC 6X10 VLCR STRL LF (GAUZE/BANDAGES/DRESSINGS) ×2 IMPLANT
BNDG ELASTIC 6X5.8 VLCR STR LF (GAUZE/BANDAGES/DRESSINGS) ×3 IMPLANT
BNDG GAUZE ELAST 4 BULKY (GAUZE/BANDAGES/DRESSINGS) ×4 IMPLANT
CANISTER SUCT 3000ML PPV (MISCELLANEOUS) ×3 IMPLANT
CATH ROBINSON RED A/P 18FR (CATHETERS) ×6 IMPLANT
CATH THORACIC 28FR (CATHETERS) ×3 IMPLANT
CATH THORACIC 36FR (CATHETERS) ×3 IMPLANT
CATH THORACIC 36FR RT ANG (CATHETERS) ×3 IMPLANT
CLIP VESOCCLUDE MED 24/CT (CLIP) IMPLANT
CLIP VESOCCLUDE SM WIDE 24/CT (CLIP) ×1 IMPLANT
DERMABOND ADVANCED (GAUZE/BANDAGES/DRESSINGS) ×2
DERMABOND ADVANCED .7 DNX12 (GAUZE/BANDAGES/DRESSINGS) IMPLANT
DRAPE CARDIOVASCULAR INCISE (DRAPES) ×3
DRAPE SLUSH/WARMER DISC (DRAPES) ×3 IMPLANT
DRAPE SRG 135X102X78XABS (DRAPES) ×2 IMPLANT
DRSG COVADERM 4X14 (GAUZE/BANDAGES/DRESSINGS) ×3 IMPLANT
ELECT BLADE 4.0 EZ CLEAN MEGAD (MISCELLANEOUS) ×3
ELECT CAUTERY BLADE 6.4 (BLADE) ×3 IMPLANT
ELECT REM PT RETURN 9FT ADLT (ELECTROSURGICAL) ×6
ELECTRODE BLDE 4.0 EZ CLN MEGD (MISCELLANEOUS) IMPLANT
ELECTRODE REM PT RTRN 9FT ADLT (ELECTROSURGICAL) ×4 IMPLANT
FELT TEFLON 1X6 (MISCELLANEOUS) ×6 IMPLANT
GAUZE SPONGE 4X4 12PLY STRL (GAUZE/BANDAGES/DRESSINGS) ×7 IMPLANT
GLOVE BIOGEL PI IND STRL 6 (GLOVE) IMPLANT
GLOVE BIOGEL PI INDICATOR 6 (GLOVE)
GLOVE ORTHO TXT STRL SZ7.5 (GLOVE) IMPLANT
GLOVE SURG GAMMEX LF SZ6 (GLOVE) ×4 IMPLANT
GLOVE SURG UNDER POLY LF SZ6.5 (GLOVE) ×7 IMPLANT
GLOVE SURG UNDER POLY LF SZ7 (GLOVE) IMPLANT
GLOVE TRIUMPH SURG SIZE 7.0 (KITS) ×7 IMPLANT
GOWN STRL REUS W/ TWL LRG LVL3 (GOWN DISPOSABLE) ×8 IMPLANT
GOWN STRL REUS W/ TWL XL LVL3 (GOWN DISPOSABLE) ×2 IMPLANT
GOWN STRL REUS W/TWL LRG LVL3 (GOWN DISPOSABLE) ×18
GOWN STRL REUS W/TWL XL LVL3 (GOWN DISPOSABLE) ×3
HEMOSTAT POWDER SURGIFOAM 1G (HEMOSTASIS) ×9 IMPLANT
HEMOSTAT SURGICEL 2X14 (HEMOSTASIS) ×3 IMPLANT
INSERT FOGARTY 61MM (MISCELLANEOUS) IMPLANT
INSERT FOGARTY XLG (MISCELLANEOUS) IMPLANT
KIT BASIN OR (CUSTOM PROCEDURE TRAY) ×3 IMPLANT
KIT CATH CPB BARTLE (MISCELLANEOUS) ×3 IMPLANT
KIT SUCTION CATH 14FR (SUCTIONS) ×3 IMPLANT
KIT TURNOVER KIT B (KITS) ×3 IMPLANT
KIT VASOVIEW HEMOPRO 2 VH 4000 (KITS) ×3 IMPLANT
NS IRRIG 1000ML POUR BTL (IV SOLUTION) ×15 IMPLANT
PACK E OPEN HEART (SUTURE) ×3 IMPLANT
PACK OPEN HEART (CUSTOM PROCEDURE TRAY) ×3 IMPLANT
PAD ARMBOARD 7.5X6 YLW CONV (MISCELLANEOUS) ×6 IMPLANT
PAD ELECT DEFIB RADIOL ZOLL (MISCELLANEOUS) ×3 IMPLANT
PENCIL BUTTON HOLSTER BLD 10FT (ELECTRODE) ×3 IMPLANT
POSITIONER HEAD DONUT 9IN (MISCELLANEOUS) ×3 IMPLANT
PUNCH AORTIC ROTATE 4.0MM (MISCELLANEOUS) IMPLANT
PUNCH AORTIC ROTATE 4.5MM 8IN (MISCELLANEOUS) ×3 IMPLANT
PUNCH AORTIC ROTATE 5MM 8IN (MISCELLANEOUS) IMPLANT
SET CARDIOPLEGIA MPS 5001102 (MISCELLANEOUS) ×1 IMPLANT
SPONGE INTESTINAL PEANUT (DISPOSABLE) IMPLANT
SPONGE LAP 18X18 RF (DISPOSABLE) IMPLANT
SPONGE LAP 18X18 X RAY DECT (DISPOSABLE) ×2 IMPLANT
SPONGE LAP 4X18 RFD (DISPOSABLE) ×3 IMPLANT
SUPPORT HEART JANKE-BARRON (MISCELLANEOUS) ×3 IMPLANT
SUT BONE WAX W31G (SUTURE) ×3 IMPLANT
SUT MNCRL AB 4-0 PS2 18 (SUTURE) ×1 IMPLANT
SUT PROLENE 3 0 SH DA (SUTURE) IMPLANT
SUT PROLENE 3 0 SH1 36 (SUTURE) ×3 IMPLANT
SUT PROLENE 4 0 RB 1 (SUTURE)
SUT PROLENE 4 0 SH DA (SUTURE) IMPLANT
SUT PROLENE 4-0 RB1 .5 CRCL 36 (SUTURE) IMPLANT
SUT PROLENE 5 0 C 1 36 (SUTURE) IMPLANT
SUT PROLENE 6 0 C 1 30 (SUTURE) ×1 IMPLANT
SUT PROLENE 7 0 BV 1 (SUTURE) IMPLANT
SUT PROLENE 7 0 BV1 MDA (SUTURE) ×4 IMPLANT
SUT PROLENE 8 0 BV175 6 (SUTURE) ×2 IMPLANT
SUT SILK  1 MH (SUTURE)
SUT SILK 1 MH (SUTURE) IMPLANT
SUT SILK 2 0 SH (SUTURE) IMPLANT
SUT STEEL STERNAL CCS#1 18IN (SUTURE) IMPLANT
SUT STEEL SZ 6 DBL 3X14 BALL (SUTURE) ×3 IMPLANT
SUT VIC AB 1 CTX 36 (SUTURE) ×9
SUT VIC AB 1 CTX36XBRD ANBCTR (SUTURE) ×4 IMPLANT
SUT VIC AB 2-0 CT1 27 (SUTURE) ×6
SUT VIC AB 2-0 CT1 TAPERPNT 27 (SUTURE) IMPLANT
SUT VIC AB 2-0 CTX 27 (SUTURE) IMPLANT
SUT VIC AB 3-0 SH 27 (SUTURE)
SUT VIC AB 3-0 SH 27X BRD (SUTURE) IMPLANT
SUT VIC AB 3-0 X1 27 (SUTURE) IMPLANT
SUT VICRYL 4-0 PS2 18IN ABS (SUTURE) IMPLANT
SYSTEM SAHARA CHEST DRAIN ATS (WOUND CARE) ×3 IMPLANT
TAPE CLOTH SURG 4X10 WHT LF (GAUZE/BANDAGES/DRESSINGS) ×1 IMPLANT
TAPE PAPER 2X10 WHT MICROPORE (GAUZE/BANDAGES/DRESSINGS) ×1 IMPLANT
TOWEL GREEN STERILE (TOWEL DISPOSABLE) ×3 IMPLANT
TOWEL GREEN STERILE FF (TOWEL DISPOSABLE) ×3 IMPLANT
TRAY FOLEY SLVR 16FR TEMP STAT (SET/KITS/TRAYS/PACK) ×3 IMPLANT
TUBING LAP HI FLOW INSUFFLATIO (TUBING) ×3 IMPLANT
UNDERPAD 30X36 HEAVY ABSORB (UNDERPADS AND DIAPERS) ×3 IMPLANT
WATER STERILE IRR 1000ML POUR (IV SOLUTION) ×6 IMPLANT

## 2020-10-14 NOTE — Plan of Care (Signed)
°  Problem: Pain Managment: Goal: General experience of comfort will improve Outcome: Progressing   Problem: Skin Integrity: Goal: Risk for impaired skin integrity will decrease Outcome: Progressing   Problem: Cardiac: Goal: Ability to achieve and maintain adequate cardiopulmonary perfusion will improve Outcome: Progressing   Problem: Respiratory: Goal: Respiratory status will improve Outcome: Progressing

## 2020-10-14 NOTE — Discharge Summary (Signed)
Physician Discharge Summary       [address]       [place] [address]             [phone]    Patient ID: Danielle Ellis MRN: [id number] DOB/AGE: Oct 12, 1955 65 y.o.  Admit date: 10/09/2020 Discharge date: 10/19/2020  Admission Diagnoses: 1. NSTEMI (non-ST elevated myocardial infarction) ([place]) 2. Coronary artery disease  Discharge Diagnoses:  1. S/P CABG x 5 2. Expected post op blood loss anemia 3. History of Type 2 diabetes mellitus without complication, without long-term current use of insulin ([place]) 4. History of hypertension 5. History of OSA 6. History of anxiety   Consults: None  Procedure (s):  1. Median Sternotomy 2. Extracorporeal circulation 3.   Coronary artery bypass grafting x 5   Left internal mammary artery graft to the LAD  SVG to diagonal  Sequential SVG to OM1 and OM2  SVG to PDA 4.   Endoscopic vein harvest from both legs by Dr. Cyndia Bent on 10/14/2020.  History of Presenting Illness: The patient is a 65 year old woman with a history of diabetes, hyperlipidemia, hypertension, and a family history of premature coronary disease who presents with a several week history of recurrent episodes of substernal chest pain radiating into the left shoulder with numbness in her left hand.  She has had some associated shortness of breath and diaphoresis as well as nausea.  These episodes have been occurring after minimal activity at home as well as after eating.  On presentation she had mildly elevated troponin and electrocardiogram that showed diffuse ST depressions with new T wave inversions inferiorly.  2D echocardiogram today showed normal left ventricular systolic function with no significant valvular abnormality.  Cardiac catheterization showed severe three-vessel coronary disease.  Her daughter is here with her  At the time of consultation.  She lives at home with her husband.  Dr. Cyndia Bent discussed the need for coronary artery disease.  Potential risks, benefits, and complications of the surgery were discussed with the patient and she agreed to proceed with surgery. Pre operative carotid duplex US showed no significant internal carotid artery stenosis bilaterally. She underwent a CABG x 5 on 10/14/2020.   Brief Hospital Course:  Patient was extubated late the afternoon without difficulty. She remained afebrile and hemodynamically stable. Gordy Councilman, a line, chest tubes and foley were removed early in her post operative course. She was weaned of Nitro drip. She was started on Lopressor. She was volume overloaded and diuresed accordingly. She had expected post op blood loss anemia. She did not require a transfusion post op. Her last H and H was stable at 10 and 31.6. She was weaned off the Insulin drip. Her pre op HGA1C was 6.2 and she has borderline diabetes. Of note, she was on low dose Metformin prior to admission so will restart. She will need further follow up with her medical doctor after discharge. She will be given nutrition information with discharge paperwork. She was felt surgically stable for transfer from the ICU to 4E on 03/03. She is maintaining sinus rhythm. She had a NSTEMI so she was put on Plavix and the ec asa was decreased to 81 mg daily. She is ambulating on room air with good oxygenation. Her lower extremity wounds are clean, dry, and healing without signs of infection. Her proximal sternal wound has mild sero sanguinous drainage. She has been tolerating a diet and has had a bowel movement. Epicardial pacing wires were removed on 03/04. Chest tube  sutures will be removed in the office after discharge. She is felt surgically stable for discharge today.   Latest Vital Signs: Blood pressure 127/63, pulse 91, temperature 98.2 F (36.8 C), temperature source Oral, resp. rate 17, height $RemoveBe'5\' 1"'kIfjUfWsf$  (1.549 m), weight 95.1 kg, SpO2 97 %.  Physical Exam: Cardiovascular: RRR Pulmonary: Clear but diminished bibasilar breath  sounds Abdomen: Soft, non tender, bowel sounds present. Extremities:  Mild expected ecchymosis bilateral thighs. Minimal edema Wounds: Sternal and bilateral LE wounds are clean and dry.   Discharge Condition:Stable and discharged to home.  Recent laboratory studies:  Lab Results  Component Value Date   WBC 9.4 10/17/2020   HGB 10.0 (L) 10/17/2020   HCT 31.6 (L) 10/17/2020   MCV 91.3 10/17/2020   PLT 165 10/17/2020   Lab Results  Component Value Date   NA 134 (L) 10/17/2020   K 4.1 10/17/2020   CL 101 10/17/2020   CO2 24 10/17/2020   CREATININE 0.89 10/17/2020   GLUCOSE 95 10/17/2020      Diagnostic Studies: DG Chest 2 View  Result Date: 10/18/2020 CLINICAL DATA:  Chest pain EXAM: CHEST - 2 VIEW COMPARISON:  October 16, 2020 FINDINGS: No pneumothorax. The cardiomediastinal silhouette is stable. The right lung is clear. Small effusions underlying opacity on the left. No other interval changes. IMPRESSION: Small stable effusion with underlying opacity on the left. No other interval change or acute abnormality. Electronically Signed   By: Dorise Bullion III M.D   On: 10/18/2020 08:21   DG Chest 2 View  Result Date: 10/12/2020 CLINICAL DATA:  Preoperative cardiovascular exam. EXAM: CHEST - 2 VIEW COMPARISON:  Chest radiograph October 09, 2020 FINDINGS: The heart size and mediastinal contours are within normal limits. Both lungs are clear. The visualized skeletal structures are unremarkable. IMPRESSION: No active cardiopulmonary disease. Electronically Signed   By: Dahlia Bailiff MD   On: 10/12/2020 16:06   CARDIAC CATHETERIZATION  Result Date: 10/10/2020  Prox LAD to Mid LAD lesion is 60% stenosed.  Mid LAD lesion is 60% stenosed.  Prox Cx to Mid Cx lesion is 85% stenosed.  1st Diag lesion is 90% stenosed.  Prox RCA lesion is 90% stenosed.  Mid RCA to Dist RCA lesion is 99% stenosed.  The left ventricular systolic function is normal.  LV end diastolic pressure is normal.  The  left ventricular ejection fraction is 55-65% by visual estimate.  1. 3 vessel obstructive CAD   - long 60% areas of stenosis in the proximal and mid LAD with abnormal RFR of 0.85   - 90% ostial first diagonal   -85% proximal to mid LCx   - 90% proximal RCA. Long 99% mid RCA 2. Normal LV function 3. Normal LVEDP Plan: recommend CT surgery consultation for CABG   DG Chest Port 1 View  Result Date: 10/16/2020 CLINICAL DATA:  Post CABG EXAM: PORTABLE CHEST 1 VIEW COMPARISON:  None. FINDINGS: Interval removal of Swan-Ganz catheter, left chest tube and mediastinal drain. Bibasilar atelectasis, left greater than right. No pneumothorax. Mild cardiomegaly. IMPRESSION: Bibasilar atelectasis.  No pneumothorax. Electronically Signed   By: Rolm Baptise M.D.   On: 10/16/2020 08:36   DG Chest Port 1 View  Result Date: 10/15/2020 CLINICAL DATA:  Chest tube.  Open-heart surgery. EXAM: PORTABLE CHEST 1 VIEW COMPARISON:  10/14/2020. FINDINGS: Interim extubation and removal of NG tube. Swan-Ganz catheter, mediastinal drainage catheters in stable position. Left chest tube noted over the lower portion of the left chest. No pneumothorax. Prior  CABG. Heart size stable. Low lung volumes with persistent left base atelectasis and infiltrate. Tiny left pleural effusion cannot be excluded. IMPRESSION: 1. Interim extubation and removal of NG tube. Swan-Ganz catheter, mediastinal drainage catheters in stable position. Left chest tube noted over the lower portion of the left chest. No pneumothorax. 2. Low lung volumes with persistent left base atelectasis and infiltrate. Tiny left pleural effusion cannot be excluded. 3. Prior CABG.  Heart size stable. Electronically Signed   By: Marcello Moores  Register   On: 10/15/2020 07:53   DG Chest Port 1 View  Result Date: 10/14/2020 CLINICAL DATA:  Postoperative day 0 for CABG. EXAM: PORTABLE CHEST 1 VIEW COMPARISON:  10/12/2020 FINDINGS: Endotracheal tube tip approximately 1.9 cm above the carina. Right  IJ Swan-Ganz catheter tip: Main pulmonary artery. Nasogastric tube enters the stomach. Mediastinal drain and left-sided chest tube noted. Low lung volumes are present, causing crowding of the pulmonary vasculature. Upper normal heart size. Retrocardiac airspace opacity on the left favoring atelectasis. No discrete pneumothorax identified. Indistinctness along the AP window, not uncommon in the setting of recent CABG. Mild right perihilar atelectasis. No significant blunting of the costophrenic angles. The patient is rotated to the right on today's radiograph, reducing diagnostic sensitivity and specificity. IMPRESSION: 1. Retrocardiac airspace opacity on the left favoring atelectasis. Mild right perihilar atelectasis. 2. Endotracheal tube tip approximately 1.9 cm above the carina. Tubes and lines appear satisfactorily position. 3. Low lung volumes. Electronically Signed   By: Van Clines M.D.   On: 10/14/2020 14:37   DG Chest Portable 1 View  Result Date: 10/09/2020 CLINICAL DATA:  Chest pain EXAM: PORTABLE CHEST 1 VIEW COMPARISON:  None. FINDINGS: The heart size and mediastinal contours are within normal limits. Mild aortic atherosclerosis. Both lungs are clear. The visualized skeletal structures are unremarkable. IMPRESSION: No active disease. Electronically Signed   By: Donavan Foil M.D.   On: 10/09/2020 19:27   ECHOCARDIOGRAM COMPLETE  Result Date: 10/10/2020    ECHOCARDIOGRAM REPORT   Patient Name:   SARAHELIZABETH CONWAY Date of Exam: 10/10/2020 Medical Rec #:  [id number]        Height:       61.0 in Accession #:    [id number]       Weight:       205.1 lb Date of Birth:  May 26, 1956        BSA:          1.909 m Patient Age:    58 years         BP:           131/67 mmHg Patient Gender: F                HR:           94 bpm. Exam Location:  Inpatient Procedure: 2D Echo, Cardiac Doppler and Color Doppler Indications:    Acute myocardial infarction  History:        Patient has no prior history of  Echocardiogram examinations.                 CAD; Risk Factors:Diabetes, Hypertension and Dyslipidemia.  Sonographer:    Clayton Lefort RDCS (AE) Referring Phys: [id number] [facility]  Sonographer Comments: Suboptimal subcostal window. Image acquisition challenging due to patient body habitus. IMPRESSIONS  1. Left ventricular ejection fraction, by estimation, is 70 to 75%. The left ventricle has hyperdynamic function. The left ventricle has no regional wall motion abnormalities. Left ventricular diastolic parameters are  consistent with Grade I diastolic dysfunction (impaired relaxation).  2. Right ventricular systolic function is normal. The right ventricular size is normal.  3. The mitral valve is normal in structure. No evidence of mitral valve regurgitation. No evidence of mitral stenosis.  4. The aortic valve is normal in structure. Aortic valve regurgitation is not visualized. Mild to moderate aortic valve sclerosis/calcification is present, without any evidence of aortic stenosis.  5. The inferior vena cava is normal in size with greater than 50% respiratory variability, suggesting right atrial pressure of 3 mmHg. FINDINGS  Left Ventricle: Left ventricular ejection fraction, by estimation, is 70 to 75%. The left ventricle has hyperdynamic function. The left ventricle has no regional wall motion abnormalities. Definity contrast agent was given IV to delineate the left ventricular endocardial borders. The left ventricular internal cavity size was normal in size. There is no left ventricular hypertrophy. Left ventricular diastolic parameters are consistent with Grade I diastolic dysfunction (impaired relaxation). Right Ventricle: The right ventricular size is normal. No increase in right ventricular wall thickness. Right ventricular systolic function is normal. Left Atrium: Left atrial size was normal in size. Right Atrium: Right atrial size was normal in size. Pericardium: There is no evidence of pericardial  effusion. Mitral Valve: The mitral valve is normal in structure. Mild mitral annular calcification. No evidence of mitral valve regurgitation. No evidence of mitral valve stenosis. MV peak gradient, 5.6 mmHg. The mean mitral valve gradient is 1.0 mmHg. Tricuspid Valve: The tricuspid valve is normal in structure. Tricuspid valve regurgitation is not demonstrated. No evidence of tricuspid stenosis. Aortic Valve: The aortic valve is normal in structure. Aortic valve regurgitation is not visualized. Mild to moderate aortic valve sclerosis/calcification is present, without any evidence of aortic stenosis. Aortic valve mean gradient measures 5.0 mmHg. Aortic valve peak gradient measures 8.1 mmHg. Aortic valve area, by VTI measures 2.19 cm. Pulmonic Valve: The pulmonic valve was normal in structure. Pulmonic valve regurgitation is not visualized. No evidence of pulmonic stenosis. Aorta: The aortic root is normal in size and structure. Venous: The inferior vena cava is normal in size with greater than 50% respiratory variability, suggesting right atrial pressure of 3 mmHg. IAS/Shunts: No atrial level shunt detected by color flow Doppler.  LEFT VENTRICLE PLAX 2D LVIDd:         4.00 cm  Diastology LVIDs:         2.10 cm  LV e' medial:    4.24 cm/s LV PW:         1.40 cm  LV E/e' medial:  12.1 LV IVS:        1.10 cm  LV e' lateral:   8.49 cm/s LVOT diam:     2.00 cm  LV E/e' lateral: 6.1 LV SV:         53 LV SV Index:   28 LVOT Area:     3.14 cm  RIGHT VENTRICLE RV Basal diam:  2.80 cm RV S prime:     21.30 cm/s TAPSE (M-mode): 2.1 cm LEFT ATRIUM           Index       RIGHT ATRIUM           Index LA diam:      4.00 cm 2.10 cm/m  RA Area:     11.20 cm LA Vol (A2C): 27.4 ml 14.35 ml/m RA Volume:   25.00 ml  13.10 ml/m LA Vol (A4C): 46.8 ml 24.51 ml/m  AORTIC VALVE AV Area (Vmax):  2.37 cm AV Area (Vmean):   2.32 cm AV Area (VTI):     2.19 cm AV Vmax:           142.00 cm/s AV Vmean:          102.000 cm/s AV VTI:             0.242 m AV Peak Grad:      8.1 mmHg AV Mean Grad:      5.0 mmHg LVOT Vmax:         107.00 cm/s LVOT Vmean:        75.400 cm/s LVOT VTI:          0.169 m LVOT/AV VTI ratio: 0.70  AORTA Ao Root diam: 3.00 cm Ao Asc diam:  3.10 cm MITRAL VALVE MV Area (PHT): 3.89 cm     SHUNTS MV Area VTI:   2.54 cm     Systemic VTI:  0.17 m MV Peak grad:  5.6 mmHg     Systemic Diam: 2.00 cm MV Mean grad:  1.0 mmHg MV Vmax:       1.18 m/s MV Vmean:      54.7 cm/s MV Decel Time: 195 msec MV E velocity: 51.40 cm/s MV A velocity: 105.00 cm/s MV E/A ratio:  0.49 Candee Furbish MD Electronically signed by Candee Furbish MD Signature Date/Time: 10/10/2020/2:17:26 PM    Final    VAS US DOPPLER PRE CABG  Result Date: 10/11/2020 PREOPERATIVE VASCULAR EVALUATION  Indications:      Pre-CABG. Risk Factors:     Hypertension, hyperlipidemia, Diabetes, coronary artery                   disease. Limitations:      Multiple IVs. Comparison Study: No prior studies. Performing Technologist: Darlin Coco RDMS, RVT  Examination Guidelines: A complete evaluation includes B-mode imaging, spectral Doppler, color Doppler, and power Doppler as needed of all accessible portions of each vessel. Bilateral testing is considered an integral part of a complete examination. Limited examinations for reoccurring indications may be performed as noted.  Right Carotid Findings: +----------+--------+--------+--------+------------+--------+             PSV cm/s EDV cm/s Stenosis Describe     Comments  +----------+--------+--------+--------+------------+--------+  CCA Prox   104      15                                       +----------+--------+--------+--------+------------+--------+  CCA Distal 81       17                                       +----------+--------+--------+--------+------------+--------+  ICA Prox   68       27       1-39%    heterogenous           +----------+--------+--------+--------+------------+--------+  ICA Distal 99       35                              tortuous  +----------+--------+--------+--------+------------+--------+  ECA        122                                               +----------+--------+--------+--------+------------+--------+  Portions of this table do not appear on this page. +----------+--------+-------+----------------+------------+             PSV cm/s EDV cms Describe         Arm Pressure  +----------+--------+-------+----------------+------------+  Subclavian 118              Multiphasic, WNL               +----------+--------+-------+----------------+------------+ +---------+--------+--+--------+--+---------+  Vertebral PSV cm/s 50 EDV cm/s 12 Antegrade  +---------+--------+--+--------+--+---------+ Left Carotid Findings: +----------+--------+--------+--------+------------+--------+             PSV cm/s EDV cm/s Stenosis Describe     Comments  +----------+--------+--------+--------+------------+--------+  CCA Prox   100      25                                       +----------+--------+--------+--------+------------+--------+  CCA Distal 83       25                                       +----------+--------+--------+--------+------------+--------+  ICA Prox   71       19       1-39%    heterogenous           +----------+--------+--------+--------+------------+--------+  ICA Distal 80       28                             tortuous  +----------+--------+--------+--------+------------+--------+  ECA        76                                                +----------+--------+--------+--------+------------+--------+ +----------+--------+--------+----------------+------------+  Subclavian PSV cm/s EDV cm/s Describe         Arm Pressure  +----------+--------+--------+----------------+------------+             136               Multiphasic, WNL               +----------+--------+--------+----------------+------------+ +---------+--------+--+--------+--+---------+  Vertebral PSV cm/s 58 EDV cm/s 22 Antegrade   +---------+--------+--+--------+--+---------+  ABI Findings: +---------+-----------------+-----+---------+---------------------------------+  Right     Rt Pressure       Index Waveform  Comment                                       (mmHg)                                                               +---------+-----------------+-----+---------+---------------------------------+  Brachial                          triphasic Unable to obtain pressure due to  IV                                 +---------+-----------------+-----+---------+---------------------------------+  PTA       202               1.26  triphasic                                    +---------+-----------------+-----+---------+---------------------------------+  DP        201               1.26  triphasic                                    +---------+-----------------+-----+---------+---------------------------------+  Great Toe 166               1.04  Normal                                       +---------+-----------------+-----+---------+---------------------------------+ +---------+------------------+-----+---------+-------+  Left      Lt Pressure (mmHg) Index Waveform  Comment  +---------+------------------+-----+---------+-------+  Brachial  160                      triphasic          +---------+------------------+-----+---------+-------+  PTA       206                1.29  triphasic          +---------+------------------+-----+---------+-------+  DP        205                1.28  triphasic          +---------+------------------+-----+---------+-------+  Great Toe 193                1.21  Normal             +---------+------------------+-----+---------+-------+ +-------+---------------+----------------+  ABI/TBI Today's ABI/TBI Previous ABI/TBI  +-------+---------------+----------------+  Right   1.26/1.04                         +-------+---------------+----------------+  Left    1.29/1.21                          +-------+---------------+----------------+  Right Doppler Findings: +--------+--------+-----+---------+-----------------------------------+  Site     Pressure Index Doppler   Comments                             +--------+--------+-----+---------+-----------------------------------+  Brachial                triphasic Unable to obtain pressure due to IV  +--------+--------+-----+---------+-----------------------------------+  Radial                  triphasic                                      +--------+--------+-----+---------+-----------------------------------+  Ulnar  triphasic                                      +--------+--------+-----+---------+-----------------------------------+  Left Doppler Findings: +--------+--------+-----+---------+--------+  Site     Pressure Index Doppler   Comments  +--------+--------+-----+---------+--------+  Brachial 160            triphasic           +--------+--------+-----+---------+--------+  Radial                  triphasic           +--------+--------+-----+---------+--------+  Ulnar                   triphasic           +--------+--------+-----+---------+--------+  Summary: Right Carotid: Velocities in the right ICA are consistent with a 1-39% stenosis. Left Carotid: Velocities in the left ICA are consistent with a 1-39% stenosis. Vertebrals:  Bilateral vertebral arteries demonstrate antegrade flow. Subclavians: Normal flow hemodynamics were seen in bilateral subclavian              arteries. Right ABI: Resting right ankle-brachial index is within normal range. No evidence of significant right lower extremity arterial disease. The right toe-brachial index is normal. Left ABI: Resting left ankle-brachial index is within normal range. No evidence of significant left lower extremity arterial disease. The left toe-brachial index is normal. Right Upper Extremity: Doppler waveforms remain within normal limits with right radial compression.  Doppler waveforms remain within normal limits with right ulnar compression. Left Upper Extremity: Doppler waveforms remain within normal limits with left radial compression. Doppler waveforms remain within normal limits with left ulnar compression.  Electronically signed by Ruta Hinds MD on 10/11/2020 at 10:41:05 AM.    Final    Discharge Instructions    Amb Referral to Cardiac Rehabilitation   Complete by: As directed    Diagnosis:  NSTEMI CABG     CABG X ___: 5   After initial evaluation and assessments completed: Virtual Based Care may be provided alone or in conjunction with Phase 2 Cardiac Rehab based on patient barriers.: Yes     Discharge Medications: Allergies as of 10/19/2020   No Known Allergies     Medication List    STOP taking these medications   baclofen 10 MG tablet Commonly known as: LIORESAL   Fish Oil 1000 MG Caps   multivitamin with minerals tablet   nitroGLYCERIN 0.4 MG SL tablet Commonly known as: NITROSTAT   valsartan-hydrochlorothiazide 160-25 MG tablet Commonly known as: DIOVAN-HCT     TAKE these medications   blood glucose meter kit and supplies Dispense based on patient and insurance preference. Use up to four times daily as directed. (FOR ICD-10 E10.9, E11.9).   calcium gluconate 500 MG tablet Take 500 mg by mouth daily.   clopidogrel 75 MG tablet Commonly known as: PLAVIX Take 1 tablet (75 mg total) by mouth daily. Start taking on: October 20, 2020   escitalopram 10 MG tablet Commonly known as: LEXAPRO Take 1 tablet (10 mg total) by mouth daily.   latanoprost 0.005 % ophthalmic solution Commonly known as: XALATAN Place 1 drop into both eyes at bedtime.   metFORMIN 500 MG tablet Commonly known as: GLUCOPHAGE Take 1 tablet (500 mg total) by mouth daily with breakfast.   metoprolol tartrate 25 MG tablet Commonly known as: LOPRESSOR Take 1 tablet (25 mg  total) by mouth 2 (two) times daily.   multivitamin capsule Take 1 capsule by  mouth daily.   oxyCODONE-acetaminophen 7.5-325 MG tablet Commonly known as: Percocet Take 1 tablet by mouth every 4 (four) hours as needed for up to 7 days for severe pain.   rosuvastatin 5 MG tablet Commonly known as: Crestor Take 1 tablet (5 mg total) by mouth every other day.   valACYclovir 1000 MG tablet Commonly known as: VALTREX TAKE 2 TABLETS BY MOUTH  TWICE DAILY       Follow Up Appointments:  Follow-up Information    Gaye Pollack, MD. Go on 11/12/2020.   Specialty: Cardiothoracic Surgery Why: PA/LAT CXR to be taken (at [facility] which is in the same building as Dr. Vivi Martens office) on 03/30 at 11:30 am;Appointment time is at 12:00 pm Contact information: [address] [place]  [address] [phone]        [facility]. Go on 10/28/2020.   Specialty: Cardiothoracic Surgery Why: Appointment is with nurse only for chest tube suture removal. Appointment time is at 11:30 am Contact information: [address] [place], [place], [place], NP. Go on 11/06/2020.   Specialty: Cardiology Why: Appointment time is at 2:15 pm Contact information: [address] [place] [address] [phone]        [name], FNP. Call.   Specialty: Family Medicine Why: for a follow up appointment regarding further surveillance of HGA1C 6.2 (pre diabetes) Contact information: [place] [address] [phone]              The patient has been discharged on:   1.Beta Blocker:  Yes [  x ]                              No   [   ]                              If No, reason:  2.Ace Inhibitor/ARB: Yes [   ]                                     No  [  x  ]                                     If No, reason: BP to soft  3.Statin:   Yes [ x  ]                  No  [   ]                  If No,  reason:  4.Ecasa:  Yes  [  x ]                  No   [   ]                  If No, reason:  Signed: [place] Cassetta G. RoddenberryPA-C 10/19/2020, 9:35 AM

## 2020-10-14 NOTE — Procedures (Signed)
Extubation Procedure Note  Patient Details:   Name: Danielle Ellis DOB: [birth year] MRN: [id number]   Airway Documentation:    Vent end date: 10/14/20 Vent end time: 1658   Evaluation  O2 sats: stable throughout Complications: No apparent complications Patient did tolerate procedure well. Bilateral Breath Sounds: Clear   Yes   Patient was extubated to a 4L [place] without any complications, dyspnea or stridor noted. VC: 6, NIF: -40, positive cuff leak prior to extubation.   [name] 10/14/2020, 4:48 PM

## 2020-10-14 NOTE — Transfer of Care (Signed)
Immediate Anesthesia Transfer of Care Note  Patient: Danielle Ellis  Procedure(s) Performed: CORONARY ARTERY BYPASS GRAFTING (CABG) X FIVE, USING LEFT INTERNAL MAMMARY ARTERY AND BILATERAL LEGS GREATER SAPHENOUS VEINS HARVESTED ENDOSCOPICALLY (N/A Chest) TRANSESOPHAGEAL ECHOCARDIOGRAM (TEE) (N/A )  Patient Location: SICU  Anesthesia Type:General  Level of Consciousness: sedated and Patient remains intubated per anesthesia plan  Airway & Oxygen Therapy: Patient remains intubated per anesthesia plan and Patient placed on Ventilator (see vital sign flow sheet for setting)  Post-op Assessment: Report given to RN and Post -op Vital signs reviewed and stable  Post vital signs: Reviewed and stable  Last Vitals:  Vitals Value Taken Time  BP 112/71 10/14/20 1345  Temp 36.2 C 10/14/20 1351  Pulse 80 10/14/20 1351  Resp 16 10/14/20 1351  SpO2 98 % 10/14/20 1351  Vitals shown include unvalidated device data.  Last Pain:  Vitals:   10/14/20 0542  TempSrc: Oral  PainSc:       Patients Stated Pain Goal: 0 (10/13/20 0800)  Complications: No complications documented.

## 2020-10-14 NOTE — Anesthesia Postprocedure Evaluation (Signed)
Anesthesia Post Note  Patient: Danielle Ellis  Procedure(s) Performed: CORONARY ARTERY BYPASS GRAFTING (CABG) X FIVE, USING LEFT INTERNAL MAMMARY ARTERY AND BILATERAL LEGS GREATER SAPHENOUS VEINS HARVESTED ENDOSCOPICALLY (N/A Chest) TRANSESOPHAGEAL ECHOCARDIOGRAM (TEE) (N/A )     Patient location during evaluation: SICU Anesthesia Type: General Level of consciousness: sedated Pain management: pain level controlled Vital Signs Assessment: post-procedure vital signs reviewed and stable Respiratory status: patient remains intubated per anesthesia plan Cardiovascular status: stable Postop Assessment: no apparent nausea or vomiting Anesthetic complications: no   No complications documented.  Last Vitals:  Vitals:   10/14/20 1600 10/14/20 1615  BP: (!) 99/58   Pulse: 75 74  Resp: (!) 28 20  Temp: 36.5 C 36.5 C  SpO2: 97% 99%    Last Pain:  Vitals:   10/14/20 1600  TempSrc: Core  PainSc:                  Kennieth Rad

## 2020-10-14 NOTE — Anesthesia Procedure Notes (Addendum)
Arterial Line Insertion Start/End3/08/2020 7:36 AM, 10/14/2020 7:45 AM Performed by: Marcene Duos, MD, Rosalio Macadamia, CRNA, CRNA  Patient location: Pre-op. Preanesthetic checklist: patient identified, IV checked, risks and benefits discussed, surgical consent, monitors and equipment checked, pre-op evaluation and timeout performed Lidocaine 1% used for infiltration and patient sedated Left, radial was placed Catheter size: 20 G Hand hygiene performed , maximum sterile barriers used  and Seldinger technique used Allen's test indicative of satisfactory collateral circulation Attempts: 1 Procedure performed without using ultrasound guided technique. Following insertion, dressing applied and Biopatch. Post procedure assessment: normal  Patient tolerated the procedure well with no immediate complications.

## 2020-10-14 NOTE — Anesthesia Procedure Notes (Signed)
Central Venous Catheter Insertion Performed by: Beryle Lathe, MD, anesthesiologist Start/End3/08/2020 7:09 AM, 10/14/2020 7:11 AM Patient location: Pre-op. Preanesthetic checklist: patient identified, IV checked, risks and benefits discussed, surgical consent, monitors and equipment checked, pre-op evaluation, timeout performed and anesthesia consent Position: Trendelenburg Hand hygiene performed  and maximum sterile barriers used  Total catheter length 10. PA cath was placed.Swan type:thermodilution PA Cath depth:47 Procedure performed without using ultrasound guided technique. Attempts: 1 Patient tolerated the procedure well with no immediate complications.

## 2020-10-14 NOTE — Op Note (Signed)
CARDIOVASCULAR SURGERY OPERATIVE NOTE  10/14/2020  Surgeon:  Alleen Borne, MD  First Assistant: Doree Fudge,  PA-C   Preoperative Diagnosis:  Severe multi-vessel coronary artery disease   Postoperative Diagnosis:  Same   Procedure:  1. Median Sternotomy 2. Extracorporeal circulation 3.   Coronary artery bypass grafting x 5   Left internal mammary artery graft to the LAD  SVG to diagonal  Sequential SVG to OM1 and OM2  SVG to PDA 4.   Endoscopic vein harvest from both legs   Anesthesia:  General Endotracheal   Clinical History/Surgical Indication:  This 65 year old woman has severe three-vessel coronary disease with unstable anginal symptoms presenting with a non-ST segment elevation MI with mildly elevated troponin.  I agree that coronary bypass graft surgery is the best treatment for relief of her symptoms and to preserve myocardium.  I discussed the operative procedure with the patient and her daughter and husband including alternatives, benefits and risks; including but not limited to bleeding, blood transfusion, infection, stroke, myocardial infarction, graft failure, heart block requiring a permanent pacemaker, organ dysfunction, and death.  Mylinda Latina understands and agrees to proceed.  We will schedule surgery for Tuesday morning.   Preparation:  The patient was seen in the preoperative holding area and the correct patient, correct operation were confirmed with the patient after reviewing the medical record and catheterization. The consent was signed by me. Preoperative antibiotics were given. A pulmonary arterial line and radial arterial line were placed by the anesthesia team. The patient was taken back to the operating room and positioned supine on the operating room table. After being placed under general endotracheal anesthesia by the anesthesia team a  foley catheter was placed. The neck, chest, abdomen, and both legs were prepped with betadine soap and solution and draped in the usual sterile manner. A surgical time-out was taken and the correct patient and operative procedure were confirmed with the nursing and anesthesia staff.   Cardiopulmonary Bypass:  A median sternotomy was performed. The pericardium was opened in the midline. Right ventricular function appeared normal. The ascending aorta was of normal size and had no palpable plaque. There were no contraindications to aortic cannulation or cross-clamping. The patient was fully systemically heparinized and the ACT was maintained > 400 sec. The proximal aortic arch was cannulated with a 20 F aortic cannula for arterial inflow. Venous cannulation was performed via the right atrial appendage using a two-staged venous cannula. An antegrade cardioplegia/vent cannula was inserted into the mid-ascending aorta. Aortic occlusion was performed with a single cross-clamp. Systemic cooling to 32 degrees Centigrade and topical cooling of the heart with iced saline were used. Hyperkalemic antegrade cold blood cardioplegia was used to induce diastolic arrest and was then given at about 20 minute intervals throughout the period of arrest to maintain myocardial temperature at or below 10 degrees centigrade. A temperature probe was inserted into the interventricular septum and an insulating pad was placed in the pericardium.   Left internal mammary artery harvest:  The left side of the sternum was retracted using the Rultract retractor. The left internal mammary artery was harvested as a pedicle graft. All side branches were clipped. It was a medium-sized vessel of good quality with excellent blood flow. It was ligated distally and divided. It was sprayed with topical papaverine solution to prevent vasospasm.   Endoscopic vein harvest:  The right greater saphenous vein was harvested endoscopically through a 2  cm incision medial to the right knee. It was  harvested from the upper thigh to below the knee. It was a medium-sized vein of good quality in the thigh but the lower portion from the leg was too small. Therefor a second segment of GSV was harvested from the left thigh endoscopically. It was a medium caliber vein of good quality. The side branches were all ligated with 4-0 silk ties.    Coronary arteries:  The coronary arteries were examined.   LAD:  Large vessel with minimal distal disease. Diagonal was a medium caliber vessel.  LCX:  OM1 and OM2 were both large caliber vessels with no distal disease.  RCA:  Diffusely diseased up to the origin of the PDA. The PDA was a medium caliber vessel with no distal disease.   Grafts:  1. LIMA to the LAD: 2.5 mm. It was sewn end to side using 8-0 prolene continuous suture. 2. SVG to diagonal:  1.5 mm. It was sewn end to side using 7-0 prolene continuous suture. 3. Sequential SVG to OM1:  1.75 mm. It was sewn sequential side to side using 7-0 prolene continuous suture. 4.   Sequential SVG to OM2:  1.75 mm. It was sewn sequential end to side using 7-0 prolene       continuous suture. 5.   SVG to PDA:  1.6 mm. It was sewn end to side using 7-0 prolene continuous suture.  The proximal vein graft anastomoses were performed to the mid-ascending aorta using continuous 6-0 prolene suture. Graft markers were placed around the proximal anastomoses.   Completion:  The patient was rewarmed to 37 degrees Centigrade. The clamp was removed from the LIMA pedicle and there was rapid warming of the septum and return of ventricular fibrillation. The crossclamp was removed with a time of 99 minutes. There was spontaneous return of sinus rhythm. The distal and proximal anastomoses were checked for hemostasis. The position of the grafts was satisfactory. Two temporary epicardial pacing wires were placed on the right atrium and two on the right ventricle. The patient was  weaned from CPB without difficulty on no inotropes. CPB time was 115 minutes. Cardiac output was 4 LPM. TEE showed normal LV systolic function. Heparin was fully reversed with protamine and the aortic and venous cannulas removed. Hemostasis was achieved. Mediastinal and left pleural drainage tubes were placed. The sternum was closed with double #6 stainless steel wires. The fascia was closed with continuous # 1 vicryl suture. The subcutaneous tissue was closed with 2-0 vicryl continuous suture. The skin was closed with 3-0 vicryl subcuticular suture. All sponge, needle, and instrument counts were reported correct at the end of the case. Dry sterile dressings were placed over the incisions and around the chest tubes which were connected to pleurevac suction. The patient was then transported to the surgical intensive care unit in stable condition.

## 2020-10-14 NOTE — Anesthesia Procedure Notes (Signed)
Central Venous Catheter Insertion Performed by: Audry Pili, MD, anesthesiologist Start/End3/08/2020 7:00 AM, 10/14/2020 7:11 AM Patient location: Pre-op. Preanesthetic checklist: patient identified, IV checked, risks and benefits discussed, surgical consent, monitors and equipment checked, pre-op evaluation, timeout performed and anesthesia consent Position: Trendelenburg Lidocaine 1% used for infiltration and patient sedated Hand hygiene performed , maximum sterile barriers used  and Seldinger technique used Catheter size: 8.5 Fr Central line was placed.MAC introducer Procedure performed using ultrasound guided technique. Ultrasound Notes:anatomy identified, needle tip was noted to be adjacent to the nerve/plexus identified, no ultrasound evidence of intravascular and/or intraneural injection and image(s) printed for medical record Attempts: 1 Following insertion, line sutured, dressing applied and Biopatch. Post procedure assessment: blood return through all ports, no air and free fluid flow  Patient tolerated the procedure well with no immediate complications.

## 2020-10-14 NOTE — Progress Notes (Signed)
  Echocardiogram Echocardiogram Transesophageal has been performed.  Danielle Ellis [name] 10/14/2020, 8:37 AM 

## 2020-10-14 NOTE — Progress Notes (Signed)
      [address]       ,[place] [address]             [phone]      S/p CABG x 5   BP 126/67   Pulse (!) 103   Temp 97.9 F (36.6 C)   Resp (!) 26   Ht 5\' 1"  (1.549 m)   Wt 93.8 kg   SpO2 100%   BMI 39.06 kg/m  4L [place] 99% sat  27/18 CI = 2.3  Intake/Output Summary (Last 24 hours) at 10/14/2020 1810 Last data filed at 10/14/2020 1800 Gross per 24 hour  Intake 4647.49 ml  Output 3030 ml  Net 1617.49 ml   K= 4.9, Hct = 30  Doing well early postop  12/14/2020 C. Viviann Spare, MD [facility] [phone]

## 2020-10-14 NOTE — Anesthesia Procedure Notes (Signed)
Procedure Name: Intubation Date/Time: 10/14/2020 7:32 AM Performed by: Verdie Drown, CRNA Pre-anesthesia Checklist: Patient identified, Emergency Drugs available, Suction available, Patient being monitored and Timeout performed Patient Re-evaluated:Patient Re-evaluated prior to induction Oxygen Delivery Method: Circle system utilized Preoxygenation: Pre-oxygenation with 100% oxygen Induction Type: IV induction Ventilation: Oral airway inserted - appropriate to patient size and Two handed mask ventilation required Laryngoscope Size: Mac and 3 Grade View: Grade I Tube type: Oral Tube size: 7.5 mm Number of attempts: 1 Airway Equipment and Method: Stylet Placement Confirmation: ETT inserted through vocal cords under direct vision,  positive ETCO2 and breath sounds checked- equal and bilateral Secured at: 22 cm Tube secured with: Tape Dental Injury: Teeth and Oropharynx as per pre-operative assessment

## 2020-10-14 NOTE — Brief Op Note (Addendum)
10/09/2020 - 10/14/2020  11:54 AM  PATIENT:  Danielle Ellis  65 y.o. female  PRE-OPERATIVE DIAGNOSIS:  1. S/p NSTMEI 2.Coronary artery disease  POST-OPERATIVE DIAGNOSIS:  1. S/p NSTEMI 2. Coronary artery disease  PROCEDURE:  TRANSESOPHAGEAL ECHOCARDIOGRAM (TEE), CORONARY ARTERY BYPASS GRAFTING (CABG) X FIVE (LIMA to LAD, SVG to DIAGONAL, SVG SEQUENTIALLY to OM1 and OM2, SVG to PDA) USING LEFT INTERNAL MAMMARY ARTERY AND BILATERAL LEGS GREATER SAPHENOUS VEINS HARVESTED ENDOSCOPICALLY  EVH HARVEST TIME: 72 minutes; EVH PREP TIME:20 minutes  SURGEON:  Surgeon(s) and Role:    Bartle, Payton Doughty, MD - Primary  PHYSICIAN ASSISTANT: Doree Fudge PA-C  ANESTHESIA:   general  EBL:  Per perfusion and anesthesia record  DRAINS: Chest tubes placed in the mediastinal and pleural spaces   COUNTS CORRECT:  YES   DICTATION: .Dragon Dictation  PLAN OF CARE: Admit to inpatient   PATIENT DISPOSITION:  ICU - intubated and hemodynamically stable.   Delay start of Pharmacological VTE agent (>24hrs) due to surgical blood loss or risk of bleeding: yes  BASELINE WEIGHT: 93 kg

## 2020-10-15 ENCOUNTER — Ambulatory Visit (HOSPITAL_COMMUNITY): Admission: RE | Admit: 2020-10-15 | Payer: Medicare Other | Source: Ambulatory Visit

## 2020-10-15 ENCOUNTER — Encounter (HOSPITAL_COMMUNITY): Payer: Self-pay | Admitting: Surgery

## 2020-10-15 ENCOUNTER — Inpatient Hospital Stay (HOSPITAL_COMMUNITY): Payer: Medicare Other

## 2020-10-15 LAB — CBC
HCT: 29 % — ABNORMAL LOW (ref 36.0–46.0)
HCT: 33.2 % — ABNORMAL LOW (ref 36.0–46.0)
Hemoglobin: 10.3 g/dL — ABNORMAL LOW (ref 12.0–15.0)
Hemoglobin: 9.7 g/dL — ABNORMAL LOW (ref 12.0–15.0)
MCH: 28.7 pg (ref 26.0–34.0)
MCH: 29.6 pg (ref 26.0–34.0)
MCHC: 31 g/dL (ref 30.0–36.0)
MCHC: 33.4 g/dL (ref 30.0–36.0)
MCV: 88.4 fL (ref 80.0–100.0)
MCV: 92.5 fL (ref 80.0–100.0)
Platelets: 156 10*3/uL (ref 150–400)
Platelets: 160 10*3/uL (ref 150–400)
RBC: 3.28 MIL/uL — ABNORMAL LOW (ref 3.87–5.11)
RBC: 3.59 MIL/uL — ABNORMAL LOW (ref 3.87–5.11)
RDW: 13.4 % (ref 11.5–15.5)
RDW: 13.9 % (ref 11.5–15.5)
WBC: 9.5 10*3/uL (ref 4.0–10.5)
WBC: 9.7 10*3/uL (ref 4.0–10.5)
nRBC: 0 % (ref 0.0–0.2)
nRBC: 0 % (ref 0.0–0.2)

## 2020-10-15 LAB — BASIC METABOLIC PANEL
Anion gap: 9 (ref 5–15)
BUN: 10 mg/dL (ref 8–23)
CO2: 21 mmol/L — ABNORMAL LOW (ref 22–32)
Calcium: 8.1 mg/dL — ABNORMAL LOW (ref 8.9–10.3)
Chloride: 106 mmol/L (ref 98–111)
Creatinine, Ser: 0.79 mg/dL (ref 0.44–1.00)
GFR, Estimated: >60 mL/min (ref >60)
Glucose, Bld: 127 mg/dL — ABNORMAL HIGH (ref 70–99)
Potassium: 3.9 mmol/L (ref 3.5–5.1)
Sodium: 136 mmol/L (ref 135–145)

## 2020-10-15 LAB — MAGNESIUM: Magnesium: 2.3 mg/dL (ref 1.7–2.4)

## 2020-10-15 LAB — GLUCOSE, CAPILLARY
Glucose-Capillary: 100 mg/dL — ABNORMAL HIGH (ref 70–99)
Glucose-Capillary: 107 mg/dL — ABNORMAL HIGH (ref 70–99)
Glucose-Capillary: 116 mg/dL — ABNORMAL HIGH (ref 70–99)
Glucose-Capillary: 117 mg/dL — ABNORMAL HIGH (ref 70–99)
Glucose-Capillary: 120 mg/dL — ABNORMAL HIGH (ref 70–99)
Glucose-Capillary: 126 mg/dL — ABNORMAL HIGH (ref 70–99)
Glucose-Capillary: 127 mg/dL — ABNORMAL HIGH (ref 70–99)
Glucose-Capillary: 128 mg/dL — ABNORMAL HIGH (ref 70–99)
Glucose-Capillary: 142 mg/dL — ABNORMAL HIGH (ref 70–99)

## 2020-10-15 MED ORDER — INSULIN ASPART 100 UNIT/ML ~~LOC~~ SOLN
0.0000 [IU] | SUBCUTANEOUS | Status: DC
  Administered 2020-10-15: 2 [IU] via SUBCUTANEOUS

## 2020-10-15 MED ORDER — INSULIN DETEMIR 100 UNIT/ML ~~LOC~~ SOLN
15.0000 [IU] | Freq: Every day | SUBCUTANEOUS | Status: DC
  Administered 2020-10-16: 15 [IU] via SUBCUTANEOUS
  Filled 2020-10-15 (×2): qty 0.15

## 2020-10-15 MED ORDER — INSULIN DETEMIR 100 UNIT/ML ~~LOC~~ SOLN
15.0000 [IU] | Freq: Every day | SUBCUTANEOUS | Status: AC
  Administered 2020-10-15: 15 [IU] via SUBCUTANEOUS
  Filled 2020-10-15: qty 0.15

## 2020-10-15 MED ORDER — ENOXAPARIN SODIUM 40 MG/0.4ML ~~LOC~~ SOLN
40.0000 mg | Freq: Every day | SUBCUTANEOUS | Status: DC
  Administered 2020-10-15 – 2020-10-18 (×4): 40 mg via SUBCUTANEOUS
  Filled 2020-10-15 (×4): qty 0.4

## 2020-10-15 MED ORDER — INSULIN DETEMIR 100 UNIT/ML ~~LOC~~ SOLN
15.0000 [IU] | Freq: Every day | SUBCUTANEOUS | Status: DC
  Filled 2020-10-15: qty 0.15

## 2020-10-15 MED ORDER — FUROSEMIDE 10 MG/ML IJ SOLN
40.0000 mg | Freq: Two times a day (BID) | INTRAMUSCULAR | Status: AC
  Administered 2020-10-15 (×2): 40 mg via INTRAVENOUS
  Filled 2020-10-15 (×2): qty 4

## 2020-10-15 MED ORDER — VALACYCLOVIR HCL 500 MG PO TABS
2000.0000 mg | ORAL_TABLET | Freq: Two times a day (BID) | ORAL | Status: DC | PRN
  Filled 2020-10-15: qty 4

## 2020-10-15 MED ORDER — POTASSIUM CHLORIDE CRYS ER 20 MEQ PO TBCR
20.0000 meq | EXTENDED_RELEASE_TABLET | Freq: Three times a day (TID) | ORAL | Status: AC
  Administered 2020-10-15 (×3): 20 meq via ORAL
  Filled 2020-10-15 (×3): qty 1

## 2020-10-15 MED FILL — Thrombin (Recombinant) For Soln 20000 Unit: CUTANEOUS | Qty: 1 | Status: AC

## 2020-10-15 MED FILL — Heparin Sodium (Porcine) Inj 1000 Unit/ML: INTRAMUSCULAR | Qty: 30 | Status: AC

## 2020-10-15 MED FILL — Magnesium Sulfate Inj 50%: INTRAMUSCULAR | Qty: 10 | Status: AC

## 2020-10-15 MED FILL — Potassium Chloride Inj 2 mEq/ML: INTRAVENOUS | Qty: 40 | Status: AC

## 2020-10-15 NOTE — Progress Notes (Signed)
   Awake.  Doing well.  No A. fib.

## 2020-10-15 NOTE — Progress Notes (Signed)
EVENING ROUNDS NOTE :     [address]       [place] [address]             [phone]                 1 Day Post-Op Procedure(s) (LRB): CORONARY ARTERY BYPASS GRAFTING (CABG) X FIVE, USING LEFT INTERNAL MAMMARY ARTERY AND BILATERAL LEGS GREATER SAPHENOUS VEINS HARVESTED ENDOSCOPICALLY (N/A) TRANSESOPHAGEAL ECHOCARDIOGRAM (TEE) (N/A)   Total Length of Stay:  LOS: 6 days  Events:   No events Resting comfortably     BP (!) 116/53   Pulse 82   Temp 98.4 F (36.9 C) (Oral)   Resp (!) 27   Ht 5\' 1"  (1.549 m)   Wt 99.9 kg   SpO2 92%   BMI 41.61 kg/m   PAP: (19-31)/(6-15) 30/13 CO:  [4.2 L/min-4.9 L/min] 4.2 L/min CI:  [2.2 L/min/m2-2.6 L/min/m2] 2.2 L/min/m2     . sodium chloride Stopped (10/15/20 0821)  . sodium chloride    . sodium chloride 10 mL/hr at 10/14/20 1340  . cefUROXime (ZINACEF)  IV Stopped (10/15/20 1726)  . lactated ringers 10 mL/hr at 10/15/20 1800  . lactated ringers Stopped (10/15/20 0818)  . nitroGLYCERIN Stopped (10/15/20 0519)  . phenylephrine (NEO-SYNEPHRINE) Adult infusion 0 mcg/min (10/14/20 1340)    I/O last 3 completed shifts: In: 5828.8 [P.O.:480; I.V.:3057.1; Blood:275; IV Piggyback:2016.7] Out: 5540 [Urine:4990; Chest Tube:550]   CBC Latest Ref Rng & Units 10/15/2020 10/14/2020 10/14/2020  WBC 4.0 - 10.5 K/uL 9.7 9.6 -  Hemoglobin 12.0 - 15.0 g/dL 12/14/2020) 10.0(L) 10.2(L)  Hematocrit 36.0 - 46.0 % 29.0(L) 30.9(L) 30.0(L)  Platelets 150 - 400 K/uL 156 134(L) -    BMP Latest Ref Rng & Units 10/15/2020 10/14/2020 10/14/2020  Glucose 70 - 99 mg/dL 12/14/2020) 024(O) -  BUN 8 - 23 mg/dL 10 13 -  Creatinine 973(Z - 1.00 mg/dL 3.29 9.24 -  BUN/Creat Ratio 12 - 28 - - -  Sodium 135 - 145 mmol/L 136 138 140  Potassium 3.5 - 5.1 mmol/L 3.9 4.6 5.0  Chloride 98 - 111 mmol/L 106 108 -  CO2 22 - 32 mmol/L 21(L) 20(L) -  Calcium 8.9 - 10.3 mg/dL 8.1(L) 8.3(L) -    ABG    Component Value Date/Time   PHART 7.352 10/14/2020 1817    PCO2ART 38.4 10/14/2020 1817   PO2ART 115 (H) 10/14/2020 1817   HCO3 21.3 10/14/2020 1817   TCO2 23 10/14/2020 1817   ACIDBASEDEF 4.0 (H) 10/14/2020 1817   O2SAT 98.0 10/14/2020 1817       12/14/2020, MD 10/15/2020 7:41 PM

## 2020-10-15 NOTE — Addendum Note (Signed)
Addendum  created 10/15/20 0725 by Rosalio Macadamia, CRNA   Charge Capture section accepted

## 2020-10-15 NOTE — Progress Notes (Signed)
1 Day Post-Op Procedure(s) (LRB): CORONARY ARTERY BYPASS GRAFTING (CABG) X FIVE, USING LEFT INTERNAL MAMMARY ARTERY AND BILATERAL LEGS GREATER SAPHENOUS VEINS HARVESTED ENDOSCOPICALLY (N/A) TRANSESOPHAGEAL ECHOCARDIOGRAM (TEE) (N/A) Subjective: No complaints. Slept some.  Objective: Vital signs in last 24 hours: Temp:  [96.8 F (36 C)-99.86 F (37.7 C)] 99.68 F (37.6 C) (03/02 0716) Pulse Rate:  [70-103] 81 (03/02 0716) Cardiac Rhythm: Normal sinus rhythm (03/02 0400) Resp:  [7-32] 24 (03/02 0716) BP: (95-142)/(55-79) 110/60 (03/02 0700) SpO2:  [93 %-100 %] 99 % (03/02 0716) Arterial Line BP: (98-170)/(54-85) 129/56 (03/02 0716) FiO2 (%):  [40 %-50 %] 40 % (03/01 1700) Weight:  [99.9 kg] 99.9 kg (03/02 0600)  Hemodynamic parameters for last 24 hours: PAP: (19-40)/(6-26) 29/15 CO:  [3 L/min-4.9 L/min] 4.2 L/min CI:  [1.5 L/min/m2-2.6 L/min/m2] 2.2 L/min/m2  Intake/Output from previous day: 03/01 0701 - 03/02 0700 In: 5125.4 [I.V.:2933.8; Blood:275; IV Piggyback:1916.5] Out: 3165 [Urine:2615; Chest Tube:550] Intake/Output this shift: No intake/output data recorded.  General appearance: alert and cooperative Neurologic: intact Heart: regular rate and rhythm, S1, S2 normal, no murmur, click, rub or gallop Lungs: clear to auscultation bilaterally Extremities: edema mild Wound: dressings dry  Lab Results: Recent Labs    10/14/20 2013 10/15/20 0308  WBC 9.6 9.7  HGB 10.0* 9.7*  HCT 30.9* 29.0*  PLT 134* 156   BMET:  Recent Labs    10/14/20 2013 10/15/20 0308  NA 138 136  K 4.6 3.9  CL 108 106  CO2 20* 21*  GLUCOSE 141* 127*  BUN 13 10  CREATININE 0.80 0.79  CALCIUM 8.3* 8.1*    PT/INR:  Recent Labs    10/14/20 1356  LABPROT 15.4*  INR 1.3*   ABG    Component Value Date/Time   PHART 7.352 10/14/2020 1817   HCO3 21.3 10/14/2020 1817   TCO2 23 10/14/2020 1817   ACIDBASEDEF 4.0 (H) 10/14/2020 1817   O2SAT 98.0 10/14/2020 1817   CBG (last 3)   Recent Labs    10/15/20 0307 10/15/20 0516 10/15/20 0708  GLUCAP 127* 120* 107*   CXR: ok  ECG: sinus, no acute changes.  Assessment/Plan: S/P Procedure(s) (LRB): CORONARY ARTERY BYPASS GRAFTING (CABG) X FIVE, USING LEFT INTERNAL MAMMARY ARTERY AND BILATERAL LEGS GREATER SAPHENOUS VEINS HARVESTED ENDOSCOPICALLY (N/A) TRANSESOPHAGEAL ECHOCARDIOGRAM (TEE) (N/A)  POD 1 Hemodynamically stable in sinus rhythm. Continue Lopressor.  Volume excess: wt is 13.5 lbs over preop. Start diuresis.  DM: preop Hgb A1c was 6.2 on Metformin. Start Levemir and SSI and stop drip.  DC chest tubes, swan, arterial line.  IS, OOB, ambulate.   LOS: 6 days    Alleen Borne 10/15/2020

## 2020-10-15 NOTE — Plan of Care (Signed)
  Problem: Health Behavior/Discharge Planning: Goal: Ability to manage health-related needs will improve Outcome: Progressing   Problem: Clinical Measurements: Goal: Diagnostic test results will improve Outcome: Progressing Goal: Cardiovascular complication will be avoided Outcome: Progressing   Problem: Coping: Goal: Level of anxiety will decrease Outcome: Progressing   Problem: Elimination: Goal: Will not experience complications related to bowel motility Outcome: Progressing Goal: Will not experience complications related to urinary retention Outcome: Progressing   Problem: Pain Managment: Goal: General experience of comfort will improve Outcome: Progressing   Problem: Safety: Goal: Ability to remain free from injury will improve Outcome: Progressing   Problem: Skin Integrity: Goal: Risk for impaired skin integrity will decrease Outcome: Progressing   Problem: Education: Goal: Understanding of cardiac disease, CV risk reduction, and recovery process will improve Outcome: Progressing Goal: Understanding of medication regimen will improve Outcome: Progressing Goal: Individualized Educational Video(s) Outcome: Progressing   Problem: Activity: Goal: Ability to tolerate increased activity will improve Outcome: Progressing   Problem: Cardiac: Goal: Ability to achieve and maintain adequate cardiopulmonary perfusion will improve Outcome: Progressing Goal: Vascular access site(s) Level 0-1 will be maintained Outcome: Progressing   Problem: Health Behavior/Discharge Planning: Goal: Ability to safely manage health-related needs after discharge will improve Outcome: Progressing   Problem: Education: Goal: Will demonstrate proper wound care and an understanding of methods to prevent future damage Outcome: Progressing Goal: Knowledge of disease or condition will improve Outcome: Progressing Goal: Knowledge of the prescribed therapeutic regimen will improve Outcome:  Progressing Goal: Individualized Educational Video(s) Outcome: Progressing   Problem: Activity: Goal: Risk for activity intolerance will decrease Outcome: Progressing   Problem: Cardiac: Goal: Will achieve and/or maintain hemodynamic stability Outcome: Progressing   Problem: Clinical Measurements: Goal: Postoperative complications will be avoided or minimized Outcome: Progressing   Problem: Respiratory: Goal: Respiratory status will improve Outcome: Progressing   Problem: Skin Integrity: Goal: Wound healing without signs and symptoms of infection Outcome: Progressing Goal: Risk for impaired skin integrity will decrease Outcome: Progressing   Problem: Urinary Elimination: Goal: Ability to achieve and maintain adequate renal perfusion and functioning will improve Outcome: Progressing

## 2020-10-16 ENCOUNTER — Inpatient Hospital Stay (HOSPITAL_COMMUNITY): Payer: Medicare Other

## 2020-10-16 DIAGNOSIS — Z951 Presence of aortocoronary bypass graft: Secondary | ICD-10-CM

## 2020-10-16 LAB — BASIC METABOLIC PANEL
Anion gap: 10 (ref 5–15)
Anion gap: 6 (ref 5–15)
BUN: 11 mg/dL (ref 8–23)
BUN: 12 mg/dL (ref 8–23)
CO2: 21 mmol/L — ABNORMAL LOW (ref 22–32)
CO2: 25 mmol/L (ref 22–32)
Calcium: 7.5 mg/dL — ABNORMAL LOW (ref 8.9–10.3)
Calcium: 8.5 mg/dL — ABNORMAL LOW (ref 8.9–10.3)
Chloride: 100 mmol/L (ref 98–111)
Chloride: 103 mmol/L (ref 98–111)
Creatinine, Ser: 0.85 mg/dL (ref 0.44–1.00)
Creatinine, Ser: 0.91 mg/dL (ref 0.44–1.00)
GFR, Estimated: >60 mL/min (ref >60)
GFR, Estimated: >60 mL/min (ref >60)
Glucose, Bld: 105 mg/dL — ABNORMAL HIGH (ref 70–99)
Glucose, Bld: 106 mg/dL — ABNORMAL HIGH (ref 70–99)
Potassium: 3.3 mmol/L — ABNORMAL LOW (ref 3.5–5.1)
Potassium: 4.2 mmol/L (ref 3.5–5.1)
Sodium: 131 mmol/L — ABNORMAL LOW (ref 135–145)
Sodium: 134 mmol/L — ABNORMAL LOW (ref 135–145)

## 2020-10-16 LAB — CBC
HCT: 30.1 % — ABNORMAL LOW (ref 36.0–46.0)
Hemoglobin: 10 g/dL — ABNORMAL LOW (ref 12.0–15.0)
MCH: 29.9 pg (ref 26.0–34.0)
MCHC: 33.2 g/dL (ref 30.0–36.0)
MCV: 90.1 fL (ref 80.0–100.0)
Platelets: 173 10*3/uL (ref 150–400)
RBC: 3.34 MIL/uL — ABNORMAL LOW (ref 3.87–5.11)
RDW: 13.9 % (ref 11.5–15.5)
WBC: 9.3 10*3/uL (ref 4.0–10.5)
nRBC: 0 % (ref 0.0–0.2)

## 2020-10-16 LAB — GLUCOSE, CAPILLARY
Glucose-Capillary: 111 mg/dL — ABNORMAL HIGH (ref 70–99)
Glucose-Capillary: 137 mg/dL — ABNORMAL HIGH (ref 70–99)
Glucose-Capillary: 144 mg/dL — ABNORMAL HIGH (ref 70–99)
Glucose-Capillary: 93 mg/dL (ref 70–99)
Glucose-Capillary: 95 mg/dL (ref 70–99)
Glucose-Capillary: 98 mg/dL (ref 70–99)

## 2020-10-16 LAB — MAGNESIUM: Magnesium: 2.3 mg/dL (ref 1.7–2.4)

## 2020-10-16 MED ORDER — FUROSEMIDE 10 MG/ML IJ SOLN
40.0000 mg | Freq: Once | INTRAMUSCULAR | Status: AC
  Administered 2020-10-16: 40 mg via INTRAVENOUS
  Filled 2020-10-16: qty 4

## 2020-10-16 MED ORDER — FUROSEMIDE 40 MG PO TABS: 40.0000 mg | ORAL_TABLET | Freq: Every day | ORAL | Status: DC

## 2020-10-16 MED ORDER — CHLORHEXIDINE GLUCONATE CLOTH 2 % EX PADS
6.0000 | MEDICATED_PAD | Freq: Every day | CUTANEOUS | Status: DC
  Administered 2020-10-16 – 2020-10-19 (×4): 6 via TOPICAL

## 2020-10-16 MED ORDER — SODIUM CHLORIDE 0.9% FLUSH: 3.0000 mL | INTRAVENOUS | Status: DC | PRN

## 2020-10-16 MED ORDER — ~~LOC~~ CARDIAC SURGERY, PATIENT & FAMILY EDUCATION: Freq: Once | Status: AC

## 2020-10-16 MED ORDER — SENNOSIDES-DOCUSATE SODIUM 8.6-50 MG PO TABS
1.0000 | ORAL_TABLET | Freq: Two times a day (BID) | ORAL | Status: DC | PRN
  Administered 2020-10-18: 1 via ORAL
  Filled 2020-10-16: qty 1

## 2020-10-16 MED ORDER — INSULIN ASPART 100 UNIT/ML ~~LOC~~ SOLN
0.0000 [IU] | Freq: Three times a day (TID) | SUBCUTANEOUS | Status: DC
  Administered 2020-10-16: 2 [IU] via SUBCUTANEOUS

## 2020-10-16 MED ORDER — OXYCODONE HCL 5 MG PO TABS
5.0000 mg | ORAL_TABLET | ORAL | Status: DC | PRN
  Administered 2020-10-16 – 2020-10-17 (×3): 10 mg via ORAL
  Administered 2020-10-17: 5 mg via ORAL
  Administered 2020-10-17: 10 mg via ORAL
  Administered 2020-10-17: 5 mg via ORAL
  Administered 2020-10-18 – 2020-10-19 (×5): 10 mg via ORAL
  Filled 2020-10-16 (×4): qty 2
  Filled 2020-10-16 (×2): qty 1
  Filled 2020-10-16: qty 2
  Filled 2020-10-16 (×2): qty 1
  Filled 2020-10-16: qty 2
  Filled 2020-10-16: qty 1
  Filled 2020-10-16 (×2): qty 2

## 2020-10-16 MED ORDER — POTASSIUM CHLORIDE CRYS ER 20 MEQ PO TBCR
20.0000 meq | EXTENDED_RELEASE_TABLET | Freq: Two times a day (BID) | ORAL | Status: DC
  Administered 2020-10-17 – 2020-10-19 (×5): 20 meq via ORAL
  Filled 2020-10-16 (×5): qty 1

## 2020-10-16 MED ORDER — SODIUM CHLORIDE 0.9% FLUSH
3.0000 mL | Freq: Two times a day (BID) | INTRAVENOUS | Status: DC
  Administered 2020-10-16 – 2020-10-19 (×7): 3 mL via INTRAVENOUS

## 2020-10-16 MED ORDER — METOPROLOL TARTRATE 25 MG PO TABS: 25.0000 mg | ORAL_TABLET | Freq: Two times a day (BID) | ORAL | Status: DC

## 2020-10-16 MED ORDER — ONDANSETRON HCL 4 MG/2ML IJ SOLN
4.0000 mg | Freq: Four times a day (QID) | INTRAMUSCULAR | Status: DC | PRN
  Filled 2020-10-16: qty 2

## 2020-10-16 MED ORDER — TRAMADOL HCL 50 MG PO TABS
50.0000 mg | ORAL_TABLET | ORAL | Status: DC | PRN
  Administered 2020-10-17: 50 mg via ORAL
  Filled 2020-10-16: qty 1

## 2020-10-16 MED ORDER — INSULIN ASPART 100 UNIT/ML ~~LOC~~ SOLN: 0.0000 [IU] | Freq: Three times a day (TID) | SUBCUTANEOUS | Status: DC

## 2020-10-16 MED ORDER — ASPIRIN EC 325 MG PO TBEC
325.0000 mg | DELAYED_RELEASE_TABLET | Freq: Every day | ORAL | Status: DC
  Administered 2020-10-16: 325 mg via ORAL
  Filled 2020-10-16: qty 1

## 2020-10-16 MED ORDER — METOPROLOL TARTRATE 25 MG PO TABS
25.0000 mg | ORAL_TABLET | Freq: Two times a day (BID) | ORAL | Status: DC
  Administered 2020-10-16 – 2020-10-19 (×7): 25 mg via ORAL
  Filled 2020-10-16 (×7): qty 1

## 2020-10-16 MED ORDER — PANTOPRAZOLE SODIUM 40 MG PO TBEC
40.0000 mg | DELAYED_RELEASE_TABLET | Freq: Every day | ORAL | Status: DC
  Administered 2020-10-16 – 2020-10-19 (×4): 40 mg via ORAL
  Filled 2020-10-16 (×4): qty 1

## 2020-10-16 MED ORDER — ACETAMINOPHEN 325 MG PO TABS: 650.0000 mg | ORAL_TABLET | Freq: Four times a day (QID) | ORAL | Status: DC | PRN

## 2020-10-16 MED ORDER — POTASSIUM CHLORIDE CRYS ER 20 MEQ PO TBCR
40.0000 meq | EXTENDED_RELEASE_TABLET | Freq: Once | ORAL | Status: AC
  Administered 2020-10-16: 40 meq via ORAL
  Filled 2020-10-16: qty 2

## 2020-10-16 MED ORDER — METOPROLOL TARTRATE 25 MG/10 ML ORAL SUSPENSION: 25.0000 mg | Freq: Two times a day (BID) | ORAL | Status: DC

## 2020-10-16 MED ORDER — SODIUM CHLORIDE 0.9 % IV SOLN: 250.0000 mL | INTRAVENOUS | Status: DC | PRN

## 2020-10-16 MED FILL — Lidocaine HCl Local Soln Prefilled Syringe 100 MG/5ML (2%): INTRAMUSCULAR | Qty: 5 | Status: AC

## 2020-10-16 MED FILL — Mannitol IV Soln 20%: INTRAVENOUS | Qty: 500 | Status: AC

## 2020-10-16 MED FILL — Electrolyte-R (PH 7.4) Solution: INTRAVENOUS | Qty: 4000 | Status: AC

## 2020-10-16 MED FILL — Heparin Sodium (Porcine) Inj 1000 Unit/ML: INTRAMUSCULAR | Qty: 10 | Status: AC

## 2020-10-16 MED FILL — Sodium Chloride IV Soln 0.9%: INTRAVENOUS | Qty: 3000 | Status: AC

## 2020-10-16 MED FILL — Albumin, Human Inj 5%: INTRAVENOUS | Qty: 250 | Status: AC

## 2020-10-16 MED FILL — Sodium Bicarbonate IV Soln 8.4%: INTRAVENOUS | Qty: 50 | Status: AC

## 2020-10-16 NOTE — Progress Notes (Signed)
Pt arrived to 4e from 2h. Pt oriented to room and staff. Vitals obtained. Telemetry applied and CCMD notified. Family at bedside. Will continue current plan of care.

## 2020-10-16 NOTE — Progress Notes (Signed)
   Sitting in chair.  Better pain control today.  Had some sleep overnight.  No atrial fibrillation.  Stable.

## 2020-10-16 NOTE — Progress Notes (Signed)
2 Days Post-Op Procedure(s) (LRB): CORONARY ARTERY BYPASS GRAFTING (CABG) X FIVE, USING LEFT INTERNAL MAMMARY ARTERY AND BILATERAL LEGS GREATER SAPHENOUS VEINS HARVESTED ENDOSCOPICALLY (N/A) TRANSESOPHAGEAL ECHOCARDIOGRAM (TEE) (N/A) Subjective: Feels well. Slept well, ambulated around the ICU.  Objective: Vital signs in last 24 hours: Temp:  [97.9 F (36.6 C)-99.6 F (37.6 C)] 97.9 F (36.6 C) (03/03 0400) Pulse Rate:  [74-93] 93 (03/03 0700) Cardiac Rhythm: Normal sinus rhythm (03/03 0400) Resp:  [11-35] 24 (03/03 0700) BP: (108-133)/(53-106) 121/106 (03/03 0700) SpO2:  [89 %-99 %] 94 % (03/03 0700) Arterial Line BP: (130)/(57) 130/57 (03/02 0800) Weight:  [97.5 kg] 97.5 kg (03/03 0500)  Hemodynamic parameters for last 24 hours: PAP: (30)/(13) 30/13  Intake/Output from previous day: 03/02 0701 - 03/03 0700 In: 1283.5 [P.O.:960; I.V.:123.3; IV Piggyback:200.2] Out: 2700 [Urine:2700] Intake/Output this shift: No intake/output data recorded.  General appearance: alert and cooperative Neurologic: intact Heart: regular rate and rhythm Lungs: clear to auscultation bilaterally Extremities: edema mild Wound: dressings dry  Lab Results: Recent Labs    10/15/20 2334 10/16/20 0248  WBC 9.5 9.3  HGB 10.3* 10.0*  HCT 33.2* 30.1*  PLT 160 173   BMET:  Recent Labs    10/15/20 2334 10/16/20 0248  NA 131* 134*  K 3.3* 4.2  CL 100 103  CO2 21* 25  GLUCOSE 105* 106*  BUN 11 12  CREATININE 0.85 0.91  CALCIUM 7.5* 8.5*    PT/INR:  Recent Labs    10/14/20 1356  LABPROT 15.4*  INR 1.3*   ABG    Component Value Date/Time   PHART 7.352 10/14/2020 1817   HCO3 21.3 10/14/2020 1817   TCO2 23 10/14/2020 1817   ACIDBASEDEF 4.0 (H) 10/14/2020 1817   O2SAT 98.0 10/14/2020 1817   CBG (last 3)  Recent Labs    10/15/20 2026 10/16/20 0010 10/16/20 0342  GLUCAP 100* 111* 93   CXR pending  Assessment/Plan: S/P Procedure(s) (LRB): CORONARY ARTERY BYPASS GRAFTING  (CABG) X FIVE, USING LEFT INTERNAL MAMMARY ARTERY AND BILATERAL LEGS GREATER SAPHENOUS VEINS HARVESTED ENDOSCOPICALLY (N/A) TRANSESOPHAGEAL ECHOCARDIOGRAM (TEE) (N/A)  POD 2 Hemodynamically stable in sinus rhythm. Will increase Lopressor to 25 bid.  Volume excess: improving with diuresis. -1400 cc yesterday and wt down 5 lbs. Still 8 lbs over preop. Will give IV lasix this am and switch to oral lasix daily. Replace K+  DM: good control on Levemir and SSI.  DC sleeve and foley.  Continue IS, ambulation.  Transfer to 4E.   LOS: 7 days    Alleen Borne 10/16/2020

## 2020-10-16 NOTE — Progress Notes (Signed)
Mobility Specialist - Progress Note   10/16/20 1336  Mobility  Activity Ambulated in hall  Level of Assistance Minimal assist, patient does 75% or more  Assistive Device Front wheel walker  Distance Ambulated (ft) 340 ft  Mobility Response Tolerated well  Mobility performed by Mobility specialist  $Mobility charge 1 Mobility   Pre-mobility: 80 HR, 121/76 BP Post-mobility: 90 HR  Pt min assist to rock and stand from recliner, adhered to sternal precautions. She c/o sternal incision soreness while ambulating, but was otherwise asx. Pt to bathroom after walk, daughter in room.   Mamie Levers Mobility Specialist Mobility Specialist Phone: [phone]

## 2020-10-16 NOTE — Plan of Care (Signed)
  Problem: Coping: Goal: Level of anxiety will decrease Outcome: Not Progressing   

## 2020-10-16 NOTE — Progress Notes (Signed)
Report called to Lauren,RN at 4E12. Patient resting comfortably. VSS. Transported to new room via wheelchair with husband at bedside.

## 2020-10-16 NOTE — TOC Initial Note (Signed)
Transition of Care [facility]) - Initial/Assessment Note    Patient Details  Name: Danielle Ellis MRN: [id number] Date of Birth: 04-04-1956  [facility] CM/SW Contact:    Gala Lewandowsky, RN Phone Number: 10/16/2020, 12:24 PM  Clinical Narrative: Patient presented for chest pain-Nstemi-post CABG. POD-2 CABG. Prior to arrival patient was independent from home with the support of her spouse. Patient states that she has a daughter that is a nurse that will assist her once she gets home along with her husband. Case Manager will continue to follow for additional transition of care needs.                  Expected Discharge Plan: Home/Self Care Barriers to Discharge: Continued Medical Work up   Patient Goals and CMS Choice Patient states their goals for this hospitalization and ongoing recovery are:: to return home once stable.   Choice offered to / list presented to : NA  Expected Discharge Plan and Services Expected Discharge Plan: Home/Self Care In-house Referral: NA Discharge Planning Services: CM Consult Post Acute Care Choice: NA                   DME Arranged: N/A HH Arranged: NA    Prior Living Arrangements/Services   Lives with:: Spouse,Self Patient language and need for interpreter reviewed:: Yes        Need for Family Participation in Patient Care: Yes (Comment) Care giver support system in place?: Yes (comment)   Criminal Activity/Legal Involvement Pertinent to Current Situation/Hospitalization: No - Comment as needed  Activities of Daily Living Home Assistive Devices/Equipment: Eyeglasses,CBG Meter,Dentures (specify type),Cane (specify quad or straight) ADL Screening (condition at time of admission) Patient's cognitive ability adequate to safely complete daily activities?: Yes Is the patient deaf or have difficulty hearing?: No Does the patient have difficulty seeing, even when wearing glasses/contacts?: No Does the patient have difficulty  concentrating, remembering, or making decisions?: No Patient able to express need for assistance with ADLs?: Yes Does the patient have difficulty dressing or bathing?: No Independently performs ADLs?: Yes (appropriate for developmental age) Does the patient have difficulty walking or climbing stairs?: No Weakness of Legs: None Weakness of Arms/Hands: None  Permission Sought/Granted Permission sought to share information with : Family Microbiologist    Emotional Assessment Appearance:: Appears stated age Attitude/Demeanor/Rapport: Engaged Affect (typically observed): Appropriate Orientation: : Oriented to  Time,Oriented to Situation,Oriented to Place,Oriented to Self Alcohol / Substance Use: Not Applicable Psych Involvement: No (comment)  Admission diagnosis:  NSTEMI (non-ST elevated myocardial infarction) (HCC) [I21.4] Coronary artery disease [I25.10] Patient Active Problem List   Diagnosis Date Noted  . S/P CABG x 5 10/14/2020  . Coronary artery disease 10/14/2020  . Type 2 diabetes mellitus without complication, without long-term current use of insulin (HCC)   . NSTEMI (non-ST elevated myocardial infarction) (HCC) 10/09/2020  . Chest pain 10/08/2020  . Hyperlipemia 03/12/2020  . Depression, major, single episode, mild (HCC) 03/12/2020  . GAD (generalized anxiety disorder) 03/12/2020  . Oral herpes simplex infection 03/12/2020  . Hypertension 04/23/2019  . Flexural eczema 04/23/2019  . Prediabetes 04/23/2019  . Allergic rhinitis due to pollen 04/23/2019   PCP:  Junie Spencer, FNP Pharmacy:   [facility] [phone] - [place], [place] - [address] [facility] [address] [place] [address] Phone: [phone] Fax: [phone]  [facility] - [place], [place] - [address] [place], [address] [employer]  [address], [address] [place] [address] Phone: [phone] Fax: [phone]   Readmission Risk Interventions No flowsheet  data found.

## 2020-10-16 NOTE — Plan of Care (Signed)
  Problem: Health Behavior/Discharge Planning: Goal: Ability to manage health-related needs will improve Outcome: Progressing   Problem: Clinical Measurements: Goal: Diagnostic test results will improve Outcome: Progressing Goal: Cardiovascular complication will be avoided Outcome: Progressing   Problem: Elimination: Goal: Will not experience complications related to bowel motility Outcome: Progressing Goal: Will not experience complications related to urinary retention Outcome: Progressing   Problem: Pain Managment: Goal: General experience of comfort will improve Outcome: Progressing   Problem: Safety: Goal: Ability to remain free from injury will improve Outcome: Progressing   Problem: Skin Integrity: Goal: Risk for impaired skin integrity will decrease Outcome: Progressing   Problem: Activity: Goal: Ability to tolerate increased activity will improve Outcome: Progressing   Problem: Cardiac: Goal: Ability to achieve and maintain adequate cardiopulmonary perfusion will improve Outcome: Progressing Goal: Vascular access site(s) Level 0-1 will be maintained Outcome: Progressing   Problem: Health Behavior/Discharge Planning: Goal: Ability to safely manage health-related needs after discharge will improve Outcome: Progressing   Problem: Activity: Goal: Risk for activity intolerance will decrease Outcome: Progressing   Problem: Cardiac: Goal: Will achieve and/or maintain hemodynamic stability Outcome: Progressing   Problem: Clinical Measurements: Goal: Postoperative complications will be avoided or minimized Outcome: Progressing   Problem: Respiratory: Goal: Respiratory status will improve Outcome: Progressing   Problem: Skin Integrity: Goal: Wound healing without signs and symptoms of infection Outcome: Progressing Goal: Risk for impaired skin integrity will decrease Outcome: Progressing

## 2020-10-16 NOTE — Discharge Instructions (Signed)
Discharge Instructions:  1. You may shower, please wash incisions daily with soap and water and keep dry.  If you wish to cover wounds with dressing you may do so but please keep clean and change daily.  No tub baths or swimming until incisions have completely healed.  If your incisions become red or develop any drainage please call our office at [phone]  2. No Driving until cleared by Dr. Sharee Pimple office and you are no longer using narcotic pain medications  3. Monitor your weight daily.. Please use the same scale and weigh at same time... If you gain 5-10 lbs in 48 hours with associated lower extremity swelling, please contact our office at [phone]  4. Fever of 101.5 for at least 24 hours with no source, please contact our office at [phone]   Prediabetes Eating Plan Prediabetes is a condition that causes blood sugar (glucose) levels to be higher than normal. This increases the risk for developing type 2 diabetes (type 2 diabetes mellitus). Working with a health care provider or nutrition specialist (dietitian) to make diet and lifestyle changes can help prevent the onset of diabetes. These changes may help you:  Control your blood glucose levels.  Improve your cholesterol levels.  Manage your blood pressure. What are tips for following this plan? Reading food labels  Read food labels to check the amount of fat, salt (sodium), and sugar in prepackaged foods. Avoid foods that have: ? Saturated fats. ? Trans fats. ? Added sugars.  Avoid foods that have more than 300 milligrams (mg) of sodium per serving. Limit your sodium intake to less than 2,300 mg each day. Shopping  Avoid buying pre-made and processed foods.  Avoid buying drinks with added sugar. Cooking  Cook with olive oil. Do not use butter, lard, or ghee.  Bake, broil, grill, steam, or boil foods. Avoid frying. Meal planning  Work with your dietitian to create an eating plan that is right for you. This  may include tracking how many calories you take in each day. Use a food diary, notebook, or mobile application to track what you eat at each meal.  Consider following a Mediterranean diet. This includes: ? Eating several servings of fresh fruits and vegetables each day. ? Eating fish at least twice a week. ? Eating one serving each day of whole grains, beans, nuts, and seeds. ? Using olive oil instead of other fats. ? Limiting alcohol. ? Limiting red meat. ? Using nonfat or low-fat dairy products.  Consider following a plant-based diet. This includes dietary choices that focus on eating mostly vegetables and fruit, grains, beans, nuts, and seeds.  If you have high blood pressure, you may need to limit your sodium intake or follow a diet such as the DASH (Dietary Approaches to Stop Hypertension) eating plan. The DASH diet aims to lower high blood pressure.   Lifestyle  Set weight loss goals with help from your health care team. It is recommended that most people with prediabetes lose 7% of their body weight.  Exercise for at least 30 minutes 5 or more days a week.  Attend a support group or seek support from a mental health counselor.  Take over-the-counter and prescription medicines only as told by your health care provider. What foods are recommended? Fruits Berries. Bananas. Apples. Oranges. Grapes. Papaya. Mango. Pomegranate. Kiwi. Grapefruit. Cherries. Vegetables Lettuce. Spinach. Peas. Beets. Cauliflower. Cabbage. Broccoli. Carrots. Tomatoes. Squash. Eggplant. Herbs. Peppers. Onions. Cucumbers. Brussels sprouts. Grains Whole grains, such as whole-wheat or whole-grain breads,  crackers, cereals, and pasta. Unsweetened oatmeal. Bulgur. Barley. Quinoa. Brown rice. Corn or whole-wheat flour tortillas or taco shells. Meats and other proteins Seafood. Poultry without skin. Lean cuts of pork and beef. Tofu. Eggs. Nuts. Beans. Dairy Low-fat or fat-free dairy products, such as yogurt,  cottage cheese, and cheese. Beverages Water. Tea. Coffee. Sugar-free or diet soda. Seltzer water. Low-fat or nonfat milk. Milk alternatives, such as soy or almond milk. Fats and oils Olive oil. Canola oil. Sunflower oil. Grapeseed oil. Avocado. Walnuts. Sweets and desserts Sugar-free or low-fat pudding. Sugar-free or low-fat ice cream and other frozen treats. Seasonings and condiments Herbs. Sodium-free spices. Mustard. Relish. Low-salt, low-sugar ketchup. Low-salt, low-sugar barbecue sauce. Low-fat or fat-free mayonnaise. The items listed above may not be a complete list of recommended foods and beverages. Contact a dietitian for more information. What foods are not recommended? Fruits Fruits canned with syrup. Vegetables Canned vegetables. Frozen vegetables with butter or cream sauce. Grains Refined white flour and flour products, such as bread, pasta, snack foods, and cereals. Meats and other proteins Fatty cuts of meat. Poultry with skin. Breaded or fried meat. Processed meats. Dairy Full-fat yogurt, cheese, or milk. Beverages Sweetened drinks, such as iced tea and soda. Fats and oils Butter. Lard. Ghee. Sweets and desserts Baked goods, such as cake, cupcakes, pastries, cookies, and cheesecake. Seasonings and condiments Spice mixes with added salt. Ketchup. Barbecue sauce. Mayonnaise. The items listed above may not be a complete list of foods and beverages that are not recommended. Contact a dietitian for more information. Where to find more information  American Diabetes Association: www.diabetes.org Summary  You may need to make diet and lifestyle changes to help prevent the onset of diabetes. These changes can help you control blood sugar, improve cholesterol levels, and manage blood pressure.  Set weight loss goals with help from your health care team. It is recommended that most people with prediabetes lose 7% of their body weight.  Consider following a Mediterranean  diet. This includes eating plenty of fresh fruits and vegetables, whole grains, beans, nuts, seeds, fish, and low-fat dairy, and using olive oil instead of other fats. This information is not intended to replace advice given to you by your health care provider. Make sure you discuss any questions you have with your health care provider. Document Revised: 11/01/2019 Document Reviewed: 11/01/2019 Elsevier Patient Education  2021 Elsevier Inc.   5. Activity- up as tolerated, please walk at least 3 times per day.  Avoid strenuous activity, no lifting, pushing, or pulling with your arms over 8-10 lbs for a minimum of 6 weeks  6. If any questions or concerns arise, please do not hesitate to contact our office at [phone]

## 2020-10-17 LAB — CBC
HCT: 31.6 % — ABNORMAL LOW (ref 36.0–46.0)
Hemoglobin: 10 g/dL — ABNORMAL LOW (ref 12.0–15.0)
MCH: 28.9 pg (ref 26.0–34.0)
MCHC: 31.6 g/dL (ref 30.0–36.0)
MCV: 91.3 fL (ref 80.0–100.0)
Platelets: 165 10*3/uL (ref 150–400)
RBC: 3.46 MIL/uL — ABNORMAL LOW (ref 3.87–5.11)
RDW: 14 % (ref 11.5–15.5)
WBC: 9.4 10*3/uL (ref 4.0–10.5)
nRBC: 0 % (ref 0.0–0.2)

## 2020-10-17 LAB — BASIC METABOLIC PANEL
Anion gap: 9 (ref 5–15)
BUN: 18 mg/dL (ref 8–23)
CO2: 24 mmol/L (ref 22–32)
Calcium: 9 mg/dL (ref 8.9–10.3)
Chloride: 101 mmol/L (ref 98–111)
Creatinine, Ser: 0.89 mg/dL (ref 0.44–1.00)
GFR, Estimated: >60 mL/min (ref >60)
Glucose, Bld: 95 mg/dL (ref 70–99)
Potassium: 4.1 mmol/L (ref 3.5–5.1)
Sodium: 134 mmol/L — ABNORMAL LOW (ref 135–145)

## 2020-10-17 LAB — GLUCOSE, CAPILLARY
Glucose-Capillary: 84 mg/dL (ref 70–99)
Glucose-Capillary: 103 mg/dL — ABNORMAL HIGH (ref 70–99)
Glucose-Capillary: 102 mg/dL — ABNORMAL HIGH (ref 70–99)
Glucose-Capillary: 94 mg/dL (ref 70–99)

## 2020-10-17 MED ORDER — FUROSEMIDE 10 MG/ML IJ SOLN
40.0000 mg | Freq: Once | INTRAMUSCULAR | Status: AC
  Administered 2020-10-17: 40 mg via INTRAVENOUS
  Filled 2020-10-17: qty 4

## 2020-10-17 MED ORDER — FUROSEMIDE 40 MG PO TABS
40.0000 mg | ORAL_TABLET | Freq: Every day | ORAL | Status: DC
  Administered 2020-10-18 – 2020-10-19 (×2): 40 mg via ORAL
  Filled 2020-10-17 (×2): qty 1

## 2020-10-17 MED ORDER — INSULIN ASPART 100 UNIT/ML ~~LOC~~ SOLN
0.0000 [IU] | Freq: Three times a day (TID) | SUBCUTANEOUS | Status: DC
  Administered 2020-10-18: 2 [IU] via SUBCUTANEOUS

## 2020-10-17 MED ORDER — METFORMIN HCL 500 MG PO TABS
500.0000 mg | ORAL_TABLET | Freq: Every day | ORAL | Status: DC
  Administered 2020-10-17 – 2020-10-19 (×3): 500 mg via ORAL
  Filled 2020-10-17 (×3): qty 1

## 2020-10-17 MED ORDER — ASPIRIN EC 81 MG PO TBEC
81.0000 mg | DELAYED_RELEASE_TABLET | Freq: Every day | ORAL | Status: DC
  Administered 2020-10-17 – 2020-10-19 (×3): 81 mg via ORAL
  Filled 2020-10-17 (×3): qty 1

## 2020-10-17 MED ORDER — CLOPIDOGREL BISULFATE 75 MG PO TABS
75.0000 mg | ORAL_TABLET | Freq: Every day | ORAL | Status: DC
  Administered 2020-10-17 – 2020-10-19 (×3): 75 mg via ORAL
  Filled 2020-10-17 (×3): qty 1

## 2020-10-17 NOTE — Progress Notes (Signed)
Mobility Specialist - Progress Note   10/17/20 1508  Mobility  Activity Ambulated in hall  Level of Assistance Standby assist, set-up cues, supervision of patient - no hands on  Assistive Device Front wheel walker  Distance Ambulated (ft) 430 ft  Mobility Response Tolerated well  Mobility performed by Mobility specialist  $Mobility charge 1 Mobility   Pre-mobility: 88 HR During mobility: 120 HR Post-mobility: 105 HR  Pt asx throughout ambulation. Says she has been ambulating w/ her daughter, who is a Engineer, civil (consulting). Pt to recliner after walk, daughter in room.   Danielle Ellis Mobility Specialist Mobility Specialist Phone: [phone]

## 2020-10-17 NOTE — Progress Notes (Signed)
Patient was on Metformin prior to admission and although there was no history of diabetes mentioned in the H/P, she is likely borderline. Therefore, will continue accu checks and restart low dose Metformin. The patient and I  discussed the importance of glycemic control.

## 2020-10-17 NOTE — Progress Notes (Signed)
Mobility Specialist - Progress Note   10/17/20 1654  Mobility  Activity Ambulated in hall  Assistive Device Front wheel walker  Mobility performed by Family member   Pt seen ambulating in hallway w/ daughter.  Mamie Levers Mobility Specialist Mobility Specialist Phone: [phone]

## 2020-10-17 NOTE — Progress Notes (Signed)
Patient's EPW's removed per MD order without difficulty.  Pt educated on bedrest and frequent BP's.  Will continue to monitor.

## 2020-10-17 NOTE — Progress Notes (Addendum)
      [address]       [place] [address]             [phone]        3 Days Post-Op Procedure(s) (LRB): CORONARY ARTERY BYPASS GRAFTING (CABG) X FIVE, USING LEFT INTERNAL MAMMARY ARTERY AND BILATERAL LEGS GREATER SAPHENOUS VEINS HARVESTED ENDOSCOPICALLY (N/A) TRANSESOPHAGEAL ECHOCARDIOGRAM (TEE) (N/A)  Subjective: Patient passing flatus but no bowel movement yet.  Objective: Vital signs in last 24 hours: Temp:  [97.8 F (36.6 C)-98.7 F (37.1 C)] 98 F (36.7 C) (03/04 0435) Pulse Rate:  [79-97] 97 (03/04 0435) Cardiac Rhythm: Normal sinus rhythm (03/03 1957) Resp:  [16-29] 20 (03/04 0435) BP: (114-134)/(58-98) 125/65 (03/04 0435) SpO2:  [92 %-99 %] 95 % (03/04 0435) Weight:  [98.3 kg] 98.3 kg (03/04 0500)  Pre op weight 93 kg Current Weight  10/17/20 98.3 kg      Intake/Output from previous day: 03/03 0701 - 03/04 0700 In: 468.2 [P.O.:360; I.V.:108.2] Out: 1275 [Urine:1275]   Physical Exam:  Cardiovascular: RRR Pulmonary: Diminished bibasilar breath sounds Abdomen: Soft, non tender, bowel sounds present. Extremities: Mild bilateral lower extremity edema. Mild ecchymosis bilateral thighs Wounds: Sternal dressing removed and proximal portion of wound has some serosanguinous drainage. No erythema or signs of infection. There is a "pin hole" opening with light serous drainage (likely fat necrosis). Bilateral LE wounds are clean and dry.  Lab Results: CBC: Recent Labs    10/16/20 0248 10/17/20 0229  WBC 9.3 9.4  HGB 10.0* 10.0*  HCT 30.1* 31.6*  PLT 173 165   BMET:  Recent Labs    10/16/20 0248 10/17/20 0229  NA 134* 134*  K 4.2 4.1  CL 103 101  CO2 25 24  GLUCOSE 106* 95  BUN 12 18  CREATININE 0.91 0.89  CALCIUM 8.5* 9.0    PT/INR:  Lab Results  Component Value Date   INR 1.3 (H) 10/14/2020   INR 1.0 10/12/2020   INR 1.0 10/10/2020   ABG:  INR: Will add last result for INR, ABG once components are confirmed Will  add last 4 CBG results once components are confirmed  Assessment/Plan:  1. CV - S/p NSTEMI. SR. On Lopressor 25 mg bid. As discussed with Dr. Laneta Simmers, will put on Plavix and decrease ec asa to 81 mg daily. 2.  Pulmonary - On room air. Will order PA/LAT CXR for am. Encourage incentive spirometer. 3. Volume Overload - On Lasix 40 mg daily but will give IV this am. 4.  Expected post op acute blood loss anemia - H and H thsi am stable at 10 and 31.6 5. CBGs 98/95/84. Pre op HGA1C 6.2. She has pre diabetes. Will stop accu checks and SS. Will need close medical follow up after discharge and will provide nutrition information with discharge paperwork.  6. Remove EPW later today 7. Regarding minor sternal drainage, no need for antibiotic at this time. Daily dressing changes to proximal sternal wound. 7. Likely home Sunday  Danielle Ellis [facility] 10/17/2020,7:11 AM   Chart reviewed, patient examined, agree with above. She has DM and was on metformin at home with Hgb A1c of 6.2. She needs to get back on metformin and pay attention to diet modification, exercise and wt loss at home.

## 2020-10-17 NOTE — Care Management Important Message (Signed)
Important Message  Patient Details  Name: Danielle Ellis MRN: [id number] Date of Birth: Dec 19, 1955   Medicare Important Message Given:  Yes     Renie Ora 10/17/2020, 10:20 AM

## 2020-10-17 NOTE — Progress Notes (Signed)
CARDIAC REHAB PHASE I   PRE:  Rate/Rhythm: 107 ST  BP:  Supine:   Sitting: 92/61  Standing:    SaO2: 95 RA  MODE:  Ambulation: 150 ft   POST:  Rate/Rhythm: 101 ST  BP:  Supine:   Sitting: 132/58  Standing:    SaO2: 94 RA  Pt tolerated exercise well with minimal assist and a front wheel [place]. Pt amb to the [facility] and then 150 ft with no CP or SOB. Education given with pt and daughter about diabetic diet, heart health diet, IS usage, sternal precautions, home exercise guidlines post op restrictions, and OHS book. Pt will be referred to cardiac rehab phase 2 [facility]. Pt and family verbalized understanding and all questions were answered. Pt left in recliner with family in room and call bell in reach   [id number]  Harrie Jeans ACSM-EP 10/17/2020 12:20 PM

## 2020-10-18 ENCOUNTER — Inpatient Hospital Stay (HOSPITAL_COMMUNITY): Payer: Medicare Other

## 2020-10-18 LAB — GLUCOSE, CAPILLARY
Glucose-Capillary: 115 mg/dL — ABNORMAL HIGH (ref 70–99)
Glucose-Capillary: 128 mg/dL — ABNORMAL HIGH (ref 70–99)
Glucose-Capillary: 94 mg/dL (ref 70–99)
Glucose-Capillary: 115 mg/dL — ABNORMAL HIGH (ref 70–99)

## 2020-10-18 NOTE — Progress Notes (Signed)
CARDIAC REHAB PHASE I   Pt seen ambulating independently in hallway with front wheel walker and spouse. Pt denies CP, SOB, or dizziness. Reinforced importance of site care and sternal precautions. Pts daughter is checking for walker at home, may need one for d/c. RN aware.  [phone] Reynold Bowen, RN BSN 10/18/2020 10:14 AM

## 2020-10-18 NOTE — Progress Notes (Signed)
Mobility Specialist: Progress Note   10/18/20 1150  Mobility  Activity Ambulated in hall  Level of Assistance Modified independent, requires aide device or extra time  Assistive Device Front wheel walker  Distance Ambulated (ft) 470 ft  Mobility Response Tolerated well  Mobility performed by Family member  Bed Position Chair   Pt seen ambulating in hallway with family member. Pt had no c/o during ambulation. Encouraged pt to walk at least 1 more time today.   [facility] Drayce Tawil Mobility Specialist Mobility Specialist Phone: [phone]

## 2020-10-18 NOTE — Progress Notes (Addendum)
      [address]       [place] [address]             [phone]        4 Days Post-Op Procedure(s) (LRB): CORONARY ARTERY BYPASS GRAFTING (CABG) X FIVE, USING LEFT INTERNAL MAMMARY ARTERY AND BILATERAL LEGS GREATER SAPHENOUS VEINS HARVESTED ENDOSCOPICALLY (N/A) TRANSESOPHAGEAL ECHOCARDIOGRAM (TEE) (N/A)  Subjective: Awake and alert, feels she is progressing. Pain controlled.  No concerns. Passing gas and had small BM.   Objective: Vital signs in last 24 hours: Temp:  [98.2 F (36.8 C)-98.6 F (37 C)] 98.2 F (36.8 C) (03/05 0741) Pulse Rate:  [88-105] 98 (03/05 0741) Cardiac Rhythm: Sinus tachycardia (03/04 2051) Resp:  [14-22] 20 (03/05 0741) BP: (107-152)/(61-81) 122/61 (03/05 0741) SpO2:  [94 %-96 %] 94 % (03/05 0741) Weight:  [98 kg] 98 kg (03/05 0459)  Pre op weight 93 kg Current Weight  10/18/20 98 kg      Intake/Output from previous day: 03/04 0701 - 03/05 0700 In: -  Out: 2100 [Urine:2100]   Physical Exam:  Cardiovascular: RRR Pulmonary: Clear but diminished bibasilar breath sounds Abdomen: Soft, non tender, bowel sounds present. Extremities: Mild bilateral lower extremity edema. Mild expected ecchymosis bilateral thighs Wounds: Sternal and bilateral LE wounds are clean and dry.  Lab Results: CBC: Recent Labs    10/16/20 0248 10/17/20 0229  WBC 9.3 9.4  HGB 10.0* 10.0*  HCT 30.1* 31.6*  PLT 173 165   BMET:  Recent Labs    10/16/20 0248 10/17/20 0229  NA 134* 134*  K 4.2 4.1  CL 103 101  CO2 25 24  GLUCOSE 106* 95  BUN 12 18  CREATININE 0.91 0.89  CALCIUM 8.5* 9.0    PT/INR:  Lab Results  Component Value Date   INR 1.3 (H) 10/14/2020   INR 1.0 10/12/2020   INR 1.0 10/10/2020   ABG:  INR: Will add last result for INR, ABG once components are confirmed Will add last 4 CBG results once components are confirmed  EXAM: CHEST - 2 VIEW  COMPARISON:  October 16, 2020  FINDINGS: No pneumothorax. The  cardiomediastinal silhouette is stable. The right lung is clear. Small effusions underlying opacity on the left. No other interval changes.  IMPRESSION: Small stable effusion with underlying opacity on the left. No other interval change or acute abnormality.   Electronically Signed   By: Gerome Sam III M.D   On: 10/18/2020 08:21   Assessment/Plan:  1. CV - S/p NSTEMI. SR. On Lopressor 25 mg bid, Plavix and  ec asa to 81 mg daily. Rhythm stable, PW removed yesterday.  2.  Pulmonary - On room air. CXR with no unexpected changes. Report above. 3. Volume Overload - Good response to IV lasix yesterday. Continue oral Lasix, Wt ~2.5kg+ 4.  Expected post op acute blood loss anemia - H and H stable 5. CBGs OK. Continue metformin and SSI. 6. Disposition- anticipate discharge to home tomorrow.   Dale [place] 10/18/2020,9:06 AM   patient seen and examined, agree with above  Viviann Spare C. Dorris Fetch, MD [facility] [phone]

## 2020-10-19 LAB — GLUCOSE, CAPILLARY
Glucose-Capillary: 110 mg/dL — ABNORMAL HIGH (ref 70–99)
Glucose-Capillary: 137 mg/dL — ABNORMAL HIGH (ref 70–99)

## 2020-10-19 MED ORDER — METOPROLOL TARTRATE 25 MG PO TABS: 25.0000 mg | ORAL_TABLET | Freq: Two times a day (BID) | ORAL | 2 refills | Status: DC

## 2020-10-19 MED ORDER — OXYCODONE-ACETAMINOPHEN 7.5-325 MG PO TABS: 1.0000 | ORAL_TABLET | ORAL | 0 refills | Status: AC | PRN

## 2020-10-19 MED ORDER — CLOPIDOGREL BISULFATE 75 MG PO TABS: 75.0000 mg | ORAL_TABLET | Freq: Every day | ORAL | 11 refills | Status: DC

## 2020-10-19 NOTE — Progress Notes (Signed)
      [address]       [place] [address]             [phone]        5 Days Post-Op Procedure(s) (LRB): CORONARY ARTERY BYPASS GRAFTING (CABG) X FIVE, USING LEFT INTERNAL MAMMARY ARTERY AND BILATERAL LEGS GREATER SAPHENOUS VEINS HARVESTED ENDOSCOPICALLY (N/A) TRANSESOPHAGEAL ECHOCARDIOGRAM (TEE) (N/A)  Subjective: Awake and alert, feels well.  Pain controlled.  No concerns. Wants to go home today.   Objective: Vital signs in last 24 hours: Temp:  [98.2 F (36.8 C)-99.2 F (37.3 C)] 98.2 F (36.8 C) (03/06 0730) Pulse Rate:  [91-111] 91 (03/06 0730) Cardiac Rhythm: Normal sinus rhythm (03/06 0743) Resp:  [17-20] 17 (03/06 0730) BP: (104-128)/(53-87) 127/63 (03/06 0730) SpO2:  [96 %-99 %] 97 % (03/06 0730) Weight:  [95.1 kg] 95.1 kg (03/06 0619)  Pre op weight 93 kg Current Weight  10/19/20 95.1 kg      Intake/Output from previous day: 03/05 0701 - 03/06 0700 In: 3 [I.V.:3] Out: 2100 [Urine:2100]   Physical Exam:  Cardiovascular: RRR Pulmonary: Clear but diminished bibasilar breath sounds Abdomen: Soft, non tender, bowel sounds present. Extremities:  Mild expected ecchymosis bilateral thighs. Minimal edema Wounds: Sternal and bilateral LE wounds are clean and dry.  Lab Results: CBC: Recent Labs    10/17/20 0229  WBC 9.4  HGB 10.0*  HCT 31.6*  PLT 165   BMET:  Recent Labs    10/17/20 0229  NA 134*  K 4.1  CL 101  CO2 24  GLUCOSE 95  BUN 18  CREATININE 0.89  CALCIUM 9.0    PT/INR:  Lab Results  Component Value Date   INR 1.3 (H) 10/14/2020   INR 1.0 10/12/2020   INR 1.0 10/10/2020   ABG:  INR: Will add last result for INR, ABG once components are confirmed Will add last 4 CBG results once components are confirmed   Assessment/Plan:  1. CV - S/p NSTEMI. SR. On Lopressor 25 mg bid, Plavix and  ec asa to 81 mg daily. Rhythm stable 2.  Pulmonary - On room air. CXR with no unexpected changes.  3. Volume Overload  - Resolved, down to pre-op Wt.  4.  Expected post op acute blood loss anemia - H and H stable 5. CBGs OK. Continue metformin and SSI. 6. Disposition-  discharge to home today  Danitza Schoenfeldt G. RoddenberryPA-C 10/19/2020,9:15 AM

## 2020-10-19 NOTE — Progress Notes (Signed)
Mobility Specialist: Progress Note   10/19/20 1047  Mobility  Activity Ambulated in hall  Level of Assistance Modified independent, requires aide device or extra time  Assistive Device Front wheel walker  Distance Ambulated (ft) 470 ft  Mobility Response Tolerated well  Mobility performed by Mobility specialist  Bed Position Chair  $Mobility charge 1 Mobility   Pre-Mobility: 90 HR During Mobility: 110 HR Post-Mobility: 94 HR  Pt asx during ambulation. Pt to chair after walk and is waiting for discharge.   [facility] Danielle Ellis Mobility Specialist Mobility Specialist Phone: [phone]

## 2020-10-19 NOTE — Progress Notes (Signed)
Discharge paperwork given to patient. Husband and daughter present. PIV removed. Telemetry box removed, CCMD notified. Personal belongings taken down with patient in wheelchair to vehicle.  Kenard Gower, RN

## 2020-10-20 LAB — ECHO INTRAOPERATIVE TEE
Height: 61 in
Weight: 3284.8 oz

## 2020-10-28 ENCOUNTER — Ambulatory Visit (INDEPENDENT_AMBULATORY_CARE_PROVIDER_SITE_OTHER): Payer: Self-pay

## 2020-10-28 ENCOUNTER — Other Ambulatory Visit: Payer: Self-pay

## 2020-10-28 DIAGNOSIS — Z4802 Encounter for removal of sutures: Secondary | ICD-10-CM

## 2020-10-28 NOTE — Progress Notes (Signed)
Patient arrived for nurse visit to remove three sutures post- procedure CABG 10/14/20 with Dr. Laneta Simmers.  Sutures removed with no signs/ symptoms of infection noted.  Patient tolerated procedure well.  Patient/ family instructed to keep the incision sites clean and dry.   Incisions healing nicely.  Patient/ family acknowledged instructions given. Patient no longer taking narcotic for pain, Tylenol occasionally.  She also stated that she is having difficulty with changes in taste and smell and asked when this would resolve.  Advised that no definitive answer could be given, but to revisit those foods at a later date.  She acknowledged receipt.  Patient is aware of her follow-up appointment.

## 2020-10-30 ENCOUNTER — Telehealth (INDEPENDENT_AMBULATORY_CARE_PROVIDER_SITE_OTHER): Payer: Medicare Other | Admitting: Family

## 2020-10-30 ENCOUNTER — Encounter: Payer: Self-pay | Admitting: Family

## 2020-10-30 VITALS — BP 128/73

## 2020-10-30 DIAGNOSIS — I10 Essential (primary) hypertension: Secondary | ICD-10-CM

## 2020-10-30 DIAGNOSIS — Z951 Presence of aortocoronary bypass graft: Secondary | ICD-10-CM

## 2020-10-30 DIAGNOSIS — E785 Hyperlipidemia, unspecified: Secondary | ICD-10-CM

## 2020-10-30 DIAGNOSIS — E119 Type 2 diabetes mellitus without complications: Secondary | ICD-10-CM

## 2020-10-30 DIAGNOSIS — Z09 Encounter for follow-up examination after completed treatment for conditions other than malignant neoplasm: Secondary | ICD-10-CM | POA: Diagnosis not present

## 2020-10-30 DIAGNOSIS — I252 Old myocardial infarction: Secondary | ICD-10-CM | POA: Diagnosis not present

## 2020-10-30 DIAGNOSIS — I2510 Atherosclerotic heart disease of native coronary artery without angina pectoris: Secondary | ICD-10-CM

## 2020-10-30 MED ORDER — GLUCOSE BLOOD VI STRP: ORAL_STRIP | 12 refills | Status: DC

## 2020-10-30 MED ORDER — ALPRAZOLAM 0.25 MG PO TABS: 0.2500 mg | ORAL_TABLET | Freq: Every evening | ORAL | 0 refills | Status: DC | PRN

## 2020-10-30 MED ORDER — ROSUVASTATIN CALCIUM 5 MG PO TABS: 5.0000 mg | ORAL_TABLET | Freq: Every day | ORAL | 2 refills | Status: DC

## 2020-10-30 MED ORDER — BLOOD GLUCOSE METER KIT: PACK | 0 refills | Status: DC

## 2020-10-30 MED ORDER — BLOOD GLUCOSE METER KIT: PACK | 0 refills | Status: AC

## 2020-10-30 NOTE — Progress Notes (Signed)
Virtual Visit via telephone Note Due to COVID-19 pandemic this visit was conducted virtually. This visit type was conducted due to national recommendations for restrictions regarding the COVID-19 Pandemic (e.g. social distancing, sheltering in place) in an effort to limit this patient's exposure and mitigate transmission in our community. All issues noted in this document were discussed and addressed.  A physical exam was not performed with this format.  I connected with Danielle Ellis on 10/30/20 at 12:07 pm  by video and verified that I am speaking with the correct person using two identifiers. Danielle Ellis is currently located at home  and husband  is currently with her during visit. The provider, Evelina Dun, FNP is located in their office at time of visit.  I discussed the limitations, risks, security and privacy concerns of performing an evaluation and management service by telephone and the availability of in person appointments. I also discussed with the patient that there may be a patient responsible charge related to this service. The patient expressed understanding and agreed to proceed.   History and Present Illness:  HPI  Pt presents to the office today for hospital follow up. She went to the ED on 10/09/20 with chest pain and was diagnosed with NSTEMI. She had CABG X5. She is followed by Cardiologists. Her next follow up with them is on 11/06/20.   She saw her Cardiac and Thoracic Surgery on 10/28/20 and had her sutures removed from her chest tube. States the area looked red. She has a follow up With Dr. Margretta Sidle, Cardiothoracic surgery, for repeat chest x-ray on 03/30.   She states she is walking 15 minutes BID in her home. She is starting PT next week.   She denies any chest pain, SOB, or fevers. Report mild swelling in her ankles that is improving.   She is a diabetic. Her last A1C was 6.2.   Review of Systems  Constitutional: Positive for malaise/fatigue.  All  other systems reviewed and are negative.    Observations/Objective: No SOB or distress noted, patient looks well. Husband at side.   Assessment and Plan: 1. Hospital discharge follow-up - CMP14+EGFR - CBC with Differential/Platelet  2. History of non-ST elevation myocardial infarction (NSTEMI) - CMP14+EGFR - CBC with Differential/Platelet  3. Hypertension, unspecified type - CMP14+EGFR - CBC with Differential/Platelet  4. S/P CABG x 5 - CMP14+EGFR - CBC with Differential/Platelet  5. Coronary artery disease involving native heart without angina pectoris, unspecified vessel or lesion type - CMP14+EGFR - CBC with Differential/Platelet  6. Type 2 diabetes mellitus without complication, without long-term current use of insulin (HCC) - blood glucose meter kit and supplies; Dispense based on patient and insurance preference. Use up to four times daily as directed. (FOR ICD-10 E10.9, E11.9).  Dispense: 1 each; Refill: 0 - glucose blood test strip; Use as instructed  Dispense: 100 each; Refill: 12 - CMP14+EGFR - CBC with Differential/Platelet  7. Hyperlipidemia, unspecified hyperlipidemia type - rosuvastatin (CRESTOR) 5 MG tablet; Take 1 tablet (5 mg total) by mouth daily.  Dispense: 90 tablet; Refill: 2 - CMP14+EGFR - CBC with Differential/Platelet     I discussed the assessment and treatment plan with the patient. The patient was provided an opportunity to ask questions and all were answered. The patient agreed with the plan and demonstrated an understanding of the instructions.   The patient was advised to call back or seek an in-person evaluation if the symptoms worsen or if the condition fails to improve as anticipated.  The above assessment and management plan was discussed with the patient. The patient verbalized understanding of and has agreed to the management plan. Patient is aware to call the clinic if symptoms persist or worsen. Patient is aware when to return to the  clinic for a follow-up visit. Patient educated on when it is appropriate to go to the emergency department.   Time call ended:  12:32 pm   I provided 25 minutes of non-face-to-face time during this encounter.    Evelina Dun, FNP

## 2020-10-30 NOTE — Addendum Note (Signed)
Addended by: Ignacia Bayley on: 10/30/2020 01:42 PM   Modules accepted: Orders

## 2020-11-03 ENCOUNTER — Telehealth: Payer: Self-pay | Admitting: *Deleted

## 2020-11-03 MED ORDER — ONETOUCH ULTRA VI STRP: ORAL_STRIP | 3 refills | Status: DC

## 2020-11-03 MED ORDER — ONETOUCH ULTRASOFT LANCETS MISC: 3 refills | Status: DC

## 2020-11-03 NOTE — Telephone Encounter (Signed)
Pt received OneTouch Ultra glucometer but needs test strips & lancets sent into [facility]

## 2020-11-04 NOTE — Progress Notes (Signed)
Cardiology Clinic Note   Patient Name: Danielle Ellis Date of Encounter: 11/06/2020  Primary Care Provider:  [name], FNP Primary Cardiologist:  Peter Martinique, MD  Patient Profile    Danielle Ellis. [place] 65 year old female presents to the clinic for follow-up evaluation of her coronary artery disease status post NSTEMI 10/09/2020 and hypertension.  Past Medical History    Past Medical History:  Diagnosis Date  . Anxiety   . Hypertension   . Sleep apnea    Past Surgical History:  Procedure Laterality Date  . APPENDECTOMY    . CORONARY ARTERY BYPASS GRAFT N/A 10/14/2020   Procedure: CORONARY ARTERY BYPASS GRAFTING (CABG) X FIVE, USING LEFT INTERNAL MAMMARY ARTERY AND BILATERAL LEGS GREATER SAPHENOUS VEINS HARVESTED ENDOSCOPICALLY;  Surgeon: Gaye Pollack, MD;  Location: [facility];  Service: Open Heart Surgery;  Laterality: N/A;  . L ankle surgery    . LEFT HEART CATH AND CORONARY ANGIOGRAPHY N/A 10/10/2020   Procedure: LEFT HEART CATH AND CORONARY ANGIOGRAPHY;  Surgeon: Martinique, Peter M, MD;  Location: [facility];  Service: Cardiovascular;  Laterality: N/A;  . TEE WITHOUT CARDIOVERSION N/A 10/14/2020   Procedure: TRANSESOPHAGEAL ECHOCARDIOGRAM (TEE);  Surgeon: Gaye Pollack, MD;  Location: [facility];  Service: Open Heart Surgery;  Laterality: N/A;    Allergies  No Known Allergies  History of Present Illness    Ms. Beel has a PMH of hypertension, coronary artery disease status post NSTEMI 10/09/2020, type 2 diabetes, hyperlipidemia, generalized anxiety disorder, CABG x5 on 10/14/2020 (LIMA-LAD, SVG-diagonal, SVG-OM1/OM2, and SVG-PDA)  She was admitted to the hospital on 10/10/2020.  She reported several week history of recurrent episodes of substernal chest pain that would radiate to her left shoulder and left hand.  She also noted some associated shortness of breath diaphoresis and nausea.  She was noted to have mildly elevated troponins and her EKG showed diffuse ST  depressions  with new T wave inversions inferiorly.  Echocardiogram showed normal LVEF with no significant valvular abnormalities.  She underwent cardiac catheterization which showed severe multivessel disease.  She underwent CABG x5 on 10/14/2020.  She progressed well and was transferred to 4 E. from the ICU on 10/16/2020.  She maintained sinus rhythm.  Due to her NSTEMI she was placed on Plavix and her aspirin was decreased to 81 mg daily.  Her incisions were healing well.  She was tolerating her diet well.  Her chest tube sutures were removed in the office after discharge.  She was discharged in stable condition on 10/19/2020.  She presents to the clinic today for follow-up evaluation states she feels well.  She has been walking 15 minutes 2 times per day.  She is following a low-sodium diet.  She reports that she has been sleeping in a recliner due to comfort.  She denies orthopnea and PND.  We discussed sleep apnea.  She was previously on CPAP but discontinued use several years ago.  I will reorder a sleep study.  Her blood pressure has been elevated slightly at home in the 140s-150s over 80s.  We will start HCTZ 12.5 mg daily.  We will have a BMP drawn in 1 week, in 3 to 4 weeks she will send me her blood pressure log, and I will have her follow-up in 3 months.  Surgical incision sites clean dry intact no drainage and healing well.  Today she denies chest pain, shortness of breath, lower extremity edema, fatigue, palpitations, melena, hematuria, hemoptysis, diaphoresis, weakness, presyncope,  syncope, orthopnea, and PND.   Home Medications    Prior to Admission medications   Medication Sig Start Date End Date Taking? Authorizing Provider  ALPRAZolam (XANAX) 0.25 MG tablet Take 1 tablet (0.25 mg total) by mouth at bedtime as needed for anxiety. 10/30/20   [name], FNP  blood glucose meter kit and supplies Dispense based on patient and insurance preference. Use up to four times daily as directed.  (FOR ICD-10 E10.9, E11.9). 10/30/20   Evelina Dun A, FNP  calcium gluconate 500 MG tablet Take 500 mg by mouth daily.    [provider]  clopidogrel (PLAVIX) 75 MG tablet Take 1 tablet (75 mg total) by mouth daily. 10/20/20   Antony Odea, PA-C  escitalopram (LEXAPRO) 10 MG tablet Take 1 tablet (10 mg total) by mouth daily. 10/03/20   [name], FNP  glucose blood (ONETOUCH ULTRA) test strip Test BS 4 times daily Dx E11.9 11/03/20   [name], FNP  Lancets [facility] ULTRASOFT) lancets Test BS 4 times daily Dx E11.9 11/03/20   Evelina Dun A, FNP  latanoprost (XALATAN) 0.005 % ophthalmic solution Place 1 drop into both eyes at bedtime. 02/12/20   [provider]  metFORMIN (GLUCOPHAGE) 500 MG tablet Take 1 tablet (500 mg total) by mouth daily with breakfast. 10/03/20   Evelina Dun A, FNP  metoprolol tartrate (LOPRESSOR) 25 MG tablet Take 1 tablet (25 mg total) by mouth 2 (two) times daily. 10/19/20   Antony Odea, PA-C  Multiple Vitamin (MULTIVITAMIN) capsule Take 1 capsule by mouth daily.    [provider]  rosuvastatin (CRESTOR) 5 MG tablet Take 1 tablet (5 mg total) by mouth daily. 10/30/20   [name], FNP  valACYclovir (VALTREX) 1000 MG tablet TAKE 2 TABLETS BY MOUTH  TWICE DAILY Patient taking differently: Take 1,000 mg by mouth 2 (two) times daily as needed. 05/13/20   [name], FNP    Family History    Family History  Problem Relation Age of Onset  . Diabetes Mother   . Kidney disease Mother   . Cancer Mother   . Heart attack Mother    She indicated that her mother is deceased.  Social History    Social History   Socioeconomic History  . Marital status: Married    Spouse name: Not on file  . Number of children: Not on file  . Years of education: Not on file  . Highest education level: Not on file  Occupational History  . Not on file  Tobacco Use  . Smoking status: Never Smoker  . Smokeless tobacco:  Never Used  Vaping Use  . Vaping Use: Never used  Substance and Sexual Activity  . Alcohol use: No  . Drug use: No  . Sexual activity: Yes    Partners: Male  Other Topics Concern  . Not on file  Social History Narrative  . Not on file   Social Determinants of Health   Financial Resource Strain: Not on file  Food Insecurity: Not on file  Transportation Needs: Not on file  Physical Activity: Not on file  Stress: Not on file  Social Connections: Not on file  Intimate Partner Violence: Not on file     Review of Systems    General:  No chills, fever, night sweats or weight changes.  Cardiovascular:  No chest pain, dyspnea on exertion, edema, orthopnea, palpitations, paroxysmal nocturnal dyspnea. Dermatological: No rash, lesions/masses Respiratory: No cough, dyspnea Urologic:  No hematuria, dysuria Abdominal:   No nausea, vomiting, diarrhea, bright red blood per rectum, melena, or hematemesis Neurologic:  No visual changes, wkns, changes in mental status. All other systems reviewed and are otherwise negative except as noted above.  Physical Exam    VS:  BP (!) 158/88   Pulse 86   Ht 5' 1.5" (1.562 m)   Wt 199 lb 9.6 oz (90.5 kg)   SpO2 96%   BMI 37.10 kg/m  , BMI Body mass index is 37.1 kg/m. GEN: Well nourished, well developed, in no acute distress. HEENT: normal. Neck: Supple, no JVD, carotid bruits, or masses. Cardiac: RRR, no murmurs, rubs, or gallops. No clubbing, cyanosis, edema.  Radials/DP/PT 2+ and equal bilaterally.  Respiratory:  Respirations regular and unlabored, clear to auscultation bilaterally. GI: Soft, nontender, nondistended, BS + x 4. MS: no deformity or atrophy. Skin: warm and dry, no rash. Neuro:  Strength and sensation are intact. Psych: Normal affect.  Accessory Clinical Findings    Recent Labs: 10/03/2020: ALT 41 10/09/2020: TSH 4.842 10/15/2020: Magnesium 2.3 10/17/2020: BUN 18; Creatinine, Ser 0.89; Hemoglobin 10.0; Platelets 165; Potassium  4.1; Sodium 134   Recent Lipid Panel    Component Value Date/Time   CHOL 272 (H) 10/10/2020 0144   CHOL 269 (H) 03/10/2020 0958   TRIG 285 (H) 10/10/2020 0144   HDL 33 (L) 10/10/2020 0144   HDL 43 03/10/2020 0958   CHOLHDL 8.2 10/10/2020 0144   VLDL 57 (H) 10/10/2020 0144   LDLCALC 182 (H) 10/10/2020 0144   LDLCALC 152 (H) 03/10/2020 0958    ECG personally reviewed by me today-normal sinus rhythm nonspecific ST and T wave abnormality 86 bpm  Echocardiogram 10/10/2020 IMPRESSIONS    1. Left ventricular ejection fraction, by estimation, is 70 to 75%. The  left ventricle has hyperdynamic function. The left ventricle has no  regional wall motion abnormalities. Left ventricular diastolic parameters  are consistent with Grade I diastolic  dysfunction (impaired relaxation).  2. Right ventricular systolic function is normal. The right ventricular  size is normal.  3. The mitral valve is normal in structure. No evidence of mitral valve  regurgitation. No evidence of mitral stenosis.  4. The aortic valve is normal in structure. Aortic valve regurgitation is  not visualized. Mild to moderate aortic valve sclerosis/calcification is  present, without any evidence of aortic stenosis.  5. The inferior vena cava is normal in size with greater than 50%  respiratory variability, suggesting right atrial pressure of 3 mmHg.  Cardiac catheterization 10/10/2020  Prox LAD to Mid LAD lesion is 60% stenosed.  Mid LAD lesion is 60% stenosed.  Prox Cx to Mid Cx lesion is 85% stenosed.  1st Diag lesion is 90% stenosed.  Prox RCA lesion is 90% stenosed.  Mid RCA to Dist RCA lesion is 99% stenosed.  The left ventricular systolic function is normal.  LV end diastolic pressure is normal.  The left ventricular ejection fraction is 55-65% by visual estimate.   1. 3 vessel obstructive CAD   - long 60% areas of stenosis in the proximal and mid LAD with abnormal RFR of 0.85   - 90% ostial  first diagonal   -85% proximal to mid LCx   - 90% proximal RCA. Long 99% mid RCA 2. Normal LV function 3. Normal LVEDP  Plan: recommend CT surgery consultation for CABG   Assessment & Plan   1.  Coronary artery disease-status post CABG x5.  No chest pain today.  Increasing physical  activity slowly.  Reports compliance with sternal precautions.  Surgical incisions clean dry intact no drainage. Continue Plavix, aspirin, rosuvastatin Heart healthy low-sodium diet-salty 6 given Increase physical activity slowly  Essential hypertension-BP today 158/88.  130s-140s over 80s at home.  Reports over the last week her blood pressure has started to increase.  At the hospital systolic in the 025'K. Continue to monitor. Heart healthy low-sodium diet-salty 6 given Increase physical activity slowly-maintain sternal precautions BMP in 1 week Start HCTZ 12.5 mg daily Blood pressure log-send blood pressure log in 3 to 4 weeks.  Hyperlipidemia-10/10/2020: Cholesterol 272; HDL 33; LDL Cholesterol 182; Triglycerides 285; VLDL 57 Continue rosuvastatin Heart healthy low-sodium high-fiber diet Increase physical activity slowly-maintain sternal precautions  OSA/snoring/daytime somnolence -reports she has not worn it for years.   Order sleep study   Type 2 diabetes-glucose 95 on 10/17/2020 Continue Metformin Heart healthy low-sodium carb modified diet   Disposition: Follow-up with Dr. Martinique in 3 months.  Jossie Ng. Tierney Behl NP-C    11/06/2020, 2:40 PM [facility] [address] [address] Office [phone] Fax [phone]  Notice: This dictation was prepared with Dragon dictation along with smaller phrase technology. Any transcriptional errors that result from this process are unintentional and may not be corrected upon review.  I spent 15 minutes examining this patient, reviewing medications, and using patient centered shared decision making involving her cardiac care.   Prior to her visit I spent greater than 20 minutes reviewing her past medical history,  medications, and prior cardiac tests.

## 2020-11-06 ENCOUNTER — Encounter: Payer: Self-pay | Admitting: General Practice

## 2020-11-06 ENCOUNTER — Ambulatory Visit (INDEPENDENT_AMBULATORY_CARE_PROVIDER_SITE_OTHER): Payer: Medicare Other | Admitting: General Practice

## 2020-11-06 ENCOUNTER — Other Ambulatory Visit: Payer: Self-pay

## 2020-11-06 VITALS — BP 158/88 | HR 86 | Ht 61.5 in | Wt 199.6 lb

## 2020-11-06 DIAGNOSIS — I2510 Atherosclerotic heart disease of native coronary artery without angina pectoris: Secondary | ICD-10-CM | POA: Diagnosis not present

## 2020-11-06 DIAGNOSIS — G4733 Obstructive sleep apnea (adult) (pediatric): Secondary | ICD-10-CM

## 2020-11-06 DIAGNOSIS — R400 Somnolence: Secondary | ICD-10-CM | POA: Diagnosis not present

## 2020-11-06 DIAGNOSIS — E782 Mixed hyperlipidemia: Secondary | ICD-10-CM

## 2020-11-06 DIAGNOSIS — E119 Type 2 diabetes mellitus without complications: Secondary | ICD-10-CM

## 2020-11-06 DIAGNOSIS — I10 Essential (primary) hypertension: Secondary | ICD-10-CM | POA: Diagnosis not present

## 2020-11-06 MED ORDER — HYDROCHLOROTHIAZIDE 12.5 MG PO CAPS: 12.5000 mg | ORAL_CAPSULE | Freq: Every day | ORAL | 6 refills | Status: DC

## 2020-11-06 NOTE — Patient Instructions (Signed)
Medication Instructions:  Start hydrochlorothiazide (HCYZ) 12.5MG  DAILY  *If you need a refill on your cardiac medications before your next appointment, please call your pharmacy*  Lab Work: GET BMET AT YOUR PRIMARY CARE  Testing/Procedures: Your physician has recommended that you have a sleep study. This test records several body functions during sleep, including: brain activity, eye movement, oxygen and carbon dioxide blood levels, heart rate and rhythm, breathing rate and rhythm, the flow of air through your mouth and nose, snoring, body muscle movements, and chest and belly movement.  Special Instructions CONTINUE STERNAL PRECAUTIONS  TAKE AND LOG YOUR BLOOD PRESSURE DAILY-1 HOUR AFTER TAKING YOUR MEDICATION AND SEND [place] RESULTS IN 3-4 IN YOUR MYCHART ACCOUNT FOR JESSE TO REVIEW  PLEASE READ AND FOLLOW SALTY 6-ATTACHED-1,800 mg daily  PLEASE INCREASE PHYSICAL ACTIVITY AS TOLERATED  Follow-Up: Your next appointment:  3 month(s) In Person with Peter [place], MD OR IF UNAVAILABLE JESSE CLEAVER, FNP-C  At [facility], you and your health needs are our priority.  As part of our continuing mission to provide you with exceptional heart care, we have created designated Provider Care Teams.  These Care Teams include your primary Cardiologist (physician) and Advanced Practice Providers (APPs -  Physician Assistants and Nurse Practitioners) who all work together to provide you with the care you need, when you need it.  We recommend signing up for the patient portal called "MyChart".  Sign up information is provided on this After Visit Summary.  MyChart is used to connect with patients for Virtual Visits (Telemedicine).  Patients are able to view lab/test results, encounter notes, upcoming appointments, etc.  Non-urgent messages can be sent to your provider as well.   To learn more about what you can do with MyChart, go to [email or web address].

## 2020-11-07 ENCOUNTER — Other Ambulatory Visit: Payer: Self-pay | Admitting: Family Medicine

## 2020-11-07 DIAGNOSIS — I252 Old myocardial infarction: Secondary | ICD-10-CM

## 2020-11-10 ENCOUNTER — Other Ambulatory Visit: Payer: Self-pay

## 2020-11-10 ENCOUNTER — Other Ambulatory Visit: Payer: Medicare Other

## 2020-11-10 DIAGNOSIS — I252 Old myocardial infarction: Secondary | ICD-10-CM

## 2020-11-11 ENCOUNTER — Other Ambulatory Visit: Payer: Self-pay | Admitting: Surgery

## 2020-11-11 DIAGNOSIS — Z951 Presence of aortocoronary bypass graft: Secondary | ICD-10-CM

## 2020-11-11 LAB — CBC WITH DIFFERENTIAL/PLATELET
Basophils Absolute: 0 10*3/uL (ref 0.0–0.2)
Basos: 1 %
EOS (ABSOLUTE): 0.1 10*3/uL (ref 0.0–0.4)
Eos: 3 %
Hematocrit: 39.2 % (ref 34.0–46.6)
Hemoglobin: 12.4 g/dL (ref 11.1–15.9)
Immature Grans (Abs): 0 10*3/uL (ref 0.0–0.1)
Immature Granulocytes: 0 %
Lymphocytes Absolute: 1.8 10*3/uL (ref 0.7–3.1)
Lymphs: 38 %
MCH: 27.2 pg (ref 26.6–33.0)
MCHC: 31.6 g/dL (ref 31.5–35.7)
MCV: 86 fL (ref 79–97)
Monocytes Absolute: 0.3 10*3/uL (ref 0.1–0.9)
Monocytes: 7 %
Neutrophils Absolute: 2.5 10*3/uL (ref 1.4–7.0)
Neutrophils: 51 %
Platelets: 225 10*3/uL (ref 150–450)
RBC: 4.56 x10E6/uL (ref 3.77–5.28)
RDW: 13.8 % (ref 11.7–15.4)
WBC: 4.8 10*3/uL (ref 3.4–10.8)

## 2020-11-11 LAB — BMP8+EGFR
BUN/Creatinine Ratio: 16 (ref 12–28)
BUN: 14 mg/dL (ref 8–27)
CO2: 20 mmol/L (ref 20–29)
Calcium: 10.1 mg/dL (ref 8.7–10.3)
Chloride: 104 mmol/L (ref 96–106)
Creatinine, Ser: 0.88 mg/dL (ref 0.57–1.00)
Glucose: 104 mg/dL — ABNORMAL HIGH (ref 65–99)
Potassium: 4.2 mmol/L (ref 3.5–5.2)
Sodium: 144 mmol/L (ref 134–144)
eGFR: 72 mL/min/{1.73_m2} (ref >59)

## 2020-11-12 ENCOUNTER — Ambulatory Visit (INDEPENDENT_AMBULATORY_CARE_PROVIDER_SITE_OTHER): Payer: Self-pay | Admitting: Surgery

## 2020-11-12 ENCOUNTER — Other Ambulatory Visit: Payer: Self-pay

## 2020-11-12 ENCOUNTER — Telehealth: Payer: Self-pay | Admitting: Cardiology

## 2020-11-12 ENCOUNTER — Encounter: Payer: Self-pay | Admitting: Surgery

## 2020-11-12 ENCOUNTER — Ambulatory Visit
Admission: RE | Admit: 2020-11-12 | Discharge: 2020-11-12 | Disposition: A | Discharge status: Home / Self Care | Payer: Medicare Other | Source: Ambulatory Visit | Attending: Surgery | Admitting: Surgery

## 2020-11-12 VITALS — BP 150/85 | HR 90 | Resp 20 | Ht 61.0 in | Wt 196.0 lb

## 2020-11-12 DIAGNOSIS — Z0279 Encounter for issue of other medical certificate: Secondary | ICD-10-CM

## 2020-11-12 DIAGNOSIS — Z951 Presence of aortocoronary bypass graft: Secondary | ICD-10-CM

## 2020-11-12 NOTE — Progress Notes (Signed)
HPI: Patient returns for routine postoperative follow-up having undergone coronary bypass graft surgery x5 on 10/14/2020. The patient's early postoperative recovery while in the hospital was notable for an uncomplicated postoperative course. Since hospital discharge the patient reports that she has been feeling well.  She is walking 18 minutes twice daily without chest pain or shortness of breath.  Her stamina continues to improve.  She has been watching her diet closely.   Current Outpatient Medications  Medication Sig Dispense Refill  . ALPRAZolam (XANAX) 0.25 MG tablet Take 1 tablet (0.25 mg total) by mouth at bedtime as needed for anxiety. 20 tablet 0  . blood glucose meter kit and supplies Dispense based on patient and insurance preference. Use up to four times daily as directed. (FOR ICD-10 E10.9, E11.9). 1 each 0  . calcium gluconate 500 MG tablet Take 500 mg by mouth daily.    . clopidogrel (PLAVIX) 75 MG tablet Take 1 tablet (75 mg total) by mouth daily. 30 tablet 11  . escitalopram (LEXAPRO) 10 MG tablet Take 1 tablet (10 mg total) by mouth daily. 90 tablet 2  . glucose blood (ONETOUCH ULTRA) test strip Test BS 4 times daily Dx E11.9 400 each 3  . hydrochlorothiazide (MICROZIDE) 12.5 MG capsule Take 1 capsule (12.5 mg total) by mouth daily. 30 capsule 6  . latanoprost (XALATAN) 0.005 % ophthalmic solution Place 1 drop into both eyes at bedtime.    Marland Kitchen loratadine (CLARITIN) 10 MG tablet Take 10 mg by mouth daily.    . metFORMIN (GLUCOPHAGE) 500 MG tablet Take 1 tablet (500 mg total) by mouth daily with breakfast. 90 tablet 0  . metoprolol tartrate (LOPRESSOR) 25 MG tablet Take 1 tablet (25 mg total) by mouth 2 (two) times daily. 60 tablet 2  . Multiple Vitamin (MULTIVITAMIN) capsule Take 1 capsule by mouth daily.    . rosuvastatin (CRESTOR) 5 MG tablet Take 1 tablet (5 mg total) by mouth daily. 90 tablet 2  . valACYclovir (VALTREX) 1000 MG tablet TAKE 2 TABLETS BY MOUTH  TWICE DAILY  (Patient taking differently: Take 1,000 mg by mouth 2 (two) times daily as needed.) 12 tablet 1  . Lancets (ONETOUCH ULTRASOFT) lancets Test BS 4 times daily Dx E11.9 400 each 3   No current facility-administered medications for this visit.    Physical Exam: BP (!) 150/85   Pulse 90   Resp 20   Ht '5\' 1"'  (1.549 m)   Wt 196 lb (88.9 kg)   SpO2 95% Comment: RA  BMI 37.03 kg/m  She looks well. Cardiac exam shows a regular rate and rhythm with normal heart sounds. Lung exam is clear. The chest incision is healing well and the sternum is stable. Her leg incisions are healing well with minimal eschar.  There is no peripheral edema.  Diagnostic Tests:  Narrative & Impression  CLINICAL DATA:  Status post coronary artery bypass grafting  EXAM: CHEST - 2 VIEW  COMPARISON:  October 18, 2020  FINDINGS: Lungs are clear. The heart size and pulmonary vascularity are normal. No adenopathy. Patient is status post coronary artery bypass grafting. No bone lesions.  IMPRESSION: No edema or airspace opacity. Heart size normal. Status post coronary artery bypass grafting   Electronically Signed   By: Lowella Grip III M.D.   On: 11/12/2020 10:47      Impression:  Overall she is doing very well following coronary bypass surgery.  I encouraged her to continue walking as much possible.  She  is planning on participating in cardiac rehab.  I told her that she could return to driving a car but should refrain from lifting anything heavier than 10 pounds for 3 months postoperatively.  Plan:  She has a follow-up appointment in June with cardiology and will return to see me if she has any problems with her incisions.   Gaye Pollack, MD [facility] [phone]

## 2020-11-12 NOTE — Telephone Encounter (Signed)
Patient brought attending physicians statement into office on 3/30 to be completed by Dr.Jordan. Paperwork was put in providers box.

## 2020-11-13 NOTE — Telephone Encounter (Signed)
Paperwork received back from provider, paperwork was faxed, patient was mailed a copy of paperwork.

## 2020-11-17 ENCOUNTER — Ambulatory Visit: Payer: Medicare Other

## 2020-11-17 DIAGNOSIS — Z736 Limitation of activities due to disability: Secondary | ICD-10-CM

## 2020-12-04 ENCOUNTER — Telehealth: Payer: Self-pay | Admitting: Cardiology

## 2020-12-04 NOTE — Telephone Encounter (Signed)
Split night study placed for patient but she would like to do a HST. Can someone please place that order so we can get it precerted.

## 2020-12-04 NOTE — Telephone Encounter (Signed)
Routed to sleep coordinator to check on insurance coverage for HST and MD to advise if this is OK?

## 2020-12-05 ENCOUNTER — Other Ambulatory Visit: Payer: Self-pay

## 2020-12-05 NOTE — Telephone Encounter (Signed)
As long as they can do CPAP titration at home.  Ankita Newcomer [place] MD, [facility]

## 2020-12-08 ENCOUNTER — Ambulatory Visit (INDEPENDENT_AMBULATORY_CARE_PROVIDER_SITE_OTHER): Payer: Medicare Other

## 2020-12-08 ENCOUNTER — Other Ambulatory Visit: Payer: Self-pay | Admitting: Family

## 2020-12-08 VITALS — Ht 61.0 in | Wt 190.0 lb

## 2020-12-08 DIAGNOSIS — I10 Essential (primary) hypertension: Secondary | ICD-10-CM

## 2020-12-08 DIAGNOSIS — Z0000 Encounter for general adult medical examination without abnormal findings: Secondary | ICD-10-CM

## 2020-12-08 DIAGNOSIS — R7303 Prediabetes: Secondary | ICD-10-CM

## 2020-12-08 MED ORDER — METFORMIN HCL 500 MG PO TABS: 1.0000 | ORAL_TABLET | Freq: Every day | ORAL | 1 refills | Status: DC

## 2020-12-08 MED ORDER — FLUTICASONE PROPIONATE 50 MCG/ACT NA SUSP: 1.0000 | Freq: Every day | NASAL | 3 refills | Status: DC

## 2020-12-08 NOTE — Telephone Encounter (Signed)
Spoke to patient Edd Fabian NP reviewed B/P readings.He advised continue same medications.Advised to check B/P 1 to 2 times a week.Advised to keep appointment with Dr.Jordan 6/27 at 8:40 am.Advised to call sooner if needed.

## 2020-12-08 NOTE — Telephone Encounter (Signed)
Explained that HST would not included CPAP titration. Explained that split-night would included diagnostic part of sleep study + CPAP titration if she meets criteria for needing therapy. She will await call from [place] regarding the test.

## 2020-12-08 NOTE — Patient Instructions (Signed)
[name] , Thank you for taking time to come for your Medicare Wellness Visit. I appreciate your ongoing commitment to your health goals. Please review the following plan we discussed and let me know if I can assist you in the future.   Screening recommendations/referrals: Colonoscopy: Done 07/30/2015 - Repeat every 10 years Mammogram: Done 04/06/2019 - Repeat every year - Scheduled for 02/17/21 Bone Density: Done 06/30/2018 - Repeat every 5 years Recommended yearly ophthalmology/optometry visit for glaucoma screening and checkup Recommended yearly dental visit for hygiene and checkup  Vaccinations: Influenza vaccine: Done 10/03/20 -Repeat in Fall 2022 Pneumococcal vaccine: Done 06/14/2018 & 05/23/2019 Tdap vaccine: DUE Shingles vaccine: Shingrix discussed. Please contact your pharmacy for coverage information.    Covid-19: Done 10/11/19, 11/09/19, & 08/05/20 (Get 2nd booster in June 2022)  Advanced directives: Advance directive discussed with you today. I have provided a copy for you to complete at home and have notarized. Once this is complete please bring a copy in to our office so we can scan it into your chart.  Conditions/risks identified: Aim for 30 minutes of exercise or brisk walking each day, drink 6-8 glasses of water and eat lots of fruits and vegetables.  Next appointment: Follow up in one year for your annual wellness visit    Preventive Care 65 Years and Older, Female Preventive care refers to lifestyle choices and visits with your health care provider that can promote health and wellness. What does preventive care include?  A yearly physical exam. This is also called an annual well check.  Dental exams once or twice a year.  Routine eye exams. Ask your health care provider how often you should have your eyes checked.  Personal lifestyle choices, including:  Daily care of your teeth and gums.  Regular physical activity.  Eating a healthy diet.  Avoiding tobacco  and drug use.  Limiting alcohol use.  Practicing safe sex.  Taking low-dose aspirin every day.  Taking vitamin and mineral supplements as recommended by your health care provider. What happens during an annual well check? The services and screenings done by your health care provider during your annual well check will depend on your age, overall health, lifestyle risk factors, and family history of disease. Counseling  Your health care provider may ask you questions about your:  Alcohol use.  Tobacco use.  Drug use.  Emotional well-being.  Home and relationship well-being.  Sexual activity.  Eating habits.  History of falls.  Memory and ability to understand (cognition).  Work and work Astronomer.  Reproductive health. Screening  You may have the following tests or measurements:  Height, weight, and BMI.  Blood pressure.  Lipid and cholesterol levels. These may be checked every 5 years, or more frequently if you are over 59 years old.  Skin check.  Lung cancer screening. You may have this screening every year starting at age 11 if you have a 30-pack-year history of smoking and currently smoke or have quit within the past 15 years.  Fecal occult blood test (FOBT) of the stool. You may have this test every year starting at age 26.  Flexible sigmoidoscopy or colonoscopy. You may have a sigmoidoscopy every 5 years or a colonoscopy every 10 years starting at age 84.  Hepatitis C blood test.  Hepatitis B blood test.  Sexually transmitted disease (STD) testing.  Diabetes screening. This is done by checking your blood sugar (glucose) after you have not eaten for a while (fasting). You may have this done  every 1-3 years.  Bone density scan. This is done to screen for osteoporosis. You may have this done starting at age 58.  Mammogram. This may be done every 1-2 years. Talk to your health care provider about how often you should have regular mammograms. Talk with  your health care provider about your test results, treatment options, and if necessary, the need for more tests. Vaccines  Your health care provider may recommend certain vaccines, such as:  Influenza vaccine. This is recommended every year.  Tetanus, diphtheria, and acellular pertussis (Tdap, Td) vaccine. You may need a Td booster every 10 years.  Zoster vaccine. You may need this after age 57.  Pneumococcal 13-valent conjugate (PCV13) vaccine. One dose is recommended after age 24.  Pneumococcal polysaccharide (PPSV23) vaccine. One dose is recommended after age 54. Talk to your health care provider about which screenings and vaccines you need and how often you need them. This information is not intended to replace advice given to you by your health care provider. Make sure you discuss any questions you have with your health care provider. Document Released: 08/29/2015 Document Revised: 04/21/2016 Document Reviewed: 06/03/2015 Elsevier Interactive Patient Education  2017 [employer].  Fall Prevention in the Home Falls can cause injuries. They can happen to people of all ages. There are many things you can do to make your home safe and to help prevent falls. What can I do on the outside of my home?  Regularly fix the edges of walkways and driveways and fix any cracks.  Remove anything that might make you trip as you walk through a door, such as a raised step or threshold.  Trim any bushes or trees on the path to your home.  Use bright outdoor lighting.  Clear any walking paths of anything that might make someone trip, such as rocks or tools.  Regularly check to see if handrails are loose or broken. Make sure that both sides of any steps have handrails.  Any raised decks and porches should have guardrails on the edges.  Have any leaves, snow, or ice cleared regularly.  Use sand or salt on walking paths during winter.  Clean up any spills in your garage right away. This  includes oil or grease spills. What can I do in the bathroom?  Use night lights.  Install grab bars by the toilet and in the tub and shower. Do not use towel bars as grab bars.  Use non-skid mats or decals in the tub or shower.  If you need to sit down in the shower, use a plastic, non-slip stool.  Keep the floor dry. Clean up any water that spills on the floor as soon as it happens.  Remove soap buildup in the tub or shower regularly.  Attach bath mats securely with double-sided non-slip rug tape.  Do not have throw rugs and other things on the floor that can make you trip. What can I do in the bedroom?  Use night lights.  Make sure that you have a light by your bed that is easy to reach.  Do not use any sheets or blankets that are too big for your bed. They should not hang down onto the floor.  Have a firm chair that has side arms. You can use this for support while you get dressed.  Do not have throw rugs and other things on the floor that can make you trip. What can I do in the kitchen?  Clean up any spills right  away.  Avoid walking on wet floors.  Keep items that you use a lot in easy-to-reach places.  If you need to reach something above you, use a strong step stool that has a grab bar.  Keep electrical cords out of the way.  Do not use floor polish or wax that makes floors slippery. If you must use wax, use non-skid floor wax.  Do not have throw rugs and other things on the floor that can make you trip. What can I do with my stairs?  Do not leave any items on the stairs.  Make sure that there are handrails on both sides of the stairs and use them. Fix handrails that are broken or loose. Make sure that handrails are as long as the stairways.  Check any carpeting to make sure that it is firmly attached to the stairs. Fix any carpet that is loose or worn.  Avoid having throw rugs at the top or bottom of the stairs. If you do have throw rugs, attach them to the  floor with carpet tape.  Make sure that you have a light switch at the top of the stairs and the bottom of the stairs. If you do not have them, ask someone to add them for you. What else can I do to help prevent falls?  Wear shoes that:  Do not have high heels.  Have rubber bottoms.  Are comfortable and fit you well.  Are closed at the toe. Do not wear sandals.  If you use a stepladder:  Make sure that it is fully opened. Do not climb a closed stepladder.  Make sure that both sides of the stepladder are locked into place.  Ask someone to hold it for you, if possible.  Clearly mark and make sure that you can see:  Any grab bars or handrails.  First and last steps.  Where the edge of each step is.  Use tools that help you move around (mobility aids) if they are needed. These include:  Canes.  Walkers.  Scooters.  Crutches.  Turn on the lights when you go into a dark area. Replace any light bulbs as soon as they burn out.  Set up your furniture so you have a clear path. Avoid moving your furniture around.  If any of your floors are uneven, fix them.  If there are any pets around you, be aware of where they are.  Review your medicines with your doctor. Some medicines can make you feel dizzy. This can increase your chance of falling. Ask your doctor what other things that you can do to help prevent falls. This information is not intended to replace advice given to you by your health care provider. Make sure you discuss any questions you have with your health care provider. Document Released: 05/29/2009 Document Revised: 01/08/2016 Document Reviewed: 09/06/2014 Elsevier Interactive Patient Education  2017 [employer].

## 2020-12-08 NOTE — Progress Notes (Signed)
Subjective:   Danielle Ellis is a 65 y.o. female who presents for an Initial Medicare Annual Wellness Visit.  Virtual Visit via Telephone Note  I connected with  Danielle Ellis on 12/08/20 at 11:15 AM EDT by telephone and verified that I am speaking with the correct person using two identifiers.  Location: Patient: Home Provider: [facility] Persons participating in the virtual visit: patient/Nurse Health Advisor   I discussed the limitations, risks, security and privacy concerns of performing an evaluation and management service by telephone and the availability of in person appointments. The patient expressed understanding and agreed to proceed.  Interactive audio and video telecommunications were attempted between this nurse and patient, however failed, due to patient having technical difficulties OR patient did not have access to video capability.  We continued and completed visit with audio only.  Some vital signs may be absent or patient reported.   Nithin Demeo E Karman Biswell, LPN   Review of Systems     Cardiac Risk Factors include: advanced age (>25mn, >>42women);diabetes mellitus;dyslipidemia;hypertension;family history of premature cardiovascular disease;obesity (BMI >30kg/m2)     Objective:    Today's Vitals   12/08/20 1038  Weight: 190 lb (86.2 kg)  Height: _0  (1.549 m)   Body mass index is 35.9 kg/m.  Advanced Directives 12/08/2020 10/10/2020 10/09/2020 12/07/2019  Does Patient Have a Medical Advance Directive? No No No No  Would patient like information on creating a medical advance directive? Yes (MAU/Ambulatory/Procedural Areas - Information given) No - Patient declined - Yes (Inpatient - patient defers creating a medical advance directive at this time - Information given)    Current Medications (verified) Outpatient Encounter Medications as of 12/08/2020  Medication Sig  . ALPRAZolam (XANAX) 0.25 MG tablet Take 1 tablet (0.25 mg total) by mouth at bedtime as needed for  anxiety.  . blood glucose meter kit and supplies Dispense based on patient and insurance preference. Use up to four times daily as directed. (FOR ICD-10 E10.9, E11.9).  . calcium gluconate 500 MG tablet Take 500 mg by mouth daily.  . clopidogrel (PLAVIX) 75 MG tablet Take 1 tablet (75 mg total) by mouth daily.  .Marland Kitchenescitalopram (LEXAPRO) 10 MG tablet Take 1 tablet (10 mg total) by mouth daily.  . fluticasone (FLONASE) 50 MCG/ACT nasal spray Place 1 spray into both nostrils daily.  .Marland Kitchenglucose blood (ONETOUCH ULTRA) test strip Test BS 4 times daily Dx E11.9  . hydrochlorothiazide (MICROZIDE) 12.5 MG capsule Take 1 capsule (12.5 mg total) by mouth daily.  .Marland Kitchenlatanoprost (XALATAN) 0.005 % ophthalmic solution Place 1 drop into both eyes at bedtime.  .Marland Kitchenloratadine (CLARITIN) 10 MG tablet Take 10 mg by mouth daily.  . metFORMIN (GLUCOPHAGE) 500 MG tablet TAKE 1 TABLET BY MOUTH  DAILY WITH BREAKFAST  . metoprolol tartrate (LOPRESSOR) 25 MG tablet Take 1 tablet (25 mg total) by mouth 2 (two) times daily.  . Multiple Vitamin (MULTIVITAMIN) capsule Take 1 capsule by mouth daily.  . rosuvastatin (CRESTOR) 5 MG tablet Take 1 tablet (5 mg total) by mouth daily.  . valACYclovir (VALTREX) 1000 MG tablet TAKE 2 TABLETS BY MOUTH  TWICE DAILY (Patient not taking: Reported on 12/08/2020)  . [DISCONTINUED] metFORMIN (GLUCOPHAGE) 500 MG tablet Take 1 tablet (500 mg total) by mouth daily with breakfast.   No facility-administered encounter medications on file as of 12/08/2020.    Allergies (verified) Patient has no known allergies.   History: Past Medical History:  Diagnosis Date  . Anxiety   .  Hypertension   . Sleep apnea    Past Surgical History:  Procedure Laterality Date  . APPENDECTOMY    . CORONARY ARTERY BYPASS GRAFT N/A 10/14/2020   Procedure: CORONARY ARTERY BYPASS GRAFTING (CABG) X FIVE, USING LEFT INTERNAL MAMMARY ARTERY AND BILATERAL LEGS GREATER SAPHENOUS VEINS HARVESTED ENDOSCOPICALLY;  Surgeon:  Gaye Pollack, MD;  Location: [facility];  Service: Open Heart Surgery;  Laterality: N/A;  . L ankle surgery    . LEFT HEART CATH AND CORONARY ANGIOGRAPHY N/A 10/10/2020   Procedure: LEFT HEART CATH AND CORONARY ANGIOGRAPHY;  Surgeon: Martinique, Peter M, MD;  Location: [facility];  Service: Cardiovascular;  Laterality: N/A;  . TEE WITHOUT CARDIOVERSION N/A 10/14/2020   Procedure: TRANSESOPHAGEAL ECHOCARDIOGRAM (TEE);  Surgeon: Gaye Pollack, MD;  Location: [facility];  Service: Open Heart Surgery;  Laterality: N/A;   Family History  Problem Relation Age of Onset  . Diabetes Mother   . Kidney disease Mother   . Cancer Mother   . Heart attack Mother    Social History   Socioeconomic History  . Marital status: Married    Spouse name: Not on file  . Number of children: 1  . Years of education: Not on file  . Highest education level: Not on file  Occupational History    Comment: retired  Tobacco Use  . Smoking status: Never Smoker  . Smokeless tobacco: Never Used  Vaping Use  . Vaping Use: Never used  Substance and Sexual Activity  . Alcohol use: No  . Drug use: No  . Sexual activity: Yes    Partners: Male  Other Topics Concern  . Not on file  Social History Narrative   Lives with her husband. They have one daughter, [name] - She lives 10 minutes away.   Social Determinants of Health   Financial Resource Strain: Low Risk   . Difficulty of Paying Living Expenses: Not hard at all  Food Insecurity: No Food Insecurity  . Worried About Charity fundraiser in the Last Year: Never true  . Ran Out of Food in the Last Year: Never true  Transportation Needs: No Transportation Needs  . Lack of Transportation (Medical): No  . Lack of Transportation (Non-Medical): No  Physical Activity: Sufficiently Active  . Days of Exercise per Week: 7 days  . Minutes of Exercise per Session: 50 min  Stress: No Stress Concern Present  . Feeling of Stress : Only a little  Social Connections: Socially  Integrated  . Frequency of Communication with Friends and Family: More than three times a week  . Frequency of Social Gatherings with Friends and Family: Once a week  . Attends Religious Services: More than 4 times per year  . Active Member of Clubs or Organizations: No  . Attends Archivist Meetings: More than 4 times per year  . Marital Status: Married    Tobacco Counseling Counseling given: Not Answered   Clinical Intake:  Pre-visit preparation completed: Yes  Pain : No/denies pain     BMI - recorded: 35.9 Nutritional Status: BMI > 30  Obese Nutritional Risks: None Diabetes: Yes CBG done?: No Did pt. bring in CBG monitor from home?: No  How often do you need to have someone help you when you read instructions, pamphlets, or other written materials from your doctor or pharmacy?: 1 - Never  Nutrition Risk Assessment:  Has the patient had any N/V/D within the last 2 months?  No  Does the patient  have any non-healing wounds?  No  Has the patient had any unintentional weight loss or weight gain?  No   Diabetes:  Is the patient diabetic?  Yes  If diabetic, was a CBG obtained today?  No  Did the patient bring in their glucometer from home?  No  How often do you monitor your CBG's? BID.   Financial Strains and Diabetes Management:  Are you having any financial strains with the device, your supplies or your medication? No .  Does the patient want to be seen by Chronic Care Management for management of their diabetes?  No  Would the patient like to be referred to a Nutritionist or for Diabetic Management?  No   Diabetic Exams:  Diabetic Eye Exam: Completed 09/2020. Pt has been advised about the importance in completing this exam.  Diabetic Foot Exam: Completed unknown. Pt has been advised about the importance in completing this exam. Pt is scheduled for diabetic foot exam on 01/2021.    Interpreter Needed?: No  Information entered by :: Elexus Barman,  LPN   Activities of Daily Living In your present state of health, do you have any difficulty performing the following activities: 12/08/2020 10/10/2020  Hearing? N -  Vision? N -  Difficulty concentrating or making decisions? N -  Walking or climbing stairs? Y -  Dressing or bathing? N -  Doing errands, shopping? N N  Preparing Food and eating ? N -  Using the Toilet? N -  In the past six months, have you accidently leaked urine? N -  Do you have problems with loss of bowel control? N -  Managing your Medications? N -  Managing your Finances? N -  Housekeeping or managing your Housekeeping? N -  Some recent data might be hidden    Patient Care Team: [name], FNP as PCP - General (Family Medicine) Martinique, Peter M, MD as PCP - Cardiology (Cardiology)  Indicate any recent Medical Services you may have received from other than [facility] providers in the past year (date may be approximate).     Assessment:   This is a routine wellness examination for Danielle Ellis.  Hearing/Vision screen  Hearing Screening   _0  _1  _2  _3  _4  _5  _6  _7  _8   Right ear:           Left ear:           Comments: Declines hearing difficulties   Vision Screening Comments: Annual visits with Dr Marijean Bravo in [place], [place] - wears eyeglasses - has glaucoma  Dietary issues and exercise activities discussed: Current Exercise Habits: Home exercise routine, Type of exercise: walking, Time (Minutes): 50, Frequency (Times/Week): 7, Weekly Exercise (Minutes/Week): 350, Intensity: Mild, Exercise limited by: cardiac condition(s)  Goals    . Exercise 3x per week (30 min per time)     Continue walking for 25 minutes twice per day. Work on losing 2-4 lbs per month until you get to your goal weight. Goals Addressed             This Visit's Progress   . Exercise 3x per week (30 min per time)       Continue walking for 25 minutes twice per day. Work on losing 2-4 lbs per month until you get  to your goal weight.             Depression Screen PHQ 2/9 Scores 12/08/2020 10/03/2020 12/07/2019 04/16/2019 03/16/2019  PHQ - 2 Score 0 1 0 0 0  PHQ-  9 Score - 3 2 - -    Fall Risk Fall Risk  12/08/2020 10/03/2020 03/12/2020 12/07/2019 04/16/2019  Falls in the past year? 0 0 0 0 0  Number falls in past yr: 0 - - - -  Injury with Fall? 0 - - - -  Risk for fall due to : Medication side effect;Impaired balance/gait - - - -    FALL RISK PREVENTION PERTAINING TO THE HOME:  Any stairs in or around the home? Yes  If so, are there any without handrails? No  Home free of loose throw rugs in walkways, pet beds, electrical cords, etc? Yes  Adequate lighting in your home to reduce risk of falls? Yes   ASSISTIVE DEVICES UTILIZED TO PREVENT FALLS:  Life alert? No  Use of a cane, walker or w/c? No  Grab bars in the bathroom? Yes  Shower chair or bench in shower? Yes  Elevated toilet seat or a handicapped toilet? Yes   TIMED UP AND GO:  Was the test performed? No . Telephonic visit.  Cognitive Function: Normal cognitive status assessed by direct observation by this Nurse Health Advisor. No abnormalities found.      6CIT Screen 12/07/2019  What Year? 0 points  What month? 0 points  What time? 0 points  Count back from 20 0 points  Months in reverse 0 points  Repeat phrase 0 points  Total Score 0    Immunizations Immunization History  Administered Date(s) Administered  . Fluad Quad(high Dose 65+) 05/23/2019, 10/03/2020  . Moderna Sars-Covid-2 Vaccination 10/11/2019, 11/09/2019, 08/05/2020  . Pneumococcal Conjugate-13 05/23/2019  . Pneumococcal-Unspecified 06/14/2018    TDAP status: Due, Education has been provided regarding the importance of this vaccine. Advised may receive this vaccine at local pharmacy or Health Dept. Aware to provide a copy of the vaccination record if obtained from local pharmacy or Health Dept. Verbalized acceptance and understanding.  Flu Vaccine status:  Up to date  Pneumococcal vaccine status: Up to date  Covid-19 vaccine status: Completed vaccines  Qualifies for Shingles Vaccine? Yes   Zostavax completed No   Shingrix Completed?: No.    Education has been provided regarding the importance of this vaccine. Patient has been advised to call insurance company to determine out of pocket expense if they have not yet received this vaccine. Advised may also receive vaccine at local pharmacy or Health Dept. Verbalized acceptance and understanding.  Screening Tests Health Maintenance  Topic Date Due  . FOOT EXAM  Never done  . OPHTHALMOLOGY EXAM  Never done  . TETANUS/TDAP  Never done  . MAMMOGRAM  04/05/2020  . URINE MICROALBUMIN  03/12/2021  . INFLUENZA VACCINE  03/16/2021  . HEMOGLOBIN A1C  04/08/2021  . PNA vac Low Risk Adult (2 of 2 - PPSV23) 06/15/2023  . DEXA SCAN  07/01/2023  . COLONOSCOPY (Pts 45-63yr Insurance coverage will need to be confirmed)  07/29/2025  . COVID-19 Vaccine  Completed  . Hepatitis C Screening  Completed  . HPV VACCINES  Aged Out    Health Maintenance  Health Maintenance Due  Topic Date Due  . FOOT EXAM  Never done  . OPHTHALMOLOGY EXAM  Never done  . TETANUS/TDAP  Never done  . MAMMOGRAM  04/05/2020    Colorectal cancer screening: Type of screening: Colonoscopy. Completed 07/30/2015. Repeat every 10 years  Mammogram status: Completed 04/06/2019. Repeat every year  Bone Density status: Completed 06/30/2018. Results reflect: Bone density results: NORMAL. Repeat every 5 years.  Lung Cancer  Screening: (Low Dose CT Chest recommended if Age 60-80 years, 30 pack-year currently smoking OR have quit w/in 15years.) does not qualify.   Additional Screening:  Hepatitis C Screening: does qualify; Completed 03/10/2020  Vision Screening: Recommended annual ophthalmology exams for early detection of glaucoma and other disorders of the eye. Is the patient up to date with their annual eye exam?  Yes  Who is the  provider or what is the name of the office in which the patient attends annual eye exams? Dr Burr Medico in [place], [place] If pt is not established with a provider, would they like to be referred to a provider to establish care? No .   Dental Screening: Recommended annual dental exams for proper oral hygiene  Community Resource Referral / Chronic Care Management: CRR required this visit?  No   CCM required this visit?  No      Plan:     I have personally reviewed and noted the following in the patient's chart:   . Medical and social history . Use of alcohol, tobacco or illicit drugs  . Current medications and supplements . Functional ability and status . Nutritional status . Physical activity . Advanced directives . List of other physicians . Hospitalizations, surgeries, and ER visits in previous 12 months . Vitals . Screenings to include cognitive, depression, and falls . Referrals and appointments  In addition, I have reviewed and discussed with patient certain preventive protocols, quality metrics, and best practice recommendations. A written personalized care plan for preventive services as well as general preventive health recommendations were provided to patient.     Sandrea Hammond, LPN   [id number]   Nurse Notes: None

## 2020-12-10 ENCOUNTER — Telehealth: Payer: Self-pay

## 2020-12-10 DIAGNOSIS — H2513 Age-related nuclear cataract, bilateral: Secondary | ICD-10-CM | POA: Diagnosis not present

## 2020-12-10 DIAGNOSIS — E119 Type 2 diabetes mellitus without complications: Secondary | ICD-10-CM | POA: Diagnosis not present

## 2020-12-10 DIAGNOSIS — H401191 Primary open-angle glaucoma, unspecified eye, mild stage: Secondary | ICD-10-CM | POA: Diagnosis not present

## 2020-12-10 DIAGNOSIS — H401131 Primary open-angle glaucoma, bilateral, mild stage: Secondary | ICD-10-CM | POA: Diagnosis not present

## 2020-12-10 MED ORDER — HYDROCHLOROTHIAZIDE 12.5 MG PO CAPS: 12.5000 mg | ORAL_CAPSULE | Freq: Every day | ORAL | 6 refills | Status: DC

## 2020-12-10 MED ORDER — CLOPIDOGREL BISULFATE 75 MG PO TABS: 75.0000 mg | ORAL_TABLET | Freq: Every day | ORAL | 2 refills | Status: DC

## 2020-12-10 MED ORDER — CLOPIDOGREL BISULFATE 75 MG PO TABS: 75.0000 mg | ORAL_TABLET | Freq: Every day | ORAL | 11 refills | Status: DC

## 2020-12-10 MED ORDER — HYDROCHLOROTHIAZIDE 12.5 MG PO CAPS: 12.5000 mg | ORAL_CAPSULE | Freq: Every day | ORAL | 2 refills | Status: DC

## 2020-12-10 MED ORDER — FLUTICASONE PROPIONATE 50 MCG/ACT NA SUSP
1.0000 | Freq: Every day | NASAL | 3 refills | Status: DC
  Filled 2021-11-24: qty 16, 60d supply, fill #0

## 2020-12-10 MED ORDER — METOPROLOL TARTRATE 25 MG PO TABS: 25.0000 mg | ORAL_TABLET | Freq: Two times a day (BID) | ORAL | 2 refills | Status: DC

## 2020-12-10 NOTE — Telephone Encounter (Signed)
Metformin did go to [facility], resent Flonase to [facility]

## 2020-12-18 NOTE — Telephone Encounter (Signed)
Left sleep study appointment details on voice mail. 

## 2020-12-31 ENCOUNTER — Encounter: Payer: Medicare Other | Admitting: Cardiovascular Disease

## 2021-01-08 ENCOUNTER — Ambulatory Visit: Payer: Medicare Other | Attending: General Practice | Admitting: Cardiovascular Disease

## 2021-01-08 ENCOUNTER — Other Ambulatory Visit: Payer: Self-pay

## 2021-01-08 DIAGNOSIS — G4733 Obstructive sleep apnea (adult) (pediatric): Secondary | ICD-10-CM | POA: Diagnosis not present

## 2021-01-08 DIAGNOSIS — R400 Somnolence: Secondary | ICD-10-CM | POA: Insufficient documentation

## 2021-01-20 NOTE — Progress Notes (Signed)
Cardiology Office Note   Date:  02/03/2021   ID:  Danielle Ellis, DOB Mar 06, 1956, MRN [id number]  PCP:  [name], FNP  Cardiologist:   Natacia Chaisson Martinique, MD   Chief Complaint  Patient presents with   Coronary Artery Disease       History of Present Illness: Danielle Ellis is a 65 y.o. female who presents for follow up CAD. She has a a PMH of hypertension, coronary artery disease status post NSTEMI 10/09/2020, type 2 diabetes, hyperlipidemia, generalized anxiety disorder, CABG x5 on 10/14/2020 (LIMA-LAD, SVG-diagonal, SVG-OM1/OM2, and SVG-PDA)   She was admitted to the hospital on 10/10/2020.  She reported several week history of recurrent episodes of substernal chest pain that would radiate to her left shoulder and left hand.  She also noted some associated shortness of breath diaphoresis and nausea.  She was noted to have mildly elevated troponins and her EKG showed diffuse ST depressions  with new T wave inversions inferiorly.  Echocardiogram showed normal LVEF with no significant valvular abnormalities.  She underwent cardiac catheterization which showed severe multivessel disease.  She underwent CABG x5 on 10/14/2020.   She maintained sinus rhythm.  Due to her NSTEMI she was placed on Plavix and her aspirin was decreased to 81 mg daily.   She was discharged in stable condition on 10/19/2020. When seen in March it was noted she had discontinued CPAP therapy several years before and sleep study was ordered. This was performed and BIPAP was recommended with follow up in sleep clinic. She has not received equipment yet.   She states she feels great. No chest pain or dyspnea. No palpitations, dizziness or edema. Tolerating medication well. BP readings are still mildly elevated.     Past Medical History:  Diagnosis Date   Anxiety    Hypertension    Sleep apnea     Past Surgical History:  Procedure Laterality Date   APPENDECTOMY     CORONARY ARTERY BYPASS GRAFT N/A 10/14/2020    Procedure: CORONARY ARTERY BYPASS GRAFTING (CABG) X FIVE, USING LEFT INTERNAL MAMMARY ARTERY AND BILATERAL LEGS GREATER SAPHENOUS VEINS HARVESTED ENDOSCOPICALLY;  Surgeon: Gaye Pollack, MD;  Location: [facility];  Service: Open Heart Surgery;  Laterality: N/A;   L ankle surgery     LEFT HEART CATH AND CORONARY ANGIOGRAPHY N/A 10/10/2020   Procedure: LEFT HEART CATH AND CORONARY ANGIOGRAPHY;  Surgeon: Martinique, Deby Adger M, MD;  Location: [facility];  Service: Cardiovascular;  Laterality: N/A;   TEE WITHOUT CARDIOVERSION N/A 10/14/2020   Procedure: TRANSESOPHAGEAL ECHOCARDIOGRAM (TEE);  Surgeon: Gaye Pollack, MD;  Location: [facility];  Service: Open Heart Surgery;  Laterality: N/A;     Current Outpatient Medications  Medication Sig Dispense Refill   acetaminophen (TYLENOL) 325 MG tablet Take 650 mg by mouth every 6 (six) hours as needed.     aspirin EC 81 MG tablet Take 1 tablet (81 mg total) by mouth daily. Swallow whole. 90 tablet 3   blood glucose meter kit and supplies Dispense based on patient and insurance preference. Use up to four times daily as directed. (FOR ICD-10 E10.9, E11.9). 1 each 0   calcium gluconate 500 MG tablet Take 500 mg by mouth daily.     clopidogrel (PLAVIX) 75 MG tablet Take 1 tablet (75 mg total) by mouth daily. 90 tablet 2   escitalopram (LEXAPRO) 10 MG tablet Take 1 tablet (10 mg total) by mouth daily. 90 tablet 2   fluticasone (FLONASE)  50 MCG/ACT nasal spray Place 1 spray into both nostrils daily. 48 mL 3   glucose blood (ONETOUCH ULTRA) test strip Test BS 4 times daily Dx E11.9 400 each 3   latanoprost (XALATAN) 0.005 % ophthalmic solution Place 1 drop into both eyes at bedtime.     loratadine (CLARITIN) 10 MG tablet Take 10 mg by mouth daily.     losartan (COZAAR) 50 MG tablet Take 1 tablet (50 mg total) by mouth daily. 90 tablet 3   metFORMIN (GLUCOPHAGE) 500 MG tablet Take 1 tablet (500 mg total) by mouth daily with breakfast. 90 tablet 1   metoprolol succinate  (TOPROL-XL) 50 MG 24 hr tablet Take 1 tablet (50 mg total) by mouth daily. Take with or immediately following a meal. 90 tablet 3   Multiple Vitamin (MULTIVITAMIN) capsule Take 1 capsule by mouth daily.     rosuvastatin (CRESTOR) 10 MG tablet Take 1 tablet (10 mg total) by mouth daily. 90 tablet 3   valACYclovir (VALTREX) 1000 MG tablet TAKE 2 TABLETS BY MOUTH  TWICE DAILY 12 tablet 1   No current facility-administered medications for this visit.    Allergies:   Patient has no known allergies.    Social History:  The patient  reports that she has never smoked. She has never used smokeless tobacco. She reports that she does not drink alcohol and does not use drugs.   Family History:  The patient's family history includes Cancer in her mother; Diabetes in her mother; Heart attack in her mother; Kidney disease in her mother.    ROS:  Please see the history of present illness.   Otherwise, review of systems are positive for none.   All other systems are reviewed and negative.    PHYSICAL EXAM: VS:  BP (!) 148/92 (BP Location: Right Arm, Patient Position: Sitting, Cuff Size: Normal)   Pulse 68   Ht '5\' 1"'  (1.549 m)   Wt 196 lb 12.8 oz (89.3 kg)   SpO2 95%   BMI 37.19 kg/m  , BMI Body mass index is 37.19 kg/m. GEN: Well nourished, well developed, in no acute distress  HEENT: normal  Neck: no JVD, carotid bruits, or masses Cardiac: RRR; no murmurs, rubs, or gallops,no edema. Sternal incision has healed well.  Respiratory:  clear to auscultation bilaterally, normal work of breathing GI: soft, nontender, nondistended, + BS MS: no deformity or atrophy  Skin: warm and dry, no rash Neuro:  Strength and sensation are intact Psych: euthymic mood, full affect   EKG:  EKG is not ordered today. The ekg ordered today demonstrates N/A   Recent Labs: 10/09/2020: TSH 4.842 10/15/2020: Magnesium 2.3 02/02/2021: ALT 44; BUN 22; Creatinine, Ser 1.00; Hemoglobin 13.7; Platelets 210; Potassium 3.9;  Sodium 143    Lipid Panel    Component Value Date/Time   CHOL 187 02/02/2021 1045   TRIG 304 (H) 02/02/2021 1045   HDL 42 02/02/2021 1045   CHOLHDL 4.5 (H) 02/02/2021 1045   CHOLHDL 8.2 10/10/2020 0144   VLDL 57 (H) 10/10/2020 0144   LDLCALC 94 02/02/2021 1045      Wt Readings from Last 3 Encounters:  02/03/21 196 lb 12.8 oz (89.3 kg)  02/02/21 197 lb 6 oz (89.5 kg)  12/08/20 190 lb (86.2 kg)      Other studies Reviewed: Additional studies/ records that were reviewed today include: sleep study as noted in chart.    ASSESSMENT AND PLAN:  1.  Coronary artery disease-presented with NSTEMI. Found to  have multivessel CAD. Status post CABG x5 October 14, 2020 Continue Plavix x one year, aspirin, rosuvastatin Heart healthy low Increase physical activity slowly No angina currently   2. Essential hypertension-BP control is still not optimal Heart healthy low-sodium diet Increase physical activity  Will switch metoprolol tartrate to succinate 50 mg daily Stop HCTZ due to effect on triglycerides. Start losartan 50 mg daily.  Continue to monitor   3. Hyperlipidemia- intolerant to lipitor and pravastatin in the past. Tolerating Crestor 5 mg daily. Still not at goal LDL < 70. Now 94. Will increase Crestor to 10 mg daily. Repeat lab in 3 months. If triglycerides remain high would consider adding Vascepa. Heart healthy low-sodium high-fiber diet    4. OSA/snoring/daytime somnolence -abnormal sleep study. Will get her hooked in with Dr Claiborne Billings in sleep clinic and make sure her Bipap is ordered.    5. Type 2 diabetes-A1c 6.1%.  Continue Metformin Heart healthy low-sodium carb modified diet     Current medicines are reviewed at length with the patient today.  The patient does not have concerns regarding medicines.  The following changes have been made:  see above  Labs/ tests ordered today include:  No orders of the defined types were placed in this  encounter.    Disposition:   FU with me  in 6 months  Signed, Kinnick Maus Martinique, MD  02/03/2021 10:31 AM    [facility] [address], [place], [place], [address] Phone [phone], Fax [phone]

## 2021-01-23 ENCOUNTER — Encounter (HOSPITAL_COMMUNITY): Payer: Medicare Other

## 2021-01-31 ENCOUNTER — Encounter: Payer: Self-pay | Admitting: Cardiovascular Disease

## 2021-01-31 NOTE — Procedures (Signed)
[facility]        Patient Name: Mirjana, Tarleton Date: 01/08/2021 Gender: Female D.O.B: 1956-05-06 Age (years): 69 Referring Provider: Coletta Memos NP Height (inches): 61 Interpreting Physician: Shelva Majestic MD, ABSM Weight (lbs): 196 RPSGT: Peak, Robert BMI: 37 MRN: [id number] Neck Size: 15.00  CLINICAL INFORMATION Sleep Study Type: Split Night CPAP  Indication for sleep study: snoring  Epworth Sleepiness Score: 3  SLEEP STUDY TECHNIQUE As per the AASM Manual for the Scoring of Sleep and Associated Events v2.3 (April 2016) with a hypopnea requiring 4% desaturations.  The channels recorded and monitored were frontal, central and occipital EEG, electrooculogram (EOG), submentalis EMG (chin), nasal and oral airflow, thoracic and abdominal wall motion, anterior tibialis EMG, snore microphone, electrocardiogram, and pulse oximetry. Continuous positive airway pressure (CPAP) was initiated when the patient met split night criteria and was titrated according to treat sleep-disordered breathing.  MEDICATIONS ALPRAZolam (XANAX) 0.25 MG table blood glucose meter kit and supplies calcium gluconate 500 MG tablet clopidogrel (PLAVIX) 75 MG tablet escitalopram (LEXAPRO) 10 MG tablet fluticasone (FLONASE) 50 MCG/ACT nasal spray glucose blood (ONETOUCH ULTRA) test strip hydrochlorothiazide (MICROZIDE) 12.5 MG capsule latanoprost (XALATAN) 0.005 % ophthalmic solution loratadine (CLARITIN) 10 MG tablet metFORMIN (GLUCOPHAGE) 500 MG tablet metoprolol tartrate (LOPRESSOR) 25 MG tablet Multiple Vitamin (MULTIVITAMIN) capsule rosuvastatin (CRESTOR) 5 MG tablet valACYclovir (VALTREX) 1000 MG tablet   Medications self-administered by patient taken the night of the study : N/A  RESPIRATORY PARAMETERS Diagnostic Total AHI (/hr): 30.7 RDI (/hr): 30.7 OA Index (/hr): 19.2 CA Index (/hr): 0.9 REM AHI (/hr): 52.2 NREM AHI (/hr): 29.4 Supine AHI  (/hr): 86.2 Non-supine AHI (/hr): 9.6 Min O2 Sat (%): 77.00 Mean O2 (%): 94.10 Time below 88% (min): 2.6   Titration Optimal Pressure (cm):  AHI at Optimal Pressure (/hr): N/A Min O2 at Optimal Pressure (%): 89.00 Supine % at Optimal (%): N/A Sleep % at Optimal (%): N/A   SLEEP ARCHITECTURE The recording time for the entire night was 528.2 minutes.  During a baseline period of 293.7 minutes, the patient slept for 199.5 minutes in REM and nonREM, yielding a sleep efficiency of 67.9%. Sleep onset after lights out was 14.4 minutes with a REM latency of 208.0 minutes. The patient spent 4.26% of the night in stage N1 sleep, 84.71% in stage N2 sleep, 5.26% in stage N3 and 5.8% in REM.  During the titration period of 232.2 minutes, the patient slept for 92.0 minutes in REM and nonREM, yielding a sleep efficiency of 39.6%. Sleep onset after CPAP initiation was 122.8 minutes with a REM latency of 102.0 minutes. The patient spent 5.98% of the night in stage N1 sleep, 86.96% in stage N2 sleep, 0.00% in stage N3 and 7.1% in REM.  CARDIAC DATA The 2 lead EKG demonstrated sinus rhythm. The mean heart rate was 54.03 beats per minute. Other EKG findings include: None.  LEG MOVEMENT DATA The total Periodic Limb Movements of Sleep (PLMS) were 0. The PLMS index was 0.00 .  IMPRESSIONS - Severe obstructive sleep apnea occurred during the diagnostic portion of the study (AHI 30.7/hour). An optimal PAP pressure could not be selected for this patient based on the available study data. - No significant central sleep apnea occurred during the diagnostic portion of the study (CAI  0.9/hour). - Severe oxygen desaturation was  noted to a nadir of 77%.  - The patient snored with moderate snoring volume during the diagnostic portion of the study. - No cardiac abnormalities were noted during this study. - Clinically significant periodic limb movements did not occur during sleep.  DIAGNOSIS - Obstructive Sleep Apnea  (G47.33)  RECOMMENDATIONS - Recommend BiPAP titration given sub-optimal CPAP titration. Consider a trial BiPAP Auto with EPAP min of 5, PS of 4, and IPAP max of 25 cm of water.  - Effort should be made to optimize nasal and oropharyngeal patency. - Avoid alcohol, sedatives and other CNS depressants that may worsen sleep apnea and disrupt normal sleep architecture. - Sleep hygiene should be reviewed to assess factors that may improve sleep quality. - Weight management and regular exercise should be initiated or continued. - Recommend a download after 30 days and sleep clinic evaluation after 4 weeks of therapy  [Electronically signed] 01/31/2021 04:31 PM  Shelva Majestic MD, [facility], ABSM Diplomate, American Board of Sleep Medicine   NPI: [id number] [place] PH: [phone]   FX: [phone] [place]

## 2021-02-02 ENCOUNTER — Encounter: Payer: Self-pay | Admitting: Family

## 2021-02-02 ENCOUNTER — Other Ambulatory Visit: Payer: Self-pay

## 2021-02-02 ENCOUNTER — Ambulatory Visit (INDEPENDENT_AMBULATORY_CARE_PROVIDER_SITE_OTHER): Payer: Medicare Other | Admitting: Family

## 2021-02-02 ENCOUNTER — Ambulatory Visit: Payer: Medicare Other | Admitting: Cardiology

## 2021-02-02 VITALS — BP 139/77 | HR 68 | Temp 97.6°F | Ht 61.0 in | Wt 197.4 lb

## 2021-02-02 DIAGNOSIS — R7303 Prediabetes: Secondary | ICD-10-CM

## 2021-02-02 DIAGNOSIS — F320 Major depressive disorder, single episode, mild: Secondary | ICD-10-CM

## 2021-02-02 DIAGNOSIS — Z951 Presence of aortocoronary bypass graft: Secondary | ICD-10-CM

## 2021-02-02 DIAGNOSIS — I2510 Atherosclerotic heart disease of native coronary artery without angina pectoris: Secondary | ICD-10-CM | POA: Diagnosis not present

## 2021-02-02 DIAGNOSIS — I10 Essential (primary) hypertension: Secondary | ICD-10-CM | POA: Diagnosis not present

## 2021-02-02 DIAGNOSIS — E119 Type 2 diabetes mellitus without complications: Secondary | ICD-10-CM

## 2021-02-02 DIAGNOSIS — E785 Hyperlipidemia, unspecified: Secondary | ICD-10-CM | POA: Diagnosis not present

## 2021-02-02 DIAGNOSIS — F411 Generalized anxiety disorder: Secondary | ICD-10-CM

## 2021-02-02 LAB — CMP14+EGFR
ALT: 44 IU/L — ABNORMAL HIGH (ref 0–32)
AST: 34 IU/L (ref 0–40)
Albumin/Globulin Ratio: 2 (ref 1.2–2.2)
Albumin: 4.8 g/dL (ref 3.8–4.8)
Alkaline Phosphatase: 192 IU/L — ABNORMAL HIGH (ref 44–121)
BUN/Creatinine Ratio: 22 (ref 12–28)
BUN: 22 mg/dL (ref 8–27)
Bilirubin Total: 0.4 mg/dL (ref 0.0–1.2)
CO2: 25 mmol/L (ref 20–29)
Calcium: 10 mg/dL (ref 8.7–10.3)
Chloride: 102 mmol/L (ref 96–106)
Creatinine, Ser: 1 mg/dL (ref 0.57–1.00)
Globulin, Total: 2.4 g/dL (ref 1.5–4.5)
Glucose: 108 mg/dL — ABNORMAL HIGH (ref 65–99)
Potassium: 3.9 mmol/L (ref 3.5–5.2)
Sodium: 143 mmol/L (ref 134–144)
Total Protein: 7.2 g/dL (ref 6.0–8.5)
eGFR: 62 mL/min/{1.73_m2} (ref >59)

## 2021-02-02 LAB — CBC WITH DIFFERENTIAL/PLATELET
Basophils Absolute: 0 10*3/uL (ref 0.0–0.2)
Basos: 1 %
EOS (ABSOLUTE): 0.1 10*3/uL (ref 0.0–0.4)
Eos: 2 %
Hematocrit: 43.8 % (ref 34.0–46.6)
Hemoglobin: 13.7 g/dL (ref 11.1–15.9)
Immature Grans (Abs): 0 10*3/uL (ref 0.0–0.1)
Immature Granulocytes: 0 %
Lymphocytes Absolute: 2 10*3/uL (ref 0.7–3.1)
Lymphs: 34 %
MCH: 25.9 pg — ABNORMAL LOW (ref 26.6–33.0)
MCHC: 31.3 g/dL — ABNORMAL LOW (ref 31.5–35.7)
MCV: 83 fL (ref 79–97)
Monocytes Absolute: 0.4 10*3/uL (ref 0.1–0.9)
Monocytes: 7 %
Neutrophils Absolute: 3.3 10*3/uL (ref 1.4–7.0)
Neutrophils: 56 %
Platelets: 210 10*3/uL (ref 150–450)
RBC: 5.29 x10E6/uL — ABNORMAL HIGH (ref 3.77–5.28)
RDW: 14.5 % (ref 11.7–15.4)
WBC: 6 10*3/uL (ref 3.4–10.8)

## 2021-02-02 LAB — LIPID PANEL
Chol/HDL Ratio: 4.5 ratio — ABNORMAL HIGH (ref 0.0–4.4)
Cholesterol, Total: 187 mg/dL (ref 100–199)
HDL: 42 mg/dL (ref >39)
LDL Chol Calc (NIH): 94 mg/dL (ref 0–99)
Triglycerides: 304 mg/dL — ABNORMAL HIGH (ref 0–149)
VLDL Cholesterol Cal: 51 mg/dL — ABNORMAL HIGH (ref 5–40)

## 2021-02-02 LAB — BAYER DCA HB A1C WAIVED: HB A1C (BAYER DCA - WAIVED): 5.8 % (ref <7.0)

## 2021-02-02 NOTE — Patient Instructions (Signed)
Health Maintenance, Female Adopting a healthy lifestyle and getting preventive care are important in promoting health and wellness. Ask your health care provider about: The right schedule for you to have regular tests and exams. Things you can do on your own to prevent diseases and keep yourself healthy. What should I know about diet, weight, and exercise? Eat a healthy diet  Eat a diet that includes plenty of vegetables, fruits, low-fat dairy products, and lean protein. Do not eat a lot of foods that are high in solid fats, added sugars, or sodium.  Maintain a healthy weight Body mass index (BMI) is used to identify weight problems. It estimates body fat based on height and weight. Your health care provider can help determineyour BMI and help you achieve or maintain a healthy weight. Get regular exercise Get regular exercise. This is one of the most important things you can do for your health. Most adults should: Exercise for at least 150 minutes each week. The exercise should increase your heart rate and make you sweat (moderate-intensity exercise). Do strengthening exercises at least twice a week. This is in addition to the moderate-intensity exercise. Spend less time sitting. Even light physical activity can be beneficial. Watch cholesterol and blood lipids Have your blood tested for lipids and cholesterol at 67 years of age, then havethis test every 5 years. Have your cholesterol levels checked more often if: Your lipid or cholesterol levels are high. You are older than 67 years of age. You are at high risk for heart disease. What should I know about cancer screening? Depending on your health history and family history, you may need to have cancer screening at various ages. This may include screening for: Breast cancer. Cervical cancer. Colorectal cancer. Skin cancer. Lung cancer. What should I know about heart disease, diabetes, and high blood pressure? Blood pressure and heart  disease High blood pressure causes heart disease and increases the risk of stroke. This is more likely to develop in people who have high blood pressure readings, are of African descent, or are overweight. Have your blood pressure checked: Every 3-5 years if you are 18-39 years of age. Every year if you are 40 years old or older. Diabetes Have regular diabetes screenings. This checks your fasting blood sugar level. Have the screening done: Once every three years after age 40 if you are at a normal weight and have a low risk for diabetes. More often and at a younger age if you are overweight or have a high risk for diabetes. What should I know about preventing infection? Hepatitis B If you have a higher risk for hepatitis B, you should be screened for this virus. Talk with your health care provider to find out if you are at risk forhepatitis B infection. Hepatitis C Testing is recommended for: Everyone born from 1945 through 1965. Anyone with known risk factors for hepatitis C. Sexually transmitted infections (STIs) Get screened for STIs, including gonorrhea and chlamydia, if: You are sexually active and are younger than 67 years of age. You are older than 67 years of age and your health care provider tells you that you are at risk for this type of infection. Your sexual activity has changed since you were last screened, and you are at increased risk for chlamydia or gonorrhea. Ask your health care provider if you are at risk. Ask your health care provider about whether you are at high risk for HIV. Your health care provider may recommend a prescription medicine to help   prevent HIV infection. If you choose to take medicine to prevent HIV, you should first get tested for HIV. You should then be tested every 3 months for as long as you are taking the medicine. Pregnancy If you are about to stop having your period (premenopausal) and you may become pregnant, seek counseling before you get  pregnant. Take 400 to 800 micrograms (mcg) of folic acid every day if you become pregnant. Ask for birth control (contraception) if you want to prevent pregnancy. Osteoporosis and menopause Osteoporosis is a disease in which the bones lose minerals and strength with aging. This can result in bone fractures. If you are 65 years old or older, or if you are at risk for osteoporosis and fractures, ask your health care provider if you should: Be screened for bone loss. Take a calcium or vitamin D supplement to lower your risk of fractures. Be given hormone replacement therapy (HRT) to treat symptoms of menopause. Follow these instructions at home: Lifestyle Do not use any products that contain nicotine or tobacco, such as cigarettes, e-cigarettes, and chewing tobacco. If you need help quitting, ask your health care provider. Do not use street drugs. Do not share needles. Ask your health care provider for help if you need support or information about quitting drugs. Alcohol use Do not drink alcohol if: Your health care provider tells you not to drink. You are pregnant, may be pregnant, or are planning to become pregnant. If you drink alcohol: Limit how much you use to 0-1 drink a day. Limit intake if you are breastfeeding. Be aware of how much alcohol is in your drink. In the [place] one drink equals one 12 oz bottle of beer (355 mL), one 5 oz glass of wine (148 mL), or one 1 oz glass of hard liquor (44 mL). General instructions Schedule regular health, dental, and eye exams. Stay current with your vaccines. Tell your health care provider if: You often feel depressed. You have ever been abused or do not feel safe at home. Summary Adopting a healthy lifestyle and getting preventive care are important in promoting health and wellness. Follow your health care provider's instructions about healthy diet, exercising, and getting tested or screened for diseases. Follow your health care provider's  instructions on monitoring your cholesterol and blood pressure. This information is not intended to replace advice given to you by your health care provider. Make sure you discuss any questions you have with your healthcare provider. Document Revised: 07/26/2018 Document Reviewed: 07/26/2018 Elsevier Patient Education  2022 Elsevier Inc.  

## 2021-02-02 NOTE — Progress Notes (Signed)
Subjective:    Patient ID: Danielle Ellis, female    DOB: [birth year], 65 y.o.   MRN: [id number]  Chief Complaint  Patient presents with   Medical Management of Chronic Issues   Hypertension   Hyperlipidemia   Diabetes   Pt present to the office today chronic follow up. She is followed by Cardiologists for NSTEMI on 10/09/20.  Hypertension This is a chronic problem. The current episode started more than 1 year ago. The problem has been resolved since onset. The problem is controlled. Associated symptoms include anxiety and blurred vision. Pertinent negatives include no malaise/fatigue, peripheral edema or shortness of breath. Risk factors for coronary artery disease include dyslipidemia, diabetes mellitus, obesity and sedentary lifestyle. The current treatment provides moderate improvement. Hypertensive end-organ damage includes CAD/MI.  Hyperlipidemia This is a chronic problem. The current episode started more than 1 year ago. Exacerbating diseases include obesity. Pertinent negatives include no shortness of breath. Current antihyperlipidemic treatment includes statins. The current treatment provides moderate improvement of lipids. Risk factors for coronary artery disease include dyslipidemia, diabetes mellitus, hypertension and a sedentary lifestyle.  Diabetes She presents for her follow-up diabetic visit. She has type 2 diabetes mellitus. Hypoglycemia symptoms include nervousness/anxiousness. Associated symptoms include blurred vision. Pertinent negatives for diabetes include no foot paresthesias. Symptoms are stable. Diabetic complications include heart disease. Risk factors for coronary artery disease include dyslipidemia, diabetes mellitus, hypertension and sedentary lifestyle. She is following a generally unhealthy diet. Her overall blood glucose range is 110-130 mg/dl. An ACE inhibitor/angiotensin II receptor blocker is being taken.  Anxiety Presents for follow-up visit. Symptoms  include depressed mood, excessive worry, irritability, nervous/anxious behavior and restlessness. Patient reports no shortness of breath. Symptoms occur most days. The severity of symptoms is moderate.    Depression        This is a chronic problem.  The current episode started more than 1 year ago.   The onset quality is gradual.   The problem occurs intermittently.  Associated symptoms include helplessness, hopelessness, irritable and restlessness.  Past treatments include SSRIs - Selective serotonin reuptake inhibitors.  Compliance with treatment is good.  Past medical history includes anxiety.      Review of Systems  Constitutional:  Positive for irritability. Negative for malaise/fatigue.  Eyes:  Positive for blurred vision.  Respiratory:  Negative for shortness of breath.   Psychiatric/Behavioral:  Positive for depression. The patient is nervous/anxious.   All other systems reviewed and are negative.     Objective:   Physical Exam Vitals reviewed.  Constitutional:      General: She is irritable. She is not in acute distress.    Appearance: She is well-developed.  HENT:     Head: Normocephalic and atraumatic.     Right Ear: Tympanic membrane normal.     Left Ear: Tympanic membrane normal.  Eyes:     Pupils: Pupils are equal, round, and reactive to light.  Neck:     Thyroid: No thyromegaly.  Cardiovascular:     Rate and Rhythm: Normal rate and regular rhythm.     Heart sounds: Normal heart sounds. No murmur heard. Pulmonary:     Effort: Pulmonary effort is normal. No respiratory distress.     Breath sounds: Normal breath sounds. No wheezing.  Abdominal:     General: Bowel sounds are normal. There is no distension.     Palpations: Abdomen is soft.     Tenderness: There is no abdominal tenderness.  Musculoskeletal:  General: No tenderness. Normal range of motion.     Cervical back: Normal range of motion and neck supple.  Skin:    General: Skin is warm and dry.   Neurological:     Mental Status: She is alert and oriented to person, place, and time.     Cranial Nerves: No cranial nerve deficit.     Deep Tendon Reflexes: Reflexes are normal and symmetric.  Psychiatric:        Behavior: Behavior normal.        Thought Content: Thought content normal.        Judgment: Judgment normal.   Diabetic Foot Exam - Simple   Simple Foot Form Diabetic Foot exam was performed with the following findings: Yes 02/02/2021  8:53 AM  Visual Inspection No deformities, no ulcerations, no other skin breakdown bilaterally: Yes Sensation Testing Intact to touch and monofilament testing bilaterally: Yes Pulse Check Posterior Tibialis and Dorsalis pulse intact bilaterally: Yes Comments      BP 139/77   Pulse 68   Temp 97.6 F (36.4 C) (Temporal)   Ht _0  (1.549 m)   Wt 197 lb 6 oz (89.5 kg)   BMI 37.29 kg/m      Assessment & Plan:  Danielle Ellis comes in today with chief complaint of Medical Management of Chronic Issues, Hypertension, Hyperlipidemia, and Diabetes   Diagnosis and orders addressed:  1. Type 2 diabetes mellitus without complication, without long-term current use of insulin (HCC) - CBC with Differential/Platelet - Bayer DCA Hb A1c Waived  2. Hypertension, unspecified type  - CMP14+EGFR  3. Hyperlipidemia, unspecified hyperlipidemia type - Lipid panel  4. Coronary artery disease involving native coronary artery of native heart without angina pectoris  5. Depression, major, single episode, mild ([place])   6. S/P CABG x 5  7. GAD (generalized anxiety disorder)  8. Prediabetes    Labs pending Health Maintenance reviewed Diet and exercise encouraged  Follow up plan: 6 months   Evelina Dun, FNP

## 2021-02-03 ENCOUNTER — Encounter: Payer: Self-pay | Admitting: Cardiology

## 2021-02-03 ENCOUNTER — Ambulatory Visit: Payer: Medicare Other | Admitting: Cardiology

## 2021-02-03 ENCOUNTER — Other Ambulatory Visit: Payer: Self-pay | Admitting: Cardiovascular Disease

## 2021-02-03 ENCOUNTER — Telehealth: Payer: Self-pay | Admitting: *Deleted

## 2021-02-03 VITALS — BP 148/92 | HR 68 | Ht 61.0 in | Wt 196.8 lb

## 2021-02-03 DIAGNOSIS — I10 Essential (primary) hypertension: Secondary | ICD-10-CM | POA: Diagnosis not present

## 2021-02-03 DIAGNOSIS — E782 Mixed hyperlipidemia: Secondary | ICD-10-CM

## 2021-02-03 DIAGNOSIS — G4733 Obstructive sleep apnea (adult) (pediatric): Secondary | ICD-10-CM | POA: Diagnosis not present

## 2021-02-03 DIAGNOSIS — E119 Type 2 diabetes mellitus without complications: Secondary | ICD-10-CM

## 2021-02-03 DIAGNOSIS — I2510 Atherosclerotic heart disease of native coronary artery without angina pectoris: Secondary | ICD-10-CM | POA: Diagnosis not present

## 2021-02-03 MED ORDER — ASPIRIN EC 81 MG PO TBEC: 81.0000 mg | DELAYED_RELEASE_TABLET | Freq: Every day | ORAL | 3 refills | Status: DC

## 2021-02-03 MED ORDER — METOPROLOL SUCCINATE ER 50 MG PO TB24
50.0000 mg | ORAL_TABLET | Freq: Every day | ORAL | 3 refills | Status: DC
  Filled 2021-10-26: qty 90, 90d supply, fill #0

## 2021-02-03 MED ORDER — LOSARTAN POTASSIUM 50 MG PO TABS: 50.0000 mg | ORAL_TABLET | Freq: Every day | ORAL | 3 refills | Status: DC

## 2021-02-03 MED ORDER — ROSUVASTATIN CALCIUM 10 MG PO TABS
10.0000 mg | ORAL_TABLET | Freq: Every day | ORAL | 3 refills | Status: DC
  Filled 2021-10-26: qty 90, 90d supply, fill #0

## 2021-02-03 NOTE — Telephone Encounter (Signed)
Patient notified of sleep study results and recommendations. She agrees to proceed with having BIPAP titration scheduled for June 28th.

## 2021-02-03 NOTE — Patient Instructions (Signed)
We will make sure you get follow up in the sleep clinic and get your equipment  Increase Crestor to 10 mg daily  We will switch metoprolol to Toprol XL 50 mg daily  Stop HCTZ  Start losartan 50 mg daily for blood pressure  We will repeat fasting lab in 3 months.

## 2021-02-03 NOTE — Telephone Encounter (Signed)
-----   Message from Lennette Bihari, MD sent at 01/31/2021  4:39 PM EDT ----- Burna Mortimer, please notify pt; inadequate CPAP;  try BiPAP titration study or BiPAP Auto

## 2021-02-05 ENCOUNTER — Other Ambulatory Visit: Payer: Self-pay | Admitting: Family

## 2021-02-09 ENCOUNTER — Ambulatory Visit: Payer: Medicare Other | Admitting: Cardiology

## 2021-02-10 ENCOUNTER — Other Ambulatory Visit: Payer: Self-pay

## 2021-02-10 ENCOUNTER — Ambulatory Visit: Payer: Medicare Other | Attending: Cardiovascular Disease | Admitting: Cardiovascular Disease

## 2021-02-10 DIAGNOSIS — G4733 Obstructive sleep apnea (adult) (pediatric): Secondary | ICD-10-CM | POA: Diagnosis not present

## 2021-02-12 ENCOUNTER — Encounter: Payer: Self-pay | Admitting: Cardiovascular Disease

## 2021-02-12 NOTE — Procedures (Signed)
[facility]       Patient Name: Danielle Ellis, Danielle Ellis Date: 02/10/2021 Gender: Female D.O.B: [birth year] Age (years): 50 Referring Provider: Coletta Memos NP Height (inches): 61 Interpreting Physician: Shelva Majestic MD, ABSM Weight (lbs): 196 RPSGT: Rosebud Poles BMI: 37 MRN: [id number] Neck Size: 15.00  CLINICAL INFORMATION The patient is referred for a BiPAP titration to treat sleep apnea.  Date of Split Night:  01/08/2021:  AHI 30.7/h; REM AHI 52.2/h; supine sleep AHI 86.2/h; O2 nadir 77%.  SLEEP STUDY TECHNIQUE As per the AASM Manual for the Scoring of Sleep and Associated Events v2.3 (April 2016) with a hypopnea requiring 4% desaturations.  The channels recorded and monitored were frontal, central and occipital EEG, electrooculogram (EOG), submentalis EMG (chin), nasal and oral airflow, thoracic and abdominal wall motion, anterior tibialis EMG, snore microphone, electrocardiogram, and pulse oximetry. Bilevel positive airway pressure (BPAP) was initiated at the beginning of the study and titrated to treat sleep-disordered breathing.  MEDICATIONS acetaminophen (TYLENOL) 325 MG tablet aspirin EC 81 MG tablet blood glucose meter kit and supplies calcium gluconate 500 MG tablet clopidogrel (PLAVIX) 75 MG tablet escitalopram (LEXAPRO) 10 MG tablet fluticasone (FLONASE) 50 MCG/ACT nasal spray glucose blood (ONETOUCH ULTRA) test strip latanoprost (XALATAN) 0.005 % ophthalmic solution loratadine (CLARITIN) 10 MG tablet losartan (COZAAR) 50 MG tablet metFORMIN (GLUCOPHAGE) 500 MG tablet metoprolol succinate (TOPROL-XL) 50 MG 24 hr tablet Multiple Vitamin (MULTIVITAMIN) capsule rosuvastatin (CRESTOR) 10 MG tablet valACYclovir (VALTREX) 1000 MG tablet Medications self-administered by patient taken the night of the study : N/A  RESPIRATORY PARAMETERS Optimal IPAP Pressure (cm): 16 AHI at Optimal Pressure (/hr) 1.3 Optimal EPAP Pressure  (cm): 12   Overall Minimal O2 (%): 88.00 Minimal O2 at Optimal Pressure (%): 92.0  SLEEP ARCHITECTURE Start Time: 11:01:39 PM Stop Time: 5:54:58 AM Total Time (min): 413.3 Total Sleep Time (min): 300 Sleep Latency (min): 55.3 Sleep Efficiency (%): 72.6 REM Latency (min): 117.5 WASO (min): 58.1 Stage N1 (%): 3.83 Stage N2 (%): 34.50 Stage N3 (%): 37.50 Stage R (%): 24.2 Supine (%): 29.83 Arousal Index (/hr): 5.2   CARDIAC DATA The 2 lead EKG demonstrated sinus rhythm. The mean heart rate was 51.83 beats per minute. Other EKG findings include: PVCs.  LEG MOVEMENT DATA The total Periodic Limb Movements of Sleep (PLMS) were 37. The PLMS index was 7.40. A PLMS index of <15 is considered normal in adults.  IMPRESSIONS - BiPAP was initiated at 8/4 and was titrated to optimal PAP pressure at 16 /12  cm of water); AHI 1.3/h; O2 nadir 92%. - Central sleep apnea was not noted during this titration (CAI = 2.8/h). - Mild oxygen desaturations to a nadir of 88.00% at 13/9 cm of water.  - The patient snored with moderate snoring volume. - 2-lead EKG demonstrated: PVCs - Mild periodic limb movements were observed during this study. Arousals associated with PLMs were rare.  DIAGNOSIS - Obstructive Sleep Apnea (G47.33)  RECOMMENDATIONS - Recommend an initial trial of BiPAP therapy with EPR at 16/12 cm H2O with heated humidification.  A small size Philips Respironics Full Face Mask Dreamwear mask was used for the titration study.  - Effort should be made to optimize nasal and oropharyngeal patency. - Avoid alcohol, sedatives and other CNS depressants that may worsen sleep apnea and disrupt normal sleep architecture. - Sleep hygiene should be reviewed to assess factors that may improve sleep  quality. - Weight management (BMI 37) and regular exercise should be initiated or continued. - Recommend a download in 30 days and sleep clinic evaluation after 4 weeks of therapy.  [Electronically signed]  02/12/2021 10:12 PM  Shelva Majestic MD, [facility], [place], American Board of Sleep Medicine   NPI: [id number] [place] PH: [phone]   FX: [phone] [place]

## 2021-02-17 ENCOUNTER — Ambulatory Visit: Payer: Medicare Other

## 2021-02-23 ENCOUNTER — Telehealth: Payer: Self-pay | Admitting: *Deleted

## 2021-02-23 NOTE — Telephone Encounter (Signed)
Left message to return a call to inform me if she has a preference of where she wants her BIPAP order to go.

## 2021-02-23 NOTE — Telephone Encounter (Signed)
-----   Message from Lennette Bihari, MD sent at 02/12/2021 10:20 PM EDT ----- Burna Mortimer, plese notify pt and set up BiPAP initiation with DME

## 2021-02-24 ENCOUNTER — Ambulatory Visit
Admission: RE | Admit: 2021-02-24 | Discharge: 2021-02-24 | Disposition: A | Discharge status: Home / Self Care | Payer: Medicare Other | Source: Ambulatory Visit | Attending: Obstetrics & Gynecology | Admitting: Obstetrics & Gynecology

## 2021-02-24 ENCOUNTER — Other Ambulatory Visit: Payer: Self-pay

## 2021-02-24 DIAGNOSIS — Z1231 Encounter for screening mammogram for malignant neoplasm of breast: Secondary | ICD-10-CM | POA: Diagnosis not present

## 2021-02-26 DIAGNOSIS — G4733 Obstructive sleep apnea (adult) (pediatric): Secondary | ICD-10-CM | POA: Diagnosis not present

## 2021-03-11 DIAGNOSIS — H2513 Age-related nuclear cataract, bilateral: Secondary | ICD-10-CM | POA: Diagnosis not present

## 2021-03-11 DIAGNOSIS — H401131 Primary open-angle glaucoma, bilateral, mild stage: Secondary | ICD-10-CM | POA: Diagnosis not present

## 2021-03-29 DIAGNOSIS — G4733 Obstructive sleep apnea (adult) (pediatric): Secondary | ICD-10-CM | POA: Diagnosis not present

## 2021-04-29 DIAGNOSIS — G4733 Obstructive sleep apnea (adult) (pediatric): Secondary | ICD-10-CM | POA: Diagnosis not present

## 2021-05-01 ENCOUNTER — Other Ambulatory Visit: Payer: Self-pay | Admitting: Family

## 2021-05-01 DIAGNOSIS — R7303 Prediabetes: Secondary | ICD-10-CM

## 2021-05-04 NOTE — Progress Notes (Signed)
Cardiology Office Note:    Date:  05/05/2021   ID:  Danielle Ellis, DOB 1955-11-06, MRN [id number]  PCP:  [name], FNP  Cardiologist:  Peter Martinique, MD   Referring MD: [name], FNP   Chief Complaint  Patient presents with   Follow-up    OSA, HTN    History of Present Illness:    Danielle Ellis is a 65 y.o. female with a hx of CAD, hypertension, DM 2, hyperlipidemia, and generalized anxiety disorder.  She is status post CABG x5 on 10/14/2020 with LIMA-LAD, SVG-diagonal, SVG-OM1/OM 2, and SVG-PDA).  Because she presented as an NSTEMI she was placed on Plavix and her aspirin was decreased to 81 mg daily.  She remained in sinus rhythm.  She was referred back to Dr. Claiborne Billings for reinstatement of BiPAP therapy.  She was last seen by Dr. Martinique on 02/03/2021 and feeling well at that time.  She presents today for follow up of new BIPAP. She is 100% compliant. She feels much better on the BIPAP with significantly reduced daytime fatigue. Her only complaint is dry mouth in the morning. [name] will adjust her humidification remotely.  She brings BP log with SBP in the 140s. We discussed better control and she agrees. Losartan increased as below.    Past Medical History:  Diagnosis Date   Anxiety    Hypertension    Sleep apnea     Past Surgical History:  Procedure Laterality Date   APPENDECTOMY     CORONARY ARTERY BYPASS GRAFT N/A 10/14/2020   Procedure: CORONARY ARTERY BYPASS GRAFTING (CABG) X FIVE, USING LEFT INTERNAL MAMMARY ARTERY AND BILATERAL LEGS GREATER SAPHENOUS VEINS HARVESTED ENDOSCOPICALLY;  Surgeon: Gaye Pollack, MD;  Location: [facility];  Service: Open Heart Surgery;  Laterality: N/A;   L ankle surgery     LEFT HEART CATH AND CORONARY ANGIOGRAPHY N/A 10/10/2020   Procedure: LEFT HEART CATH AND CORONARY ANGIOGRAPHY;  Surgeon: Martinique, Peter M, MD;  Location: [facility];  Service: Cardiovascular;  Laterality: N/A;   TEE WITHOUT CARDIOVERSION N/A 10/14/2020    Procedure: TRANSESOPHAGEAL ECHOCARDIOGRAM (TEE);  Surgeon: Gaye Pollack, MD;  Location: [facility];  Service: Open Heart Surgery;  Laterality: N/A;    Current Medications: Current Meds  Medication Sig   acetaminophen (TYLENOL) 325 MG tablet Take 650 mg by mouth every 6 (six) hours as needed.   aspirin EC 81 MG tablet Take 1 tablet (81 mg total) by mouth daily. Swallow whole.   blood glucose meter kit and supplies Dispense based on patient and insurance preference. Use up to four times daily as directed. (FOR ICD-10 E10.9, E11.9).   calcium gluconate 500 MG tablet Take 500 mg by mouth daily.   clopidogrel (PLAVIX) 75 MG tablet Take 1 tablet (75 mg total) by mouth daily.   escitalopram (LEXAPRO) 10 MG tablet Take 1 tablet (10 mg total) by mouth daily.   fluticasone (FLONASE) 50 MCG/ACT nasal spray Place 1 spray into both nostrils daily.   glucose blood (ONETOUCH ULTRA) test strip Test BS 4 times daily Dx E11.9   latanoprost (XALATAN) 0.005 % ophthalmic solution Place 1 drop into both eyes at bedtime.   loratadine (CLARITIN) 10 MG tablet Take 10 mg by mouth daily.   losartan (COZAAR) 50 MG tablet Take 1 tablet (50 mg total) by mouth daily.   metFORMIN (GLUCOPHAGE) 500 MG tablet TAKE 1 TABLET BY MOUTH  DAILY WITH BREAKFAST   metoprolol succinate (TOPROL-XL) 50  MG 24 hr tablet Take 1 tablet (50 mg total) by mouth daily. Take with or immediately following a meal.   Multiple Vitamin (MULTIVITAMIN) capsule Take 1 capsule by mouth daily.   rosuvastatin (CRESTOR) 10 MG tablet Take 1 tablet (10 mg total) by mouth daily.   valACYclovir (VALTREX) 1000 MG tablet TAKE 2 TABLETS BY MOUTH  TWICE DAILY     Allergies:   Patient has no known allergies.   Social History   Socioeconomic History   Marital status: Married    Spouse name: Not on file   Number of children: 1   Years of education: Not on file   Highest education level: Not on file  Occupational History    Comment: retired  Tobacco Use    Smoking status: Never   Smokeless tobacco: Never  Vaping Use   Vaping Use: Never used  Substance and Sexual Activity   Alcohol use: No   Drug use: No   Sexual activity: Yes    Partners: Male  Other Topics Concern   Not on file  Social History Narrative   Lives with her husband. They have one daughter, Danielle Ellis - She lives 10 minutes away.   Social Determinants of Health   Financial Resource Strain: Low Risk    Difficulty of Paying Living Expenses: Not hard at all  Food Insecurity: No Food Insecurity   Worried About Charity fundraiser in the Last Year: Never true   [place] in the Last Year: Never true  Transportation Needs: No Transportation Needs   Lack of Transportation (Medical): No   Lack of Transportation (Non-Medical): No  Physical Activity: Sufficiently Active   Days of Exercise per Week: 7 days   Minutes of Exercise per Session: 50 min  Stress: No Stress Concern Present   Feeling of Stress : Only a little  Social Connections: Engineer, building services of Communication with Friends and Family: More than three times a week   Frequency of Social Gatherings with Friends and Family: Once a week   Attends Religious Services: More than 4 times per year   Active Member of [employer] or Organizations: No   Attends Music therapist: More than 4 times per year   Marital Status: Married     Family History: The patient's family history includes Cancer in her mother; Diabetes in her mother; Heart attack in her mother; Kidney disease in her mother.  ROS:   Please see the history of present illness.     All other systems reviewed and are negative.  EKGs/Labs/Other Studies Reviewed:    The following studies were reviewed today:  Left heart cath 10/10/20: Prox LAD to Mid LAD lesion is 60% stenosed. Mid LAD lesion is 60% stenosed. Prox Cx to Mid Cx lesion is 85% stenosed. 1st Diag lesion is 90% stenosed. Prox RCA lesion is 90% stenosed. Mid RCA to Dist  RCA lesion is 99% stenosed. The left ventricular systolic function is normal. LV end diastolic pressure is normal. The left ventricular ejection fraction is 55-65% by visual estimate.   1. 3 vessel obstructive CAD   - long 60% areas of stenosis in the proximal and mid LAD with abnormal RFR of 0.85   - 90% ostial first diagonal   -85% proximal to mid LCx   - 90% proximal RCA. Long 99% mid RCA 2. Normal LV function 3. Normal LVEDP   Plan: recommend CT surgery consultation for CABG  EKG:  EKG is not  ordered today.  Recent Labs: 10/09/2020: TSH 4.842 10/15/2020: Magnesium 2.3 02/02/2021: ALT 44; BUN 22; Creatinine, Ser 1.00; Hemoglobin 13.7; Platelets 210; Potassium 3.9; Sodium 143  Recent Lipid Panel    Component Value Date/Time   CHOL 187 02/02/2021 1045   TRIG 304 (H) 02/02/2021 1045   HDL 42 02/02/2021 1045   CHOLHDL 4.5 (H) 02/02/2021 1045   CHOLHDL 8.2 10/10/2020 0144   VLDL 57 (H) 10/10/2020 0144   LDLCALC 94 02/02/2021 1045    Physical Exam:    VS:  BP (!) 160/72   Pulse 73   Ht _0  (1.549 m)   Wt 209 lb 9.6 oz (95.1 kg)   SpO2 97%   BMI 39.60 kg/m     Wt Readings from Last 3 Encounters:  05/05/21 209 lb 9.6 oz (95.1 kg)  02/03/21 196 lb 12.8 oz (89.3 kg)  02/02/21 197 lb 6 oz (89.5 kg)     GEN:  Well nourished, well developed in no acute distress HEENT: Normal NECK: No JVD; No carotid bruits LYMPHATICS: No lymphadenopathy CARDIAC: RRR, no murmurs, rubs, gallops RESPIRATORY:  Clear to auscultation without rales, wheezing or rhonchi  ABDOMEN: Soft, non-tender, non-distended MUSCULOSKELETAL:  No edema; No deformity  SKIN: Warm and dry NEUROLOGIC:  Alert and oriented x 3 PSYCHIATRIC:  Normal affect   ASSESSMENT:    1. Coronary artery disease involving native coronary artery of native heart without angina pectoris   2. S/P CABG x 5   3. Mixed hyperlipidemia   4. Essential hypertension   5. OSA (obstructive sleep apnea)   6. Type 2 diabetes mellitus  without complication, without long-term current use of insulin (HCC)    PLAN:    In order of problems listed above:  CAD status post CABG x 5 - March 2022 - Continue dual antiplatelet therapy with aspirin and Plavix - Continue beta-blocker, losartan, and statin - no angina - mobility limited by severe ankle injury three years ago - no bleeding   Hyperlipidemia with LDL goal less than 70 - 10/10/2020: VLDL 57 02/02/2021: Cholesterol, Total 187; HDL 42; LDL Chol Calc (NIH) 94; Triglycerides 304 - She is only on 10 mg of crestor - she has a history of side effects with statins, doing OK on 10 mg crestor up from 5 mg - we discussed possible PCSK9i if we can't get her to goal   Hypertension - BP was not at goal at last visit and losartan was started at 50 mg - HCTZ was discontinued due to elevated triglycerides - will increase losartan to 75 mg for 2 weeks --> if still higher than 025 systolic, she will increased to 100 mg daily  - she will have planned labs the week of Oct 17   OSA on BIPAP - following with Dr. Claiborne Billings - doing well, feels much better with BiPAP - 100% compliance - will increase humidification - sleeping well, less daytime fatigue   DM2 - A1c 6.1% - continue metformin - consider adding SGLT2i, defer to PCP   Follow up with me or Dr. Martinique as planned.   Medication Adjustments/Labs and Tests Ordered: Current medicines are reviewed at length with the patient today.  Concerns regarding medicines are outlined above.  No orders of the defined types were placed in this encounter.  No orders of the defined types were placed in this encounter.   Signed, Ledora Bottcher, PA  05/05/2021 3:52 PM    [facility]

## 2021-05-05 ENCOUNTER — Other Ambulatory Visit: Payer: Self-pay

## 2021-05-05 ENCOUNTER — Ambulatory Visit: Payer: Medicare Other | Admitting: Physician Assistant

## 2021-05-05 ENCOUNTER — Encounter: Payer: Self-pay | Admitting: Physician Assistant

## 2021-05-05 VITALS — BP 160/72 | HR 73 | Ht 61.0 in | Wt 209.6 lb

## 2021-05-05 DIAGNOSIS — E119 Type 2 diabetes mellitus without complications: Secondary | ICD-10-CM

## 2021-05-05 DIAGNOSIS — I10 Essential (primary) hypertension: Secondary | ICD-10-CM

## 2021-05-05 DIAGNOSIS — G4733 Obstructive sleep apnea (adult) (pediatric): Secondary | ICD-10-CM

## 2021-05-05 DIAGNOSIS — E782 Mixed hyperlipidemia: Secondary | ICD-10-CM | POA: Diagnosis not present

## 2021-05-05 DIAGNOSIS — I2510 Atherosclerotic heart disease of native coronary artery without angina pectoris: Secondary | ICD-10-CM

## 2021-05-05 DIAGNOSIS — Z951 Presence of aortocoronary bypass graft: Secondary | ICD-10-CM

## 2021-05-05 NOTE — Patient Instructions (Signed)
Medication Instructions:  Increase Losartan 50 mg ( 75 mg 1.5 Tablets Daily for 2 weeks.) ( Then Increase Losartan 100 mg daily for 2 weeks).  *If you need a refill on your cardiac medications before your next appointment, please call your pharmacy*   Lab Work: To Be Done Week of  October 17 th. If you have labs (blood work) drawn today and your tests are completely normal, you will receive your results only by: MyChart Message (if you have MyChart) OR A paper copy in the mail If you have any lab test that is abnormal or we need to change your treatment, we will call you to review the results.   Testing/Procedures: No testing   Follow-Up: At [facility], you and your health needs are our priority.  As part of our continuing mission to provide you with exceptional heart care, we have created designated Provider Care Teams.  These Care Teams include your primary Cardiologist (physician) and Advanced Practice Providers (APPs -  Physician Assistants and Nurse Practitioners) who all work together to provide you with the care you need, when you need it.  We recommend signing up for the patient portal called "MyChart".  Sign up information is provided on this After Visit Summary.  MyChart is used to connect with patients for Virtual Visits (Telemedicine).  Patients are able to view lab/test results, encounter notes, upcoming appointments, etc.  Non-urgent messages can be sent to your provider as well.   To learn more about what you can do with MyChart, go to [email or web address].    Your next appointment:   September 08, 2021 4:00 PM  The format for your next appointment:   In Person  Provider:   Peter Swaziland, MD   Other Instructions If Systolic Blood Pressure over 120. Please call office.

## 2021-05-21 DIAGNOSIS — G4733 Obstructive sleep apnea (adult) (pediatric): Secondary | ICD-10-CM | POA: Diagnosis not present

## 2021-05-29 DIAGNOSIS — G4733 Obstructive sleep apnea (adult) (pediatric): Secondary | ICD-10-CM | POA: Diagnosis not present

## 2021-06-01 ENCOUNTER — Telehealth: Payer: Self-pay | Admitting: Cardiology

## 2021-06-01 NOTE — Telephone Encounter (Signed)
Spoke to patient advised she can have lab work done at [facility].

## 2021-06-01 NOTE — Telephone Encounter (Signed)
New Message:     Please call, pt wants to know if she can her lab work at [facility] in [place]? Also question about the order she already have.

## 2021-06-08 ENCOUNTER — Encounter: Payer: Self-pay | Admitting: Nurse Practitioner

## 2021-06-08 ENCOUNTER — Ambulatory Visit (INDEPENDENT_AMBULATORY_CARE_PROVIDER_SITE_OTHER): Payer: Medicare Other | Admitting: Nurse Practitioner

## 2021-06-08 DIAGNOSIS — J069 Acute upper respiratory infection, unspecified: Secondary | ICD-10-CM

## 2021-06-08 MED ORDER — PREDNISONE 10 MG (21) PO TBPK: ORAL_TABLET | ORAL | 0 refills | Status: DC

## 2021-06-08 NOTE — Assessment & Plan Note (Signed)
Take meds as prescribed - Use a cool mist humidifier  -Use saline nose sprays frequently -Force fluids -Prednisone taper to help relieve congestion -Currently on benzonatate, and Mucinex. -For fever or aches or pains- take Tylenol or ibuprofen. -I at home COVID test positive.  Patient unable to take antiviral at this time.

## 2021-06-08 NOTE — Progress Notes (Signed)
   Virtual Visit  Note Due to COVID-19 pandemic this visit was conducted virtually. This visit type was conducted due to national recommendations for restrictions regarding the COVID-19 Pandemic (e.g. social distancing, sheltering in place) in an effort to limit this patient's exposure and mitigate transmission in our community. All issues noted in this document were discussed and addressed.  A physical exam was not performed with this format.  I connected with Danielle Ellis on 06/08/21 at 9:30 AM by telephone and verified that I am speaking with the correct person using two identifiers. Danielle Ellis is currently located at home during visit. The provider, Daryll Drown, NP is located in their office at time of visit.  I discussed the limitations, risks, security and privacy concerns of performing an evaluation and management service by telephone and the availability of in person appointments. I also discussed with the patient that there may be a patient responsible charge related to this service. The patient expressed understanding and agreed to proceed.   History and Present Illness:  URI  This is a new problem. Episode onset: in the past 3 days. There has been no fever. Associated symptoms include congestion and coughing. Pertinent negatives include no abdominal pain, headaches, nausea, rash, sore throat or swollen glands. She has tried decongestant for the symptoms.     Review of Systems  Constitutional:  Negative for chills, fever and malaise/fatigue.  HENT:  Positive for congestion. Negative for sore throat.   Respiratory:  Positive for cough.   Cardiovascular: Negative.   Gastrointestinal:  Negative for abdominal pain and nausea.  Skin:  Negative for rash.  Neurological:  Negative for headaches.  All other systems reviewed and are negative.   Observations/Objective: Televisit patient not in distress.  Assessment and Plan: Take meds as prescribed - Use a cool mist  humidifier  -Use saline nose sprays frequently -Force fluids -Prednisone taper to help relieve congestion -Currently on benzonatate, and Mucinex. -For fever or aches or pains- take Tylenol or ibuprofen. -I at home COVID test positive.  Patient unable to take antiviral at this time.  Follow up with worsening unresolved symptoms   Follow Up Instructions: Follow-up with unresolved symptoms    I discussed the assessment and treatment plan with the patient. The patient was provided an opportunity to ask questions and all were answered. The patient agreed with the plan and demonstrated an understanding of the instructions.   The patient was advised to call back or seek an in-person evaluation if the symptoms worsen or if the condition fails to improve as anticipated.  The above assessment and management plan was discussed with the patient. The patient verbalized understanding of and has agreed to the management plan. Patient is aware to call the clinic if symptoms persist or worsen. Patient is aware when to return to the clinic for a follow-up visit. Patient educated on when it is appropriate to go to the emergency department.   Time call ended: 9:39 AM  I provided 9 minutes of  non face-to-face time during this encounter.    Daryll Drown, NP

## 2021-06-16 MED ORDER — LOSARTAN POTASSIUM 100 MG PO TABS
100.0000 mg | ORAL_TABLET | Freq: Every day | ORAL | 3 refills | Status: DC
  Filled 2021-09-18: qty 90, 90d supply, fill #0
  Filled 2021-12-19: qty 90, 90d supply, fill #1
  Filled 2022-03-15: qty 90, 90d supply, fill #2

## 2021-06-18 ENCOUNTER — Telehealth: Payer: Self-pay | Admitting: Physician Assistant

## 2021-06-18 NOTE — Telephone Encounter (Signed)
  Pt c/o medication issue:  1. Name of Medication: losartan (COZAAR) 100 MG tablet  2. How are you currently taking this medication (dosage and times per day)? Take 1 tablet (100 mg total) by mouth daily.  3. Are you having a reaction (difficulty breathing--STAT)?   4. What is your medication issue? Pt said [facility] her and was told they need prior auth for this meds

## 2021-06-19 NOTE — Telephone Encounter (Signed)
Called [facility], spoke with [facility] pharmacy staff. She states they received the needed information yesterday. The prescription is being processed for the pt. Ref #: [id number]

## 2021-06-23 ENCOUNTER — Other Ambulatory Visit: Payer: Self-pay

## 2021-06-23 ENCOUNTER — Other Ambulatory Visit: Payer: Medicare Other

## 2021-06-23 DIAGNOSIS — E119 Type 2 diabetes mellitus without complications: Secondary | ICD-10-CM | POA: Diagnosis not present

## 2021-06-23 DIAGNOSIS — I2510 Atherosclerotic heart disease of native coronary artery without angina pectoris: Secondary | ICD-10-CM | POA: Diagnosis not present

## 2021-06-23 DIAGNOSIS — E782 Mixed hyperlipidemia: Secondary | ICD-10-CM | POA: Diagnosis not present

## 2021-06-23 DIAGNOSIS — I10 Essential (primary) hypertension: Secondary | ICD-10-CM | POA: Diagnosis not present

## 2021-06-23 DIAGNOSIS — G4733 Obstructive sleep apnea (adult) (pediatric): Secondary | ICD-10-CM | POA: Diagnosis not present

## 2021-06-24 LAB — BASIC METABOLIC PANEL
BUN/Creatinine Ratio: 18 (ref 12–28)
BUN: 16 mg/dL (ref 8–27)
CO2: 21 mmol/L (ref 20–29)
Calcium: 9.5 mg/dL (ref 8.7–10.3)
Chloride: 105 mmol/L (ref 96–106)
Creatinine, Ser: 0.87 mg/dL (ref 0.57–1.00)
Glucose: 97 mg/dL (ref 70–99)
Potassium: 4.4 mmol/L (ref 3.5–5.2)
Sodium: 145 mmol/L — ABNORMAL HIGH (ref 134–144)
eGFR: 73 mL/min/{1.73_m2} (ref >59)

## 2021-06-24 LAB — HEPATIC FUNCTION PANEL
ALT: 43 IU/L — ABNORMAL HIGH (ref 0–32)
AST: 25 IU/L (ref 0–40)
Albumin: 4.6 g/dL (ref 3.8–4.8)
Alkaline Phosphatase: 161 IU/L — ABNORMAL HIGH (ref 44–121)
Bilirubin Total: 0.4 mg/dL (ref 0.0–1.2)
Bilirubin, Direct: 0.11 mg/dL (ref 0.00–0.40)
Total Protein: 7.1 g/dL (ref 6.0–8.5)

## 2021-06-24 LAB — LIPID PANEL
Chol/HDL Ratio: 4.3 ratio (ref 0.0–4.4)
Cholesterol, Total: 179 mg/dL (ref 100–199)
HDL: 42 mg/dL (ref >39)
LDL Chol Calc (NIH): 92 mg/dL (ref 0–99)
Triglycerides: 271 mg/dL — ABNORMAL HIGH (ref 0–149)
VLDL Cholesterol Cal: 45 mg/dL — ABNORMAL HIGH (ref 5–40)

## 2021-06-24 LAB — HEMOGLOBIN A1C
Est. average glucose Bld gHb Est-mCnc: 131 mg/dL
Hgb A1c MFr Bld: 6.2 % — ABNORMAL HIGH (ref 4.8–5.6)

## 2021-06-29 DIAGNOSIS — G4733 Obstructive sleep apnea (adult) (pediatric): Secondary | ICD-10-CM | POA: Diagnosis not present

## 2021-06-30 DIAGNOSIS — E782 Mixed hyperlipidemia: Secondary | ICD-10-CM

## 2021-07-06 ENCOUNTER — Telehealth: Payer: Self-pay | Admitting: Pharmacist

## 2021-07-06 ENCOUNTER — Other Ambulatory Visit: Payer: Self-pay

## 2021-07-06 ENCOUNTER — Ambulatory Visit (INDEPENDENT_AMBULATORY_CARE_PROVIDER_SITE_OTHER): Payer: Medicare Other | Admitting: Pharmacist

## 2021-07-06 VITALS — BP 147/82 | HR 67 | Resp 14 | Wt 208.2 lb

## 2021-07-06 DIAGNOSIS — I2510 Atherosclerotic heart disease of native coronary artery without angina pectoris: Secondary | ICD-10-CM

## 2021-07-06 DIAGNOSIS — E782 Mixed hyperlipidemia: Secondary | ICD-10-CM

## 2021-07-06 DIAGNOSIS — I214 Non-ST elevation (NSTEMI) myocardial infarction: Secondary | ICD-10-CM

## 2021-07-06 DIAGNOSIS — E785 Hyperlipidemia, unspecified: Secondary | ICD-10-CM

## 2021-07-06 DIAGNOSIS — G4733 Obstructive sleep apnea (adult) (pediatric): Secondary | ICD-10-CM | POA: Diagnosis not present

## 2021-07-06 NOTE — Progress Notes (Signed)
Patient ID: Danielle Ellis                 DOB: Aug 29, 1955                    MRN: [id number]     HPI: Danielle Ellis is a 65 y.o. female patient referred to lipid clinic by Dr Martinique and Doreene Adas. PMH is significant for HTN, NSTEMI, T2DM, HLD, and CABG x 5.  Patient presents today anxious over therapy options.  Currently on rosuvastatin 52m could not tolerate higher doses due to muscle pain.  Since her admission in February 2022 has made diet changes.  Has discontinued soda and now has reduced carbohydrates and typically eats salads, grilled chicken, and oatmeal.  Is on her feet daily but daughter is trying to make her due more strenuous exercises.  Daughter is a cOrthoptist  Has a strong family history of CAD and DM on her mother's side.  Does not know father's medical history.  Does not drink alcohol or use tobacco.  Current Medications: rosuvastatin 134mdaily Intolerances: Higher dose rosuvastatin Risk Factors: CAD, DM,hx of NSTEMI, Hx of CABG LDL goal: <55  Labs:TC 179, Trigs 271, HDL 42, LDL 92 (06/23/21 on rosuvastatin 10)  Past Medical History:  Diagnosis Date   Anxiety    Hypertension    Sleep apnea     Current Outpatient Medications on File Prior to Visit  Medication Sig Dispense Refill   acetaminophen (TYLENOL) 325 MG tablet Take 650 mg by mouth every 6 (six) hours as needed.     aspirin EC 81 MG tablet Take 1 tablet (81 mg total) by mouth daily. Swallow whole. 90 tablet 3   blood glucose meter kit and supplies Dispense based on patient and insurance preference. Use up to four times daily as directed. (FOR ICD-10 E10.9, E11.9). 1 each 0   calcium gluconate 500 MG tablet Take 500 mg by mouth daily.     clopidogrel (PLAVIX) 75 MG tablet Take 1 tablet (75 mg total) by mouth daily. 90 tablet 2   escitalopram (LEXAPRO) 10 MG tablet Take 1 tablet (10 mg total) by mouth daily. 90 tablet 2   fluticasone (FLONASE) 50 MCG/ACT nasal spray Place 1 spray into both nostrils  daily. 48 mL 3   glucose blood (ONETOUCH ULTRA) test strip Test BS 4 times daily Dx E11.9 400 each 3   latanoprost (XALATAN) 0.005 % ophthalmic solution Place 1 drop into both eyes at bedtime.     loratadine (CLARITIN) 10 MG tablet Take 10 mg by mouth daily.     losartan (COZAAR) 100 MG tablet Take 1 tablet (100 mg total) by mouth daily. 90 tablet 3   metFORMIN (GLUCOPHAGE) 500 MG tablet TAKE 1 TABLET BY MOUTH  DAILY WITH BREAKFAST 90 tablet 0   metoprolol succinate (TOPROL-XL) 50 MG 24 hr tablet Take 1 tablet (50 mg total) by mouth daily. Take with or immediately following a meal. 90 tablet 3   Multiple Vitamin (MULTIVITAMIN) capsule Take 1 capsule by mouth daily.     predniSONE (STERAPRED UNI-PAK 21 TAB) 10 MG (21) TBPK tablet 6 tablet day 1, 5 tab day 2, 4 tablet day 3, 3 tablet day 4, 2 tablet day 5, 1 tablet day 6 1 each 0   rosuvastatin (CRESTOR) 10 MG tablet Take 1 tablet (10 mg total) by mouth daily. 90 tablet 3   valACYclovir (VALTREX) 1000 MG tablet TAKE 2 TABLETS BY MOUTH  TWICE  DAILY 12 tablet 1   No current facility-administered medications on file prior to visit.    No Known Allergies  Assessment/Plan:  1. Hyperlipidemia - Patient's most recent LDL 92 on maximally tolerated statin which is above goal of <55. Aggressive goal selected due to history of NSTEMI, CABG, and DM.  Patient has already made positive heart healthy diet changes and is working on increasing physical activity.  Since patient can not tolerate increased dosages of rosuvastatin, recommend addition of Repatha.  Using demo pen, educated patient on mechanism of action, storage, site selection, administration, and possible adverse effects.  Patient was able to demonstrate in room.  Is nervous about cost and is switching to [employer] on 08/16/21.  Will complete PA with plans to complete again in January.  May require grant assistance.  Recheck lipid panel in 2-3 months.  Continue rosuvastatin 17m once  daily Start Repatha 1453msq q 14 days Recheck lipid panel in 2-3 months  ChKarren CobblePharmD, BCSt. PetersburgCDSouth DaytonaCPPinaloFreemanSuPike Creek ValleyrWest [id number]: [phone]: [phone]

## 2021-07-06 NOTE — Patient Instructions (Signed)
It was nice meeting you today  We would like your LDL (bad cholesterol) to be less than 55  Continue your rosuvastatin 10mg  once daily  We would like to start you on a new medication called Repatha which you will inject once every 2 weeks  If you would like to start it, we will complete the prior authorization for you and contact you when it is approved  Once you start it, we will recheck your cholesterol levels in about 2-3 months  Continue your healthy eating and try to increase your physical activity  Please call with any questions  , PharmD, BCACP, CDCES, CPP [address] [place], [place], [place] Phone: [phone], Fax: [phone]

## 2021-07-06 NOTE — Telephone Encounter (Signed)
Please complete prior authorization for:  Name of medication, dose, and frequency Repatha 140mg  sq q 14 days  Lab Orders Requested? yes  Which labs? Lipid panel  Estimated date for labs to be scheduled 2-3 months  Does patient need activated copay card? No, but may need assistance depending on copay

## 2021-07-07 MED ORDER — REPATHA SURECLICK 140 MG/ML ~~LOC~~ SOAJ: 140.0000 mg | SUBCUTANEOUS | 11 refills | Status: DC

## 2021-07-07 NOTE — Telephone Encounter (Signed)
Pa submitted [employer] (Key: [id number]) - [id number] Repatha SureClick 140MG /ML auto-injectors Lipid panel ordered and released

## 2021-07-07 NOTE — Telephone Encounter (Signed)
Called and spoke w/pt and stated that they were approved for repatha 140mg  q14d, rx sent, pt informed to called back if unaffordable and to complete fasting lab work. They seemed concerned with sugar levels so we ordered a1c and lipid panel to be completed post 4th dose. Pt voiced understanding.

## 2021-07-07 NOTE — Addendum Note (Signed)
Addended by: Eather Colas on: 07/07/2021 10:39 AM   Modules accepted: Orders

## 2021-07-07 NOTE — Addendum Note (Signed)
Addended by: Eather Colas on: 07/07/2021 01:17 PM   Modules accepted: Orders

## 2021-07-12 ENCOUNTER — Other Ambulatory Visit: Payer: Self-pay | Admitting: Family

## 2021-07-12 ENCOUNTER — Other Ambulatory Visit: Payer: Self-pay | Admitting: Cardiology

## 2021-07-12 DIAGNOSIS — F411 Generalized anxiety disorder: Secondary | ICD-10-CM

## 2021-07-12 DIAGNOSIS — F320 Major depressive disorder, single episode, mild: Secondary | ICD-10-CM

## 2021-07-13 ENCOUNTER — Other Ambulatory Visit: Payer: Self-pay | Admitting: Pharmacist

## 2021-07-13 MED ORDER — REPATHA SURECLICK 140 MG/ML ~~LOC~~ SOAJ: 1.0000 "pen " | SUBCUTANEOUS | 11 refills | Status: DC

## 2021-07-22 ENCOUNTER — Other Ambulatory Visit: Payer: Self-pay | Admitting: Family

## 2021-07-22 DIAGNOSIS — R7303 Prediabetes: Secondary | ICD-10-CM

## 2021-07-29 DIAGNOSIS — G4733 Obstructive sleep apnea (adult) (pediatric): Secondary | ICD-10-CM | POA: Diagnosis not present

## 2021-08-04 ENCOUNTER — Ambulatory Visit (INDEPENDENT_AMBULATORY_CARE_PROVIDER_SITE_OTHER): Payer: Medicare Other | Admitting: Family

## 2021-08-04 ENCOUNTER — Encounter: Payer: Self-pay | Admitting: Family

## 2021-08-04 VITALS — BP 138/80

## 2021-08-04 DIAGNOSIS — F411 Generalized anxiety disorder: Secondary | ICD-10-CM | POA: Diagnosis not present

## 2021-08-04 DIAGNOSIS — E119 Type 2 diabetes mellitus without complications: Secondary | ICD-10-CM

## 2021-08-04 DIAGNOSIS — I252 Old myocardial infarction: Secondary | ICD-10-CM

## 2021-08-04 DIAGNOSIS — Z951 Presence of aortocoronary bypass graft: Secondary | ICD-10-CM | POA: Diagnosis not present

## 2021-08-04 DIAGNOSIS — F320 Major depressive disorder, single episode, mild: Secondary | ICD-10-CM

## 2021-08-04 DIAGNOSIS — I10 Essential (primary) hypertension: Secondary | ICD-10-CM | POA: Diagnosis not present

## 2021-08-04 DIAGNOSIS — E782 Mixed hyperlipidemia: Secondary | ICD-10-CM | POA: Diagnosis not present

## 2021-08-04 DIAGNOSIS — I2510 Atherosclerotic heart disease of native coronary artery without angina pectoris: Secondary | ICD-10-CM | POA: Diagnosis not present

## 2021-08-04 NOTE — Patient Instructions (Signed)
Health Maintenance After Age 67 After age 67, you are at a higher risk for certain long-term diseases and infections as well as injuries from falls. Falls are a major cause of broken bones and head injuries in people who are older than age 67. Getting regular preventive care can help to keep you healthy and well. Preventive care includes getting regular testing and making lifestyle changes as recommended by your health care provider. Talk with your health care provider about: Which screenings and tests you should have. A screening is a test that checks for a disease when you have no symptoms. A diet and exercise plan that is right for you. What should I know about screenings and tests to prevent falls? Screening and testing are the best ways to find a health problem early. Early diagnosis and treatment give you the best chance of managing medical conditions that are common after age 67. Certain conditions and lifestyle choices may make you more likely to have a fall. Your health care provider may recommend: Regular vision checks. Poor vision and conditions such as cataracts can make you more likely to have a fall. If you wear glasses, make sure to get your prescription updated if your vision changes. Medicine review. Work with your health care provider to regularly review all of the medicines you are taking, including over-the-counter medicines. Ask your health care provider about any side effects that may make you more likely to have a fall. Tell your health care provider if any medicines that you take make you feel dizzy or sleepy. Strength and balance checks. Your health care provider may recommend certain tests to check your strength and balance while standing, walking, or changing positions. Foot health exam. Foot pain and numbness, as well as not wearing proper footwear, can make you more likely to have a fall. Screenings, including: Osteoporosis screening. Osteoporosis is a condition that causes  the bones to get weaker and break more easily. Blood pressure screening. Blood pressure changes and medicines to control blood pressure can make you feel dizzy. Depression screening. You may be more likely to have a fall if you have a fear of falling, feel depressed, or feel unable to do activities that you used to do. Alcohol use screening. Using too much alcohol can affect your balance and may make you more likely to have a fall. Follow these instructions at home: Lifestyle Do not drink alcohol if: Your health care provider tells you not to drink. If you drink alcohol: Limit how much you have to: 0-1 drink a day for women. 0-2 drinks a day for men. Know how much alcohol is in your drink. In the [place] one drink equals one 12 oz bottle of beer (355 mL), one 5 oz glass of wine (148 mL), or one 1 oz glass of hard liquor (44 mL). Do not use any products that contain nicotine or tobacco. These products include cigarettes, chewing tobacco, and vaping devices, such as e-cigarettes. If you need help quitting, ask your health care provider. Activity  Follow a regular exercise program to stay fit. This will help you maintain your balance. Ask your health care provider what types of exercise are appropriate for you. If you need a cane or walker, use it as recommended by your health care provider. Wear supportive shoes that have nonskid soles. Safety  Remove any tripping hazards, such as rugs, cords, and clutter. Install safety equipment such as grab bars in bathrooms and safety rails on stairs. Keep rooms and walkways   well-lit. General instructions Talk with your health care provider about your risks for falling. Tell your health care provider if: You fall. Be sure to tell your health care provider about all falls, even ones that seem minor. You feel dizzy, tiredness (fatigue), or off-balance. Take over-the-counter and prescription medicines only as told by your health care provider. These include  supplements. Eat a healthy diet and maintain a healthy weight. A healthy diet includes low-fat dairy products, low-fat (lean) meats, and fiber from whole grains, beans, and lots of fruits and vegetables. Stay current with your vaccines. Schedule regular health, dental, and eye exams. Summary Having a healthy lifestyle and getting preventive care can help to protect your health and wellness after age 67. Screening and testing are the best way to find a health problem early and help you avoid having a fall. Early diagnosis and treatment give you the best chance for managing medical conditions that are more common for people who are older than age 67. Falls are a major cause of broken bones and head injuries in people who are older than age 67. Take precautions to prevent a fall at home. Work with your health care provider to learn what changes you can make to improve your health and wellness and to prevent falls. This information is not intended to replace advice given to you by your health care provider. Make sure you discuss any questions you have with your health care provider. Document Revised: 12/22/2020 Document Reviewed: 12/22/2020 Elsevier Patient Education  2022 Elsevier Inc.  

## 2021-08-04 NOTE — Progress Notes (Signed)
Virtual Visit  Note Due to COVID-19 pandemic this visit was conducted virtually. This visit type was conducted due to national recommendations for restrictions regarding the COVID-19 Pandemic (e.g. social distancing, sheltering in place) in an effort to limit this patient's exposure and mitigate transmission in our community. All issues noted in this document were discussed and addressed.  A physical exam was not performed with this format.  I connected with Danielle Ellis on 08/04/21 at 9:00 AM  by telephone and verified that I am speaking with the correct person using two identifiers. Danielle Ellis is currently located at home and no one is currently with her during visit. The provider, Jannifer Rodney, FNP is located in their office at time of visit.  I discussed the limitations, risks, security and privacy concerns of performing an evaluation and management service by telephone and the availability of in person appointments. I also discussed with the patient that there may be a patient responsible charge related to this service. The patient expressed understanding and agreed to proceed.  [name], grimes are scheduled for a virtual visit with your provider today.    Just as we do with appointments in the office, we must obtain your consent to participate.  Your consent will be active for this visit and any virtual visit you may have with one of our providers in the next 365 days.    If you have a MyChart account, I can also send a copy of this consent to you electronically.  All virtual visits are billed to your insurance company just like a traditional visit in the office.  As this is a virtual visit, video technology does not allow for your provider to perform a traditional examination.  This may limit your provider's ability to fully assess your condition.  If your provider identifies any concerns that need to be evaluated in person or the need to arrange testing such as labs, EKG, etc, we  will make arrangements to do so.    Although advances in technology are sophisticated, we cannot ensure that it will always work on either your end or our end.  If the connection with a video visit is poor, we may have to switch to a telephone visit.  With either a video or telephone visit, we are not always able to ensure that we have a secure connection.   I need to obtain your verbal consent now.   Are you willing to proceed with your visit today?   Danielle Ellis has provided verbal consent on 08/04/2021 for a virtual visit (video or telephone).   Jannifer Rodney, [place] 08/04/2021  9:04 AM    History and Present Illness:  Pt present to the office today chronic follow up. She is followed by Cardiologists for NSTEMI on 10/09/20.  Hypertension This is a chronic problem. The current episode started more than 1 year ago. The problem has been resolved since onset. The problem is controlled. Associated symptoms include anxiety. Pertinent negatives include no blurred vision, malaise/fatigue, peripheral edema or shortness of breath. Risk factors for coronary artery disease include dyslipidemia, diabetes mellitus, obesity and sedentary lifestyle. The current treatment provides moderate improvement.  Diabetes She presents for her follow-up diabetic visit. She has type 2 diabetes mellitus. Hypoglycemia symptoms include nervousness/anxiousness. Pertinent negatives for diabetes include no blurred vision and no foot paresthesias. Symptoms are stable. Diabetic complications include heart disease. Pertinent negatives for diabetic complications include no nephropathy or peripheral neuropathy. Risk factors for coronary artery disease  include dyslipidemia, diabetes mellitus, hypertension, sedentary lifestyle and post-menopausal. Her dinner blood glucose range is generally 90-110 mg/dl. Eye exam is current.  Hyperlipidemia This is a chronic problem. The current episode started more than 1 year ago. Exacerbating  diseases include obesity. Pertinent negatives include no shortness of breath. Current antihyperlipidemic treatment includes statins. The current treatment provides moderate improvement of lipids. Risk factors for coronary artery disease include dyslipidemia, diabetes mellitus, hypertension, a sedentary lifestyle and post-menopausal.  Anxiety Presents for follow-up visit. Symptoms include depressed mood, excessive worry, irritability and nervous/anxious behavior. Patient reports no shortness of breath. Symptoms occur most days. The severity of symptoms is moderate.    Depression        This is a chronic problem.  The current episode started more than 1 year ago.   The onset quality is gradual.   The problem occurs intermittently.  Associated symptoms include sad.  Associated symptoms include no helplessness, no hopelessness and not irritable.  Past treatments include SSRIs - Selective serotonin reuptake inhibitors.  Compliance with treatment is good.  Past medical history includes anxiety.      Review of Systems  Constitutional:  Positive for irritability. Negative for malaise/fatigue.  Eyes:  Negative for blurred vision.  Respiratory:  Negative for shortness of breath.   Psychiatric/Behavioral:  Positive for depression. The patient is nervous/anxious.   All other systems reviewed and are negative.   Observations/Objective: No SOB or distress noted   Assessment and Plan: 1. Hypertension, unspecified type  2. Coronary artery disease involving native coronary artery of native heart without angina pectoris  3. GAD (generalized anxiety disorder)  4. Depression, major, single episode, mild (HCC)  5. S/P CABG x 5  6. Mixed hyperlipidemia  7. Type 2 diabetes mellitus without complication, without long-term current use of insulin (HCC)  8. History of non-ST elevation myocardial infarction (NSTEMI)    Labs reviewed from Cardiologists  Continue medications Low carb Encourage healthy  diet and exercise   I discussed the assessment and treatment plan with the patient. The patient was provided an opportunity to ask questions and all were answered. The patient agreed with the plan and demonstrated an understanding of the instructions.   The patient was advised to call back or seek an in-person evaluation if the symptoms worsen or if the condition fails to improve as anticipated.  The above assessment and management plan was discussed with the patient. The patient verbalized understanding of and has agreed to the management plan. Patient is aware to call the clinic if symptoms persist or worsen. Patient is aware when to return to the clinic for a follow-up visit. Patient educated on when it is appropriate to go to the emergency department.   Time call ended:  9:22 AM   I provided 22 minutes of  non face-to-face time during this encounter.    Jannifer Rodney, FNP

## 2021-08-18 ENCOUNTER — Other Ambulatory Visit: Payer: Self-pay | Admitting: Family

## 2021-08-21 ENCOUNTER — Other Ambulatory Visit (HOSPITAL_COMMUNITY): Payer: Self-pay

## 2021-08-21 MED ORDER — LATANOPROST 0.005 % OP SOLN
OPHTHALMIC | 2 refills | Status: DC
  Filled 2022-01-14: qty 7.5, 75d supply, fill #0

## 2021-08-21 MED ORDER — BACLOFEN 10 MG PO TABS: ORAL_TABLET | ORAL | 3 refills | Status: DC

## 2021-08-25 DIAGNOSIS — I10 Essential (primary) hypertension: Secondary | ICD-10-CM | POA: Diagnosis not present

## 2021-08-25 DIAGNOSIS — F411 Generalized anxiety disorder: Secondary | ICD-10-CM | POA: Diagnosis not present

## 2021-08-25 DIAGNOSIS — F324 Major depressive disorder, single episode, in partial remission: Secondary | ICD-10-CM | POA: Diagnosis not present

## 2021-08-25 DIAGNOSIS — G4733 Obstructive sleep apnea (adult) (pediatric): Secondary | ICD-10-CM | POA: Diagnosis not present

## 2021-08-25 DIAGNOSIS — G8929 Other chronic pain: Secondary | ICD-10-CM | POA: Diagnosis not present

## 2021-08-25 DIAGNOSIS — H401131 Primary open-angle glaucoma, bilateral, mild stage: Secondary | ICD-10-CM | POA: Diagnosis not present

## 2021-08-25 DIAGNOSIS — E785 Hyperlipidemia, unspecified: Secondary | ICD-10-CM | POA: Diagnosis not present

## 2021-08-25 DIAGNOSIS — I2510 Atherosclerotic heart disease of native coronary artery without angina pectoris: Secondary | ICD-10-CM | POA: Diagnosis not present

## 2021-08-25 DIAGNOSIS — E1139 Type 2 diabetes mellitus with other diabetic ophthalmic complication: Secondary | ICD-10-CM | POA: Diagnosis not present

## 2021-08-25 DIAGNOSIS — E1169 Type 2 diabetes mellitus with other specified complication: Secondary | ICD-10-CM | POA: Diagnosis not present

## 2021-08-25 DIAGNOSIS — H42 Glaucoma in diseases classified elsewhere: Secondary | ICD-10-CM | POA: Diagnosis not present

## 2021-08-25 NOTE — Progress Notes (Signed)
Cardiology Office Note   Date:  09/08/2021   ID:  Danielle Ellis, DOB 02-16-1956, MRN [id number]  PCP:  [name], FNP  Cardiologist:   Rollyn Scialdone Martinique, MD   Chief Complaint  Patient presents with   Coronary Artery Disease       History of Present Illness: Danielle Ellis is a 66 y.o. female who presents for follow up CAD. She has a a PMH of hypertension, coronary artery disease status post NSTEMI 10/09/2020, type 2 diabetes, hyperlipidemia, generalized anxiety disorder, CABG x5 on 10/14/2020 (LIMA-LAD, SVG-diagonal, SVG-OM1/OM2, and SVG-PDA)   She was admitted to the hospital on 10/10/2020.  She reported several week history of recurrent episodes of substernal chest pain that would radiate to her left shoulder and left hand.  She also noted some associated shortness of breath diaphoresis and nausea.  She was noted to have mildly elevated troponins and her EKG showed diffuse ST depressions  with new T wave inversions inferiorly.  Echocardiogram showed normal LVEF with no significant valvular abnormalities.  She underwent cardiac catheterization which showed severe multivessel disease.  She underwent CABG x5 on 10/14/2020.   She maintained sinus rhythm.  Due to her NSTEMI she was placed on Plavix and her aspirin was decreased to 81 mg daily.   She was discharged in stable condition on 10/19/2020. When seen in March it was noted she had discontinued CPAP therapy several years before and sleep study was ordered. This was performed and BIPAP was recommended with follow up in sleep clinic.   She was seen back in September and was on BIPAP with significant improvement in symptoms. Was referred to lipid clinic since not at goal on maximally tolerated statin. PCSK 9 inhibitor was started. Tolerating this well. Reports BP at home typically 135/84.   She states she feels great. No chest pain or dyspnea. No palpitations, dizziness or edema. Tolerating medication well.     Past Medical History:   Diagnosis Date   Anxiety    Hypertension    Sleep apnea     Past Surgical History:  Procedure Laterality Date   APPENDECTOMY     CORONARY ARTERY BYPASS GRAFT N/A 10/14/2020   Procedure: CORONARY ARTERY BYPASS GRAFTING (CABG) X FIVE, USING LEFT INTERNAL MAMMARY ARTERY AND BILATERAL LEGS GREATER SAPHENOUS VEINS HARVESTED ENDOSCOPICALLY;  Surgeon: Gaye Pollack, MD;  Location: [facility];  Service: Open Heart Surgery;  Laterality: N/A;   L ankle surgery     LEFT HEART CATH AND CORONARY ANGIOGRAPHY N/A 10/10/2020   Procedure: LEFT HEART CATH AND CORONARY ANGIOGRAPHY;  Surgeon: Martinique, Yanil Dawe M, MD;  Location: [facility];  Service: Cardiovascular;  Laterality: N/A;   TEE WITHOUT CARDIOVERSION N/A 10/14/2020   Procedure: TRANSESOPHAGEAL ECHOCARDIOGRAM (TEE);  Surgeon: Gaye Pollack, MD;  Location: [facility];  Service: Open Heart Surgery;  Laterality: N/A;     Current Outpatient Medications  Medication Sig Dispense Refill   acetaminophen (TYLENOL) 325 MG tablet Take 650 mg by mouth every 6 (six) hours as needed.     aspirin EC 81 MG tablet Take 1 tablet (81 mg total) by mouth daily. Swallow whole. 90 tablet 3   baclofen (LIORESAL) 10 MG tablet Take 1 tablet by mouth 3 times a day 90 tablet 3   blood glucose meter kit and supplies Dispense based on patient and insurance preference. Use up to four times daily as directed. (FOR ICD-10 E10.9, E11.9). 1 each 0   calcium gluconate 500 MG  tablet Take 500 mg by mouth daily.     clopidogrel (PLAVIX) 75 MG tablet TAKE 1 TABLET BY MOUTH  DAILY 90 tablet 3   escitalopram (LEXAPRO) 10 MG tablet TAKE 1 TABLET BY MOUTH  DAILY 90 tablet 0   Evolocumab (REPATHA SURECLICK) 035 MG/ML SOAJ Inject 1 pen into the skin every 14 (fourteen) days. 2 mL 11   fluticasone (FLONASE) 50 MCG/ACT nasal spray Place 1 spray into both nostrils daily. 48 g 3   glucose blood (ONETOUCH ULTRA) test strip TEST BLOOD SUGAR 4 TIMES  DAILY 400 strip 3   Lancets (ONETOUCH ULTRASOFT)  lancets TEST BLOOD SUGAR 4 TIMES  DAILY 400 each 3   latanoprost (XALATAN) 0.005 % ophthalmic solution Place 1 drop into both eyes at bedtime.     latanoprost (XALATAN) 0.005 % ophthalmic solution Instill 1 drop into both eyes daily 7.5 mL 2   loratadine (CLARITIN) 10 MG tablet Take 10 mg by mouth daily.     losartan (COZAAR) 100 MG tablet Take 1 tablet  by mouth daily. 90 tablet 3   metFORMIN (GLUCOPHAGE) 500 MG tablet TAKE 1 TABLET BY MOUTH  DAILY WITH BREAKFAST 90 tablet 0   metoprolol succinate (TOPROL-XL) 50 MG 24 hr tablet Take 1 tablet  by mouth daily. Take with or immediately following a meal. 90 tablet 3   Multiple Vitamin (MULTIVITAMIN) capsule Take 1 capsule by mouth daily.     rosuvastatin (CRESTOR) 10 MG tablet Take 1 tablet by mouth daily. 90 tablet 3   valACYclovir (VALTREX) 1000 MG tablet TAKE 2 TABLETS BY MOUTH  TWICE DAILY 12 tablet 1   No current facility-administered medications for this visit.    Allergies:   Patient has no known allergies.    Social History:  The patient  reports that she has never smoked. She has never used smokeless tobacco. She reports that she does not drink alcohol and does not use drugs.   Family History:  The patient's family history includes Cancer in her mother; Diabetes in her mother; Heart attack in her mother; Kidney disease in her mother.    ROS:  Please see the history of present illness.   Otherwise, review of systems are positive for none.   All other systems are reviewed and negative.    PHYSICAL EXAM: VS:  BP (!) 166/80 (BP Location: Right Arm, Cuff Size: Small)    Pulse 76    Ht 5' 1.5" (1.562 m)    Wt 210 lb 12.8 oz (95.6 kg)    SpO2 99%    BMI 39.19 kg/m  , BMI Body mass index is 39.19 kg/m. GEN: Well nourished, obese, in no acute distress  HEENT: normal  Neck: no JVD, carotid bruits, or masses Cardiac: RRR; no murmurs, rubs, or gallops,no edema. Sternal incision has healed well.  Respiratory:  clear to auscultation  bilaterally, normal work of breathing GI: soft, nontender, nondistended, + BS MS: no deformity or atrophy  Skin: warm and dry, no rash Neuro:  Strength and sensation are intact Psych: euthymic mood, full affect   EKG:  EKG isordered today. The ekg ordered today demonstrates NSR low voltage, old septal infarct. I have personally reviewed and interpreted this study.    Recent Labs: 10/09/2020: TSH 4.842 10/15/2020: Magnesium 2.3 02/02/2021: Hemoglobin 13.7; Platelets 210 06/23/2021: ALT 43; BUN 16; Creatinine, Ser 0.87; Potassium 4.4; Sodium 145    Lipid Panel    Component Value Date/Time   CHOL 179 06/23/2021 0917   TRIG  271 (H) 06/23/2021 0917   HDL 42 06/23/2021 0917   CHOLHDL 4.3 06/23/2021 0917   CHOLHDL 8.2 10/10/2020 0144   VLDL 57 (H) 10/10/2020 0144   LDLCALC 92 06/23/2021 0917      Wt Readings from Last 3 Encounters:  09/08/21 210 lb 12.8 oz (95.6 kg)  07/06/21 208 lb 3.2 oz (94.4 kg)  05/05/21 209 lb 9.6 oz (95.1 kg)      Other studies Reviewed: Additional studies/ records that were reviewed today include: sleep study as noted in chart.    ASSESSMENT AND PLAN:  1.  Coronary artery disease-presented with NSTEMI. Found to have multivessel CAD. Status post CABG x5 October 14, 2020 May DC Plavix after current Rx runs out Continue ASA, Toprol, statin.  Heart healthy low Increase physical activity slowly Encourage weight loss.    2. Essential hypertension-BP is elevated today Heart healthy low-sodium diet Increase physical activity  On losartan and Toprol XL.  Will keep a diary at home If staying over 130/80 will need to consider additional therapy - likely amlodipine.    3. Hyperlipidemia- intolerant to lipitor and pravastatin in the past. Tolerating Crestor 5 mg daily. Still not at goal LDL < 70. Now on Repatha.  If triglycerides remain high would consider adding Vascepa. Heart healthy low-sodium high-fiber diet Scheduled for follow up lab.   4.  OSA/snoring/daytime somnolence -abnormal sleep study. Now on BIPAP. Follow up with Dr Claiborne Billings.   5. Type 2 diabetes-A1c 6.2%.  Continue Metformin Heart healthy low-sodium carb modified diet Per primary care.      Current medicines are reviewed at length with the patient today.  The patient does not have concerns regarding medicines.  The following changes have been made:  see above  Labs/ tests ordered today include:  No orders of the defined types were placed in this encounter.     Disposition:   FU with me  in 6 months  Signed, Lino Wickliff Martinique, MD  09/08/2021 4:24 PM    Danielle Ellis [address], [place], [place], [address] Phone [phone], Fax [phone]

## 2021-08-29 DIAGNOSIS — G4733 Obstructive sleep apnea (adult) (pediatric): Secondary | ICD-10-CM | POA: Diagnosis not present

## 2021-09-02 ENCOUNTER — Other Ambulatory Visit (HOSPITAL_COMMUNITY): Payer: Self-pay

## 2021-09-08 ENCOUNTER — Encounter: Payer: Self-pay | Admitting: Cardiology

## 2021-09-08 ENCOUNTER — Other Ambulatory Visit: Payer: Self-pay

## 2021-09-08 ENCOUNTER — Ambulatory Visit (INDEPENDENT_AMBULATORY_CARE_PROVIDER_SITE_OTHER): Payer: HMO | Admitting: Cardiology

## 2021-09-08 VITALS — BP 166/80 | HR 76 | Ht 61.5 in | Wt 210.8 lb

## 2021-09-08 DIAGNOSIS — G4733 Obstructive sleep apnea (adult) (pediatric): Secondary | ICD-10-CM | POA: Diagnosis not present

## 2021-09-08 DIAGNOSIS — I2510 Atherosclerotic heart disease of native coronary artery without angina pectoris: Secondary | ICD-10-CM

## 2021-09-08 DIAGNOSIS — E782 Mixed hyperlipidemia: Secondary | ICD-10-CM

## 2021-09-08 DIAGNOSIS — Z951 Presence of aortocoronary bypass graft: Secondary | ICD-10-CM

## 2021-09-08 DIAGNOSIS — E119 Type 2 diabetes mellitus without complications: Secondary | ICD-10-CM

## 2021-09-08 DIAGNOSIS — I10 Essential (primary) hypertension: Secondary | ICD-10-CM

## 2021-09-08 NOTE — Patient Instructions (Addendum)
You may stop Plavix when this bottle runs out.  Monitor your blood pressure at home - we would like to see it less than 130/80. If it consistently is higher than this let me know.   Try and get your weight down.    Schedule appointment with Dr.Kelly     Follow up with Dr.Jordan in 6 months    Call in April to schedule appointment July appointment

## 2021-09-09 DIAGNOSIS — G4733 Obstructive sleep apnea (adult) (pediatric): Secondary | ICD-10-CM | POA: Diagnosis not present

## 2021-09-18 ENCOUNTER — Other Ambulatory Visit (HOSPITAL_COMMUNITY): Payer: Self-pay

## 2021-09-27 ENCOUNTER — Other Ambulatory Visit: Payer: Self-pay | Admitting: Family

## 2021-09-27 ENCOUNTER — Other Ambulatory Visit: Payer: Self-pay | Admitting: Cardiology

## 2021-09-27 DIAGNOSIS — F320 Major depressive disorder, single episode, mild: Secondary | ICD-10-CM

## 2021-09-27 DIAGNOSIS — F411 Generalized anxiety disorder: Secondary | ICD-10-CM

## 2021-09-29 DIAGNOSIS — G4733 Obstructive sleep apnea (adult) (pediatric): Secondary | ICD-10-CM | POA: Diagnosis not present

## 2021-10-05 ENCOUNTER — Other Ambulatory Visit: Payer: Self-pay | Admitting: Family

## 2021-10-05 DIAGNOSIS — R7303 Prediabetes: Secondary | ICD-10-CM

## 2021-10-16 DIAGNOSIS — G4733 Obstructive sleep apnea (adult) (pediatric): Secondary | ICD-10-CM | POA: Diagnosis not present

## 2021-10-26 ENCOUNTER — Other Ambulatory Visit (HOSPITAL_COMMUNITY): Payer: Self-pay

## 2021-10-27 ENCOUNTER — Other Ambulatory Visit (HOSPITAL_COMMUNITY): Payer: Self-pay

## 2021-10-27 DIAGNOSIS — G4733 Obstructive sleep apnea (adult) (pediatric): Secondary | ICD-10-CM | POA: Diagnosis not present

## 2021-10-30 ENCOUNTER — Telehealth: Payer: Self-pay

## 2021-10-30 NOTE — Telephone Encounter (Signed)
Please call. Thank you. 

## 2021-10-30 NOTE — Telephone Encounter (Signed)
Returned call to pt and she mentioned that she changed insurances and needed to get in touch with the [employer] so I gave her their helpdesk number and started a pa for repatha sureclick 140mg   ? ?Danielle Ellis (KeyTobi Bastos) : [id number] ?Repatha SureClick 140MG /ML auto-injectors ?Status: PA Response - Approved ?Created: March 17th, 2023 ?Sent: March 17th, 2023 ?

## 2021-11-11 ENCOUNTER — Other Ambulatory Visit: Payer: HMO

## 2021-11-11 DIAGNOSIS — E119 Type 2 diabetes mellitus without complications: Secondary | ICD-10-CM | POA: Diagnosis not present

## 2021-11-11 DIAGNOSIS — E785 Hyperlipidemia, unspecified: Secondary | ICD-10-CM | POA: Diagnosis not present

## 2021-11-12 LAB — HEMOGLOBIN A1C
Est. average glucose Bld gHb Est-mCnc: 126 mg/dL
Hgb A1c MFr Bld: 6 % — ABNORMAL HIGH (ref 4.8–5.6)

## 2021-11-12 LAB — LIPID PANEL
Chol/HDL Ratio: 2.3 ratio (ref 0.0–4.4)
Cholesterol, Total: 94 mg/dL — ABNORMAL LOW (ref 100–199)
HDL: 41 mg/dL (ref >39)
LDL Chol Calc (NIH): 25 mg/dL (ref 0–99)
Triglycerides: 175 mg/dL — ABNORMAL HIGH (ref 0–149)
VLDL Cholesterol Cal: 28 mg/dL (ref 5–40)

## 2021-11-17 ENCOUNTER — Encounter: Payer: Self-pay | Admitting: Cardiovascular Disease

## 2021-11-17 ENCOUNTER — Ambulatory Visit: Payer: HMO | Admitting: Cardiovascular Disease

## 2021-11-17 VITALS — BP 142/84 | HR 66 | Ht 61.5 in | Wt 217.0 lb

## 2021-11-17 DIAGNOSIS — G4733 Obstructive sleep apnea (adult) (pediatric): Secondary | ICD-10-CM

## 2021-11-17 DIAGNOSIS — E782 Mixed hyperlipidemia: Secondary | ICD-10-CM | POA: Diagnosis not present

## 2021-11-17 DIAGNOSIS — I2510 Atherosclerotic heart disease of native coronary artery without angina pectoris: Secondary | ICD-10-CM

## 2021-11-17 DIAGNOSIS — E119 Type 2 diabetes mellitus without complications: Secondary | ICD-10-CM

## 2021-11-17 DIAGNOSIS — Z951 Presence of aortocoronary bypass graft: Secondary | ICD-10-CM | POA: Diagnosis not present

## 2021-11-17 DIAGNOSIS — F419 Anxiety disorder, unspecified: Secondary | ICD-10-CM | POA: Diagnosis not present

## 2021-11-17 DIAGNOSIS — I10 Essential (primary) hypertension: Secondary | ICD-10-CM | POA: Diagnosis not present

## 2021-11-17 NOTE — Patient Instructions (Signed)
Medication Instructions:  ?No changes ? ? ?*If you need a refill on your cardiac medications before your next appointment, please call your pharmacy* ? ? ?Lab Work: ?Not need ? ? ? ?Testing/Procedures: ?Not needed ? ? ?Follow-Up: ?At [facility], you and your health needs are our priority.  As part of our continuing mission to provide you with exceptional heart care, we have created designated Provider Care Teams.  These Care Teams include your primary Cardiologist (physician) and Advanced Practice Providers (APPs -  Physician Assistants and Nurse Practitioners) who all work together to provide you with the care you need, when you need it. ? ?  ? ?Your next appointment:   ?12 month(s) ? ?The format for your next appointment:   ?In Person ? ?Provider:   ?Dr Shelva Majestic  ? ? ?Other Instructions  ?Will adjust water with C-PAP ?

## 2021-11-17 NOTE — Progress Notes (Signed)
? ?Cardiology Office Note   ? ?Date:  11/20/2021  ? ?ID:  Danielle Ellis, DOB 1956/01/09, MRN [id number] ? ?PCP:  [name], FNP  ?Cardiologist:  Shelva Majestic, MD (sleep); Dr Peter Martinique ? ?New sleep evaluation ? ? ?History of Present Illness:  ?Danielle Ellis is a 66 y.o. female who is followed by Dr. Peter Martinique for cardiology care.  She has a history of hypertension, type 2 diabetes mellitus, hyperlipidemia, and known CAD.  She suffered a non-STEMI on October 09, 2020 and due to multivessel CAD underwent CABG revascularization x5 on October 14, 2020 with a LIMA to LAD, SVG to diagonal, sequential SVG to OM1 and OM 2, and SVG to PDA by Dr. Gilford Raid. ? ?Due to concerns for obstructive sleep apnea, she was referred for a initial sleep study which was done at [facility] and on Jan 08, 2021.  She was found to have severe obstructive sleep apnea during the diagnostic portion of the study with an AHI of 30.7.  She had severe oxygen desaturation to a nadir of 77%.  CPAP was implemented but was suboptimal.  She subsequently underwent a BiPAP titration evaluation on February 10, 2021 and was titrated to 16 over 12 cm of water with residual AHI at 1.3/h and O2 nadir at 92%. ? ?She received her ResMed AirCurve 10 VAuto BiPAP unit on February 26, 2021.  She was seen by Fabian Sharp, PA on May 05, 2021 and was 100% compliant, feeling significantly improved with BiPAP institution.  She was unaware of breakthrough snoring and that significantly reduce daytime fatigue. ? ?She comes in the office today for her initial evaluation with me.  Upon further questioning, the patient states that years ago she was originally diagnosed with obstructive sleep apnea when she was being followed by Dr. Murray Ellis.  She used CPAP for approximately 2 years and then discontinued therapy over 6 years ago.  Presently, following reinstitution of BiPAP therapy, she feels significantly better and is tolerating BiPAP. She has had  some issues with the humidification level.  And download was obtained in the office today from March 5 through November 16, 2021 which confirms 100% use.  Average use is 8 hours and 3 minutes.  At her pressure setting of 16/12, AHI is excellent at 0.7.  There is no significant mask leak.  An Epworth Sleepiness Scale score was calculated in the office today and this endorsed at 4 arguing against residual daytime sleepiness. ? ?She is on losartan 100 mg and metoprolol succinate 50 mg daily for hypertension.  For optimal LDL lowering she is now on low-dose rosuvastatin 10 mg with Repatha 140 mg every 2-week injection.  She is on Lexapro for anxiety.  She presents for evaluation. ? ? ?Past Medical History:  ?Diagnosis Date  ? Anxiety   ? Hypertension   ? Sleep apnea   ? ? ?Past Surgical History:  ?Procedure Laterality Date  ? APPENDECTOMY    ? CORONARY ARTERY BYPASS GRAFT N/A 10/14/2020  ? Procedure: CORONARY ARTERY BYPASS GRAFTING (CABG) X FIVE, USING LEFT INTERNAL MAMMARY ARTERY AND BILATERAL LEGS GREATER SAPHENOUS VEINS HARVESTED ENDOSCOPICALLY;  Surgeon: Gaye Pollack, MD;  Location: [facility];  Service: Open Heart Surgery;  Laterality: N/A;  ? L ankle surgery    ? LEFT HEART CATH AND CORONARY ANGIOGRAPHY N/A 10/10/2020  ? Procedure: LEFT HEART CATH AND CORONARY ANGIOGRAPHY;  Surgeon: Martinique, Peter M, MD;  Location: [facility];  Service:  Cardiovascular;  Laterality: N/A;  ? TEE WITHOUT CARDIOVERSION N/A 10/14/2020  ? Procedure: TRANSESOPHAGEAL ECHOCARDIOGRAM (TEE);  Surgeon: Gaye Pollack, MD;  Location: [facility];  Service: Open Heart Surgery;  Laterality: N/A;  ? ? ?Current Medications: ?Outpatient Medications Prior to Visit  ?Medication Sig Dispense Refill  ? acetaminophen (TYLENOL) 325 MG tablet Take 650 mg by mouth every 6 (six) hours as needed.    ? aspirin EC 81 MG tablet Take 1 tablet (81 mg total) by mouth daily. Swallow whole. 90 tablet 3  ? baclofen (LIORESAL) 10 MG tablet Take 1 tablet by mouth 3 times a day  90 tablet 3  ? blood glucose meter kit and supplies Dispense based on patient and insurance preference. Use up to four times daily as directed. (FOR ICD-10 E10.9, E11.9). 1 each 0  ? calcium gluconate 500 MG tablet Take 500 mg by mouth daily.    ? escitalopram (LEXAPRO) 10 MG tablet TAKE 1 TABLET BY MOUTH DAILY 90 tablet 0  ? Evolocumab (REPATHA SURECLICK) 242 MG/ML SOAJ Inject 1 pen into the skin every 14 (fourteen) days. 2 mL 11  ? fluticasone (FLONASE) 50 MCG/ACT nasal spray Place 1 spray into both nostrils daily. 48 g 3  ? FLUZONE HIGH-DOSE QUADRIVALENT 0.7 ML SUSY     ? glucose blood (ONETOUCH ULTRA) test strip TEST BLOOD SUGAR 4 TIMES  DAILY 400 strip 3  ? Lancets (ONETOUCH ULTRASOFT) lancets TEST BLOOD SUGAR 4 TIMES  DAILY 400 each 3  ? latanoprost (XALATAN) 0.005 % ophthalmic solution Place 1 drop into both eyes at bedtime.    ? loratadine (CLARITIN) 10 MG tablet Take 10 mg by mouth daily.    ? losartan (COZAAR) 100 MG tablet Take 1 tablet  by mouth daily. 90 tablet 3  ? metFORMIN (GLUCOPHAGE) 500 MG tablet TAKE 1 TABLET BY MOUTH DAILY  WITH BREAKFAST 90 tablet 0  ? metoprolol succinate (TOPROL-XL) 50 MG 24 hr tablet Take 1 tablet  by mouth daily. Take with or immediately following a meal. 90 tablet 3  ? Multiple Vitamin (MULTIVITAMIN) capsule Take 1 capsule by mouth daily.    ? rosuvastatin (CRESTOR) 10 MG tablet Take 1 tablet by mouth daily. 90 tablet 3  ? clopidogrel (PLAVIX) 75 MG tablet TAKE 1 TABLET BY MOUTH  DAILY (Patient not taking: Reported on 11/17/2021) 90 tablet 3  ? latanoprost (XALATAN) 0.005 % ophthalmic solution Instill 1 drop into both eyes daily (Patient not taking: Reported on 11/17/2021) 7.5 mL 2  ? valACYclovir (VALTREX) 1000 MG tablet TAKE 2 TABLETS BY MOUTH  TWICE DAILY (Patient not taking: Reported on 11/17/2021) 12 tablet 1  ? ?No facility-administered medications prior to visit.  ?  ? ?Allergies:   Patient has no known allergies.  ? ?Social History  ? ?Socioeconomic History  ? Marital  status: Married  ?  Spouse name: Not on file  ? Number of children: 1  ? Years of education: Not on file  ? Highest education level: Not on file  ?Occupational History  ?  Comment: retired  ?Tobacco Use  ? Smoking status: Never  ? Smokeless tobacco: Never  ?Vaping Use  ? Vaping Use: Never used  ?Substance and Sexual Activity  ? Alcohol use: No  ? Drug use: No  ? Sexual activity: Yes  ?  Partners: Male  ?Other Topics Concern  ? Not on file  ?Social History Narrative  ? Lives with her husband. They have one daughter, [name] - She lives 5  minutes away.  ? ?Social Determinants of Health  ? ?Financial Resource Strain: Low Risk   ? Difficulty of Paying Living Expenses: Not hard at all  ?Food Insecurity: No Food Insecurity  ? Worried About Charity fundraiser in the Last Year: Never true  ? Ran Out of Food in the Last Year: Never true  ?Transportation Needs: No Transportation Needs  ? Lack of Transportation (Medical): No  ? Lack of Transportation (Non-Medical): No  ?Physical Activity: Sufficiently Active  ? Days of Exercise per Week: 7 days  ? Minutes of Exercise per Session: 50 min  ?Stress: No Stress Concern Present  ? Feeling of Stress : Only a little  ?Social Connections: Socially Integrated  ? Frequency of Communication with Friends and Family: More than three times a week  ? Frequency of Social Gatherings with Friends and Family: Once a week  ? Attends Religious Services: More than 4 times per year  ? Active Member of Clubs or Organizations: No  ? Attends Archivist Meetings: More than 4 times per year  ? Marital Status: Married  ?  ?Socially she is married for 46 years.  She has 1 daughter who is a Marine scientist.  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 ? ?Family History:  The patient's family history includes Cancer in her mother; Diabetes in her mother; Heart attack in her mother; Kidney disease in her mother.  ? ?ROS ?General: Negative; No fevers, chills, or night sweats;   ?HEENT: Negative; No changes in vision or hearing, sinus congestion, difficulty swallowing ?Pulmonary: Negative; No cough, wheezing, shortness of breath, hemoptysis ?Cardiovascular: No recurrent chest pain o

## 2021-11-19 DIAGNOSIS — G4733 Obstructive sleep apnea (adult) (pediatric): Secondary | ICD-10-CM | POA: Diagnosis not present

## 2021-11-24 ENCOUNTER — Other Ambulatory Visit (HOSPITAL_COMMUNITY): Payer: Self-pay

## 2021-11-27 DIAGNOSIS — G4733 Obstructive sleep apnea (adult) (pediatric): Secondary | ICD-10-CM | POA: Diagnosis not present

## 2021-12-09 ENCOUNTER — Ambulatory Visit (INDEPENDENT_AMBULATORY_CARE_PROVIDER_SITE_OTHER): Payer: HMO

## 2021-12-09 VITALS — Wt 217.0 lb

## 2021-12-09 DIAGNOSIS — Z0000 Encounter for general adult medical examination without abnormal findings: Secondary | ICD-10-CM | POA: Diagnosis not present

## 2021-12-09 NOTE — Patient Instructions (Signed)
Danielle Ellis , ?Thank you for taking time to come for your Medicare Wellness Visit. I appreciate your ongoing commitment to your health goals. Please review the following plan we discussed and let me know if I can assist you in the future.  ? ?Screening recommendations/referrals: ?Colonoscopy: Done 07/30/2015 - Repeat in 10 years  ?Mammogram: Done 02/24/2021 - Repeat annually ?Bone Density: Done 06/30/2018 - Repeat every 2-5 years ?Recommended yearly ophthalmology/optometry visit for glaucoma screening and checkup ?Recommended yearly dental visit for hygiene and checkup ? ?Vaccinations: ?Influenza vaccine: Done 07/08/2021 - Repeat annually ?Pneumococcal vaccine: Done 06/14/2018 & 05/23/2019 ?Tdap vaccine: Due - every 10 years ?Shingles vaccine: Due - Shingrix is 2 doses 2-6 months apart and over 90% effective     ?Covid-19:Done 10/11/2019, 11/09/2019, & 08/05/2020 ? ?Advanced directives: Please bring a copy of your health care power of attorney and living will to the office to be added to your chart at your convenience.  ? ?Conditions/risks identified: Aim for 30 minutes of exercise or brisk walking, 6-8 glasses of water, and 5 servings of fruits and vegetables each day.  ? ?Next appointment: Follow up in one year for your annual wellness visit  ? ? ?Preventive Care 67 Years and Older, Female ?Preventive care refers to lifestyle choices and visits with your health care provider that can promote health and wellness. ?What does preventive care include? ?A yearly physical exam. This is also called an annual well check. ?Dental exams once or twice a year. ?Routine eye exams. Ask your health care provider how often you should have your eyes checked. ?Personal lifestyle choices, including: ?Daily care of your teeth and gums. ?Regular physical activity. ?Eating a healthy diet. ?Avoiding tobacco and drug use. ?Limiting alcohol use. ?Practicing safe sex. ?Taking low-dose aspirin every day. ?Taking vitamin and mineral supplements  as recommended by your health care provider. ?What happens during an annual well check? ?The services and screenings done by your health care provider during your annual well check will depend on your age, overall health, lifestyle risk factors, and family history of disease. ?Counseling  ?Your health care provider may ask you questions about your: ?Alcohol use. ?Tobacco use. ?Drug use. ?Emotional well-being. ?Home and relationship well-being. ?Sexual activity. ?Eating habits. ?History of falls. ?Memory and ability to understand (cognition). ?Work and work Statistician. ?Reproductive health. ?Screening  ?You may have the following tests or measurements: ?Height, weight, and BMI. ?Blood pressure. ?Lipid and cholesterol levels. These may be checked every 5 years, or more frequently if you are over 50 years old. ?Skin check. ?Lung cancer screening. You may have this screening every year starting at age 58 if you have a 30-pack-year history of smoking and currently smoke or have quit within the past 15 years. ?Fecal occult blood test (FOBT) of the stool. You may have this test every year starting at age 78. ?Flexible sigmoidoscopy or colonoscopy. You may have a sigmoidoscopy every 5 years or a colonoscopy every 10 years starting at age 19. ?Hepatitis C blood test. ?Hepatitis B blood test. ?Sexually transmitted disease (STD) testing. ?Diabetes screening. This is done by checking your blood sugar (glucose) after you have not eaten for a while (fasting). You may have this done every 1-3 years. ?Bone density scan. This is done to screen for osteoporosis. You may have this done starting at age 40. ?Mammogram. This may be done every 1-2 years. Talk to your health care provider about how often you should have regular mammograms. ?Talk with your health care provider  about your test results, treatment options, and if necessary, the need for more tests. ?Vaccines  ?Your health care provider may recommend certain vaccines, such  as: ?Influenza vaccine. This is recommended every year. ?Tetanus, diphtheria, and acellular pertussis (Tdap, Td) vaccine. You may need a Td booster every 10 years. ?Zoster vaccine. You may need this after age 40. ?Pneumococcal 13-valent conjugate (PCV13) vaccine. One dose is recommended after age 52. ?Pneumococcal polysaccharide (PPSV23) vaccine. One dose is recommended after age 42. ?Talk to your health care provider about which screenings and vaccines you need and how often you need them. ?This information is not intended to replace advice given to you by your health care provider. Make sure you discuss any questions you have with your health care provider. ?Document Released: 08/29/2015 Document Revised: 04/21/2016 Document Reviewed: 06/03/2015 ?Elsevier Interactive Patient Education ? 2017 [place]. ? ?Fall Prevention in the Home ?Falls can cause injuries. They can happen to people of all ages. There are many things you can do to make your home safe and to help prevent falls. ?What can I do on the outside of my home? ?Regularly fix the edges of walkways and driveways and fix any cracks. ?Remove anything that might make you trip as you walk through a door, such as a raised step or threshold. ?Trim any bushes or trees on the path to your home. ?Use bright outdoor lighting. ?Clear any walking paths of anything that might make someone trip, such as rocks or tools. ?Regularly check to see if handrails are loose or broken. Make sure that both sides of any steps have handrails. ?Any raised decks and porches should have guardrails on the edges. ?Have any leaves, snow, or ice cleared regularly. ?Use sand or salt on walking paths during winter. ?Clean up any spills in your garage right away. This includes oil or grease spills. ?What can I do in the bathroom? ?Use night lights. ?Install grab bars by the toilet and in the tub and shower. Do not use towel bars as grab bars. ?Use non-skid mats or decals in the tub or  shower. ?If you need to sit down in the shower, use a plastic, non-slip stool. ?Keep the floor dry. Clean up any water that spills on the floor as soon as it happens. ?Remove soap buildup in the tub or shower regularly. ?Attach bath mats securely with double-sided non-slip rug tape. ?Do not have throw rugs and other things on the floor that can make you trip. ?What can I do in the bedroom? ?Use night lights. ?Make sure that you have a light by your bed that is easy to reach. ?Do not use any sheets or blankets that are too big for your bed. They should not hang down onto the floor. ?Have a firm chair that has side arms. You can use this for support while you get dressed. ?Do not have throw rugs and other things on the floor that can make you trip. ?What can I do in the kitchen? ?Clean up any spills right away. ?Avoid walking on wet floors. ?Keep items that you use a lot in easy-to-reach places. ?If you need to reach something above you, use a strong step stool that has a grab bar. ?Keep electrical cords out of the way. ?Do not use floor polish or wax that makes floors slippery. If you must use wax, use non-skid floor wax. ?Do not have throw rugs and other things on the floor that can make you trip. ?What can I do  with my stairs? ?Do not leave any items on the stairs. ?Make sure that there are handrails on both sides of the stairs and use them. Fix handrails that are broken or loose. Make sure that handrails are as long as the stairways. ?Check any carpeting to make sure that it is firmly attached to the stairs. Fix any carpet that is loose or worn. ?Avoid having throw rugs at the top or bottom of the stairs. If you do have throw rugs, attach them to the floor with carpet tape. ?Make sure that you have a light switch at the top of the stairs and the bottom of the stairs. If you do not have them, ask someone to add them for you. ?What else can I do to help prevent falls? ?Wear shoes that: ?Do not have high heels. ?Have  rubber bottoms. ?Are comfortable and fit you well. ?Are closed at the toe. Do not wear sandals. ?If you use a stepladder: ?Make sure that it is fully opened. Do not climb a closed stepladder. ?Make sure tha

## 2021-12-09 NOTE — Progress Notes (Signed)
? ?Subjective:  ? Danielle Ellis is a 66 y.o. female who presents for Medicare Annual (Subsequent) preventive examination. ? ?Virtual Visit via Telephone Note ? ?I connected with  Danielle Ellis on 12/09/21 at 10:30 AM EDT by telephone and verified that I am speaking with the correct person using two identifiers. ? ?Location: ?Patient: Home ?Provider: [facility] ?Persons participating in the virtual visit: patient/Nurse Health Advisor ?  ?I discussed the limitations, risks, security and privacy concerns of performing an evaluation and management service by telephone and the availability of in person appointments. The patient expressed understanding and agreed to proceed. ? ?Interactive audio and video telecommunications were attempted between this nurse and patient, however failed, due to patient having technical difficulties OR patient did not have access to video capability.  We continued and completed visit with audio only. ? ?Some vital signs may be absent or patient reported.  ? ?Amy Dionne Ano, LPN  ? ?Review of Systems    ? ?Cardiac Risk Factors include: advanced age (>64mn, >>73women);diabetes mellitus;dyslipidemia;hypertension;obesity (BMI >30kg/m2);family history of premature cardiovascular disease;Other (see comment), Risk factor comments: CAD, S/P CABG, hx of MI ? ?   ?Objective:  ?  ?Today's Vitals  ? 12/09/21 1030  ?Weight: 217 lb (98.4 kg)  ? ?Body mass index is 40.34 kg/m?. ? ? ?  12/09/2021  ? 10:36 AM 12/08/2020  ? 10:59 AM 10/10/2020  ? 12:48 AM 10/09/2020  ?  6:38 PM 12/07/2019  ? 10:39 AM  ?Advanced Directives  ?Does Patient Have a Medical Advance Directive? _0   ?Would patient like information on creating a medical advance directive? No - Patient declined Yes (MAU/Ambulatory/Procedural Areas - Information given) No - Patient declined  Yes (Inpatient - patient defers creating a medical advance directive at this time - Information given)  ? ? ?Current Medications (verified) ?Outpatient  Encounter Medications as of 12/09/2021  ?Medication Sig  ? acetaminophen (TYLENOL) 325 MG tablet Take 650 mg by mouth every 6 (six) hours as needed.  ? aspirin EC 81 MG tablet Take 1 tablet (81 mg total) by mouth daily. Swallow whole.  ? blood glucose meter kit and supplies Dispense based on patient and insurance preference. Use up to four times daily as directed. (FOR ICD-10 E10.9, E11.9).  ? calcium gluconate 500 MG tablet Take 500 mg by mouth daily.  ? escitalopram (LEXAPRO) 10 MG tablet TAKE 1 TABLET BY MOUTH DAILY  ? Evolocumab (REPATHA SURECLICK) 1932MG/ML SOAJ Inject 1 pen into the skin every 14 (fourteen) days.  ? fluticasone (FLONASE) 50 MCG/ACT nasal spray Place 1 spray into both nostrils daily.  ? glucose blood (ONETOUCH ULTRA) test strip TEST BLOOD SUGAR 4 TIMES  DAILY  ? Lancets (ONETOUCH ULTRASOFT) lancets TEST BLOOD SUGAR 4 TIMES  DAILY  ? latanoprost (XALATAN) 0.005 % ophthalmic solution Place 1 drop into both eyes at bedtime.  ? loratadine (CLARITIN) 10 MG tablet Take 10 mg by mouth daily.  ? losartan (COZAAR) 100 MG tablet Take 1 tablet  by mouth daily.  ? metFORMIN (GLUCOPHAGE) 500 MG tablet TAKE 1 TABLET BY MOUTH DAILY  WITH BREAKFAST  ? metoprolol succinate (TOPROL-XL) 50 MG 24 hr tablet Take 1 tablet  by mouth daily. Take with or immediately following a meal.  ? Multiple Vitamin (MULTIVITAMIN) capsule Take 1 capsule by mouth daily.  ? rosuvastatin (CRESTOR) 10 MG tablet Take 1 tablet by mouth daily.  ? baclofen (LIORESAL) 10 MG tablet Take 1 tablet by mouth 3  times a day (Patient not taking: Reported on 12/09/2021)  ? [DISCONTINUED] FLUZONE HIGH-DOSE QUADRIVALENT 0.7 ML SUSY   ? ?No facility-administered encounter medications on file as of 12/09/2021.  ? ? ?Allergies (verified) ?Patient has no known allergies.  ? ?History: ?Past Medical History:  ?Diagnosis Date  ? Anxiety   ? Hypertension   ? Sleep apnea   ? ?Past Surgical History:  ?Procedure Laterality Date  ? APPENDECTOMY    ? CORONARY ARTERY  BYPASS GRAFT N/A 10/14/2020  ? Procedure: CORONARY ARTERY BYPASS GRAFTING (CABG) X FIVE, USING LEFT INTERNAL MAMMARY ARTERY AND BILATERAL LEGS GREATER SAPHENOUS VEINS HARVESTED ENDOSCOPICALLY;  Surgeon: Gaye Pollack, MD;  Location: [facility];  Service: Open Heart Surgery;  Laterality: N/A;  ? L ankle surgery    ? LEFT HEART CATH AND CORONARY ANGIOGRAPHY N/A 10/10/2020  ? Procedure: LEFT HEART CATH AND CORONARY ANGIOGRAPHY;  Surgeon: Martinique, Peter M, MD;  Location: [facility];  Service: Cardiovascular;  Laterality: N/A;  ? TEE WITHOUT CARDIOVERSION N/A 10/14/2020  ? Procedure: TRANSESOPHAGEAL ECHOCARDIOGRAM (TEE);  Surgeon: Gaye Pollack, MD;  Location: [facility];  Service: Open Heart Surgery;  Laterality: N/A;  ? ?Family History  ?Problem Relation Age of Onset  ? Diabetes Mother   ? Kidney disease Mother   ? Cancer Mother   ? Heart attack Mother   ? ?Social History  ? ?Socioeconomic History  ? Marital status: Married  ?  Spouse name: Not on file  ? Number of children: 1  ? Years of education: Not on file  ? Highest education level: Not on file  ?Occupational History  ? Occupation: part Ambulance person  ?  Comment: retired  ?Tobacco Use  ? Smoking status: Never  ? Smokeless tobacco: Never  ?Vaping Use  ? Vaping Use: Never used  ?Substance and Sexual Activity  ? Alcohol use: No  ? Drug use: No  ? Sexual activity: Yes  ?  Partners: Male  ?Other Topics Concern  ? Not on file  ?Social History Narrative  ? Lives with her husband. They have one daughter, [name] - She lives 10 minutes away.  ? ?Social Determinants of Health  ? ?Financial Resource Strain: Not on file  ?Food Insecurity: Not on file  ?Transportation Needs: Not on file  ?Physical Activity: Sufficiently Active  ? Days of Exercise per Week: 7 days  ? Minutes of Exercise per Session: 40 min  ?Stress: No Stress Concern Present  ? Feeling of Stress : Only a little  ?Social Connections: Not on file  ? ? ?Tobacco Counseling ?Counseling given: Not  Answered ? ? ?Clinical Intake: ? ?Pre-visit preparation completed: Yes ? ?Pain : No/denies pain ? ?  ? ?BMI - recorded: 40.34 ?Nutritional Status: BMI > 30  Obese ?Nutritional Risks: None ?Diabetes: Yes ?CBG done?: No ?Did pt. bring in CBG monitor from home?: No ? ?How often do you need to have someone help you when you read instructions, pamphlets, or other written materials from your doctor or pharmacy?: 1 - Never ? ?Diabetic? Nutrition Risk Assessment: ? ?Has the patient had any N/V/D within the last 2 months?  No  ?Does the patient have any non-healing wounds?  No  ?Has the patient had any unintentional weight loss or weight gain?  No  ? ?Diabetes: ? ?Is the patient diabetic?  Yes  ?If diabetic, was a CBG obtained today?  No  ?Did the patient bring in their glucometer from home?  No  ?How often  do you monitor your CBG's? Once daily fasting - sometimes 2-3 times per day.  ? ?Financial Strains and Diabetes Management: ? ?Are you having any financial strains with the device, your supplies or your medication? No .  ?Does the patient want to be seen by Chronic Care Management for management of their diabetes?  No  ?Would the patient like to be referred to a Nutritionist or for Diabetic Management?  No  ? ?Diabetic Exams: ? ?Diabetic Eye Exam: Completed 08/2021.  ? ?Diabetic Foot Exam: Completed 02/02/2021. Pt has been advised about the importance in completing this exam. Pt is scheduled for diabetic foot exam on next appt in June with Christy.   ? ?Interpreter Needed?: No ? ?Information entered by :: Amy Hopkins, LPN ? ? ?Activities of Daily Living ? ?  12/09/2021  ? 10:37 AM  ?In your present state of health, do you have any difficulty performing the following activities:  ?Hearing? 0  ?Vision? 0  ?Difficulty concentrating or making decisions? 0  ?Walking or climbing stairs? 0  ?Dressing or bathing? 0  ?Doing errands, shopping? 0  ?Preparing Food and eating ? N  ?Using the Toilet? N  ?In the past six months, have you  accidently leaked urine? Y  ?Do you have problems with loss of bowel control? N  ?Managing your Medications? N  ?Managing your Finances? N  ?Housekeeping or managing your Housekeeping? N  ? ? ?Patient Care Team: ?Kayren Eaves

## 2021-12-19 ENCOUNTER — Other Ambulatory Visit (HOSPITAL_COMMUNITY): Payer: Self-pay

## 2021-12-27 DIAGNOSIS — G4733 Obstructive sleep apnea (adult) (pediatric): Secondary | ICD-10-CM | POA: Diagnosis not present

## 2022-01-13 DIAGNOSIS — G4733 Obstructive sleep apnea (adult) (pediatric): Secondary | ICD-10-CM | POA: Diagnosis not present

## 2022-01-14 ENCOUNTER — Other Ambulatory Visit (HOSPITAL_COMMUNITY): Payer: Self-pay

## 2022-01-15 ENCOUNTER — Other Ambulatory Visit: Payer: Self-pay | Admitting: Cardiology

## 2022-01-15 ENCOUNTER — Other Ambulatory Visit (HOSPITAL_COMMUNITY): Payer: Self-pay

## 2022-01-15 MED ORDER — ROSUVASTATIN CALCIUM 10 MG PO TABS
10.0000 mg | ORAL_TABLET | Freq: Every day | ORAL | 3 refills | Status: DC
  Filled 2022-01-15: qty 90, 90d supply, fill #0
  Filled 2022-06-12: qty 90, 90d supply, fill #1
  Filled 2022-09-07: qty 90, 90d supply, fill #2

## 2022-01-15 MED ORDER — METOPROLOL SUCCINATE ER 50 MG PO TB24
50.0000 mg | ORAL_TABLET | Freq: Every day | ORAL | 3 refills | Status: DC
  Filled 2022-01-15: qty 90, 90d supply, fill #0

## 2022-01-21 ENCOUNTER — Other Ambulatory Visit (HOSPITAL_COMMUNITY): Payer: Self-pay

## 2022-01-21 ENCOUNTER — Other Ambulatory Visit: Payer: Self-pay | Admitting: Family

## 2022-01-21 MED FILL — Lancets: 90 days supply | Qty: 400 | Fill #0 | Status: AC

## 2022-01-21 MED FILL — Glucose Blood Test Strip: 90 days supply | Qty: 400 | Fill #0 | Status: AC

## 2022-01-22 ENCOUNTER — Other Ambulatory Visit (HOSPITAL_COMMUNITY): Payer: Self-pay

## 2022-01-22 MED ORDER — FLUTICASONE PROPIONATE 50 MCG/ACT NA SUSP
1.0000 | Freq: Every day | NASAL | 1 refills | Status: DC
  Filled 2022-01-22: qty 16, 60d supply, fill #0
  Filled 2022-08-10 (×2): qty 16, 60d supply, fill #1
  Filled 2022-10-14: qty 16, 60d supply, fill #2
  Filled 2022-12-09: qty 16, 60d supply, fill #3

## 2022-01-25 ENCOUNTER — Other Ambulatory Visit (HOSPITAL_COMMUNITY): Payer: Self-pay

## 2022-01-27 DIAGNOSIS — G4733 Obstructive sleep apnea (adult) (pediatric): Secondary | ICD-10-CM | POA: Diagnosis not present

## 2022-02-05 ENCOUNTER — Other Ambulatory Visit: Payer: Self-pay | Admitting: Family

## 2022-02-05 DIAGNOSIS — Z1231 Encounter for screening mammogram for malignant neoplasm of breast: Secondary | ICD-10-CM

## 2022-02-11 ENCOUNTER — Telehealth: Payer: Self-pay | Admitting: Family

## 2022-02-11 ENCOUNTER — Ambulatory Visit (INDEPENDENT_AMBULATORY_CARE_PROVIDER_SITE_OTHER): Payer: HMO | Admitting: Family

## 2022-02-11 ENCOUNTER — Telehealth: Payer: Self-pay

## 2022-02-11 ENCOUNTER — Encounter: Payer: Self-pay | Admitting: Family

## 2022-02-11 ENCOUNTER — Other Ambulatory Visit (HOSPITAL_COMMUNITY): Payer: Self-pay

## 2022-02-11 VITALS — BP 138/80 | HR 69 | Temp 97.6°F | Ht 61.5 in | Wt 221.6 lb

## 2022-02-11 DIAGNOSIS — Z23 Encounter for immunization: Secondary | ICD-10-CM | POA: Diagnosis not present

## 2022-02-11 DIAGNOSIS — Z0000 Encounter for general adult medical examination without abnormal findings: Secondary | ICD-10-CM | POA: Diagnosis not present

## 2022-02-11 DIAGNOSIS — I2510 Atherosclerotic heart disease of native coronary artery without angina pectoris: Secondary | ICD-10-CM

## 2022-02-11 DIAGNOSIS — E119 Type 2 diabetes mellitus without complications: Secondary | ICD-10-CM

## 2022-02-11 DIAGNOSIS — I10 Essential (primary) hypertension: Secondary | ICD-10-CM | POA: Diagnosis not present

## 2022-02-11 DIAGNOSIS — N3941 Urge incontinence: Secondary | ICD-10-CM | POA: Diagnosis not present

## 2022-02-11 DIAGNOSIS — E782 Mixed hyperlipidemia: Secondary | ICD-10-CM | POA: Diagnosis not present

## 2022-02-11 DIAGNOSIS — Z0001 Encounter for general adult medical examination with abnormal findings: Secondary | ICD-10-CM | POA: Diagnosis not present

## 2022-02-11 DIAGNOSIS — I252 Old myocardial infarction: Secondary | ICD-10-CM | POA: Diagnosis not present

## 2022-02-11 DIAGNOSIS — F320 Major depressive disorder, single episode, mild: Secondary | ICD-10-CM | POA: Diagnosis not present

## 2022-02-11 DIAGNOSIS — Z951 Presence of aortocoronary bypass graft: Secondary | ICD-10-CM | POA: Diagnosis not present

## 2022-02-11 DIAGNOSIS — Z713 Dietary counseling and surveillance: Secondary | ICD-10-CM

## 2022-02-11 DIAGNOSIS — F411 Generalized anxiety disorder: Secondary | ICD-10-CM

## 2022-02-11 LAB — BAYER DCA HB A1C WAIVED: HB A1C (BAYER DCA - WAIVED): 5.9 % — ABNORMAL HIGH (ref 4.8–5.6)

## 2022-02-11 MED ORDER — SEMAGLUTIDE (1 MG/DOSE) 4 MG/3ML ~~LOC~~ SOPN
PEN_INJECTOR | SUBCUTANEOUS | 0 refills | Status: DC
  Filled 2022-02-11: qty 6, 56d supply, fill #0

## 2022-02-11 MED ORDER — OZEMPIC (0.25 OR 0.5 MG/DOSE) 2 MG/3ML ~~LOC~~ SOPN
PEN_INJECTOR | SUBCUTANEOUS | 1 refills | Status: AC
  Filled 2022-02-12: qty 3, 28d supply, fill #0
  Filled 2022-03-11 – 2022-03-12 (×2): qty 3, 28d supply, fill #1

## 2022-02-11 NOTE — Progress Notes (Signed)
Subjective:    Patient ID: Danielle Ellis, female    DOB: [birth year], 66 y.o.   MRN: [id number]  Chief Complaint  Patient presents with   Medical Management of Chronic Issues   Pt present to the office today CPE and chronic follow up. She is followed by Cardiologists every 6 months  for NSTEMI on 10/09/20.   She has not taken her medications this morning yet.   She is complaining of urinary frequency and has urge incontinence.   She is morbid obese with a BMI of 41 and requesting medications. She reports she has an active job, but not losing weight.  Hypertension This is a chronic problem. The current episode started more than 1 year ago. The problem has been waxing and waning since onset. The problem is uncontrolled. Associated symptoms include anxiety. Pertinent negatives include no blurred vision, malaise/fatigue, peripheral edema or shortness of breath. Risk factors for coronary artery disease include dyslipidemia, obesity and sedentary lifestyle. The current treatment provides moderate improvement. There is no history of heart failure.  Diabetes She presents for her follow-up diabetic visit. She has type 2 diabetes mellitus. Hypoglycemia symptoms include nervousness/anxiousness. Pertinent negatives for diabetes include no blurred vision and no foot paresthesias. Symptoms are stable. Diabetic complications include heart disease. Pertinent negatives for diabetic complications include no peripheral neuropathy. Risk factors for coronary artery disease include dyslipidemia, diabetes mellitus, hypertension, sedentary lifestyle and post-menopausal. She is following a generally healthy diet. Her overall blood glucose range is 110-130 mg/dl. Eye exam is current.  Hyperlipidemia This is a chronic problem. The current episode started more than 1 year ago. The problem is controlled. Recent lipid tests were reviewed and are normal. Exacerbating diseases include obesity. Pertinent negatives include no  shortness of breath. Current antihyperlipidemic treatment includes statins. The current treatment provides moderate improvement of lipids. Risk factors for coronary artery disease include diabetes mellitus, dyslipidemia, hypertension, a sedentary lifestyle and post-menopausal.  Anxiety Presents for follow-up visit. Symptoms include depressed mood, excessive worry, irritability, nervous/anxious behavior and restlessness. Patient reports no nausea or shortness of breath. Symptoms occur occasionally. The severity of symptoms is moderate.    Depression        This is a chronic problem.  The problem occurs intermittently.  Associated symptoms include restlessness.  Associated symptoms include no helplessness, no hopelessness and not sad.  Past treatments include SSRIs - Selective serotonin reuptake inhibitors.  Past medical history includes anxiety.       Review of Systems  Constitutional:  Positive for irritability. Negative for malaise/fatigue.  Eyes:  Negative for blurred vision.  Respiratory:  Negative for shortness of breath.   Gastrointestinal:  Negative for nausea.  Psychiatric/Behavioral:  Positive for depression. The patient is nervous/anxious.   All other systems reviewed and are negative.  Family History  Problem Relation Age of Onset   Diabetes Mother    Kidney disease Mother    Cancer Mother    Heart attack Mother    Social History   Socioeconomic History   Marital status: Married    Spouse name: Not on file   Number of children: 1   Years of education: Not on file   Highest education level: Not on file  Occupational History   Occupation: part time Engineer, materials    Comment: retired  Tobacco Use   Smoking status: Never   Smokeless tobacco: Never  Vaping Use   Vaping Use: Never used  Substance and Sexual Activity   Alcohol use: No  Drug use: No   Sexual activity: Yes    Partners: Male  Other Topics Concern   Not on file  Social History Narrative   Lives  with her husband. They have one daughter, [name] - She lives 10 minutes away.   Social Determinants of Health   Financial Resource Strain: Low Risk  (12/08/2020)   Overall Financial Resource Strain (CARDIA)    Difficulty of Paying Living Expenses: Not hard at all  Food Insecurity: No Food Insecurity (12/08/2020)   Hunger Vital Sign    Worried About Running Out of Food in the Last Year: Never true    Ran Out of Food in the Last Year: Never true  Transportation Needs: No Transportation Needs (12/08/2020)   PRAPARE - Hydrologist (Medical): No    Lack of Transportation (Non-Medical): No  Physical Activity: Sufficiently Active (12/09/2021)   Exercise Vital Sign    Days of Exercise per Week: 7 days    Minutes of Exercise per Session: 40 min  Stress: No Stress Concern Present (12/09/2021)   [place]    Feeling of Stress : Only a little  Social Connections: Socially Integrated (12/08/2020)   Social Connection and Isolation Panel [NHANES]    Frequency of Communication with Friends and Family: More than three times a week    Frequency of Social Gatherings with Friends and Family: Once a week    Attends Religious Services: More than 4 times per year    Active Member of [employer] or Organizations: No    Attends Music therapist: More than 4 times per year    Marital Status: Married        Objective:   Physical Exam Vitals reviewed.  Constitutional:      General: She is not in acute distress.    Appearance: She is well-developed. She is obese.  HENT:     Head: Normocephalic and atraumatic.     Right Ear: Tympanic membrane normal.     Left Ear: Tympanic membrane normal.  Eyes:     Pupils: Pupils are equal, round, and reactive to light.  Neck:     Thyroid: No thyromegaly.  Cardiovascular:     Rate and Rhythm: Normal rate and regular rhythm.     Heart sounds: Normal heart sounds. No  murmur heard. Pulmonary:     Effort: Pulmonary effort is normal. No respiratory distress.     Breath sounds: Normal breath sounds. No wheezing.  Abdominal:     General: Bowel sounds are normal. There is no distension.     Palpations: Abdomen is soft.     Tenderness: There is no abdominal tenderness.  Musculoskeletal:        General: No tenderness. Normal range of motion.     Cervical back: Normal range of motion and neck supple.  Skin:    General: Skin is warm and dry.  Neurological:     Mental Status: She is alert and oriented to person, place, and time.     Cranial Nerves: No cranial nerve deficit.     Deep Tendon Reflexes: Reflexes are normal and symmetric.  Psychiatric:        Behavior: Behavior normal.        Thought Content: Thought content normal.        Judgment: Judgment normal.    Diabetic Foot Exam - Simple   Simple Foot Form Diabetic Foot exam was performed with the following  findings: Yes 02/11/2022 10:19 AM  Visual Inspection No deformities, no ulcerations, no other skin breakdown bilaterally: Yes Sensation Testing Intact to touch and monofilament testing bilaterally: Yes Pulse Check Posterior Tibialis and Dorsalis pulse intact bilaterally: Yes Comments      BP (!) 152/78   Pulse 69   Temp 97.6 F (36.4 C)   Ht 5' 1.5" (1.562 m)   Wt 221 lb 9.6 oz (100.5 kg)   SpO2 95%   BMI 41.19 kg/m      Assessment & Plan:  Danielle Ellis comes in today with chief complaint of Medical Management of Chronic Issues   Diagnosis and orders addressed:  1. Need for vaccination against Streptococcus pneumoniae - Pneumococcal polysaccharide vaccine 23-valent greater than or equal to 2yo subcutaneous/IM - CMP14+EGFR - CBC with Differential/Platelet  2. Hypertension, unspecified type - CMP14+EGFR - CBC with Differential/Platelet  3. Coronary artery disease involving native coronary artery of native heart without angina pectoris - CMP14+EGFR - CBC with  Differential/Platelet  4. Type 2 diabetes mellitus without complication, without long-term current use of insulin (HCC) -Start Ozempic 0.25 mg for one month then increase to 0.5 mg  Encourage healthy diet and exercise  - Semaglutide, 1 MG/DOSE, 4 MG/3ML SOPN; Inject 0.25 mg as directed once a week for 28 days, THEN 0.5 mg once a week for 28 days.  Dispense: 6 mL; Refill: 0 - Microalbumin / creatinine urine ratio - Bayer DCA Hb A1c Waived - CMP14+EGFR - CBC with Differential/Platelet  5. S/P CABG x 5 - CMP14+EGFR - CBC with Differential/Platelet  6. Mixed hyperlipidemia - Semaglutide, 1 MG/DOSE, 4 MG/3ML SOPN; Inject 0.25 mg as directed once a week for 28 days, THEN 0.5 mg once a week for 28 days.  Dispense: 6 mL; Refill: 0 - CMP14+EGFR - CBC with Differential/Platelet - Lipid panel  7. History of non-ST elevation myocardial infarction (NSTEMI) - CMP14+EGFR - CBC with Differential/Platelet  8. GAD (generalized anxiety disorder) - CMP14+EGFR - CBC with Differential/Platelet  9. Depression, major, single episode, mild (HCC) - CMP14+EGFR - CBC with Differential/Platelet  10. Morbid obesity (HCC) - Semaglutide, 1 MG/DOSE, 4 MG/3ML SOPN; Inject 0.25 mg as directed once a week for 28 days, THEN 0.5 mg once a week for 28 days.  Dispense: 6 mL; Refill: 0 - CMP14+EGFR - CBC with Differential/Platelet  11. Weight loss counseling, encounter for - Semaglutide, 1 MG/DOSE, 4 MG/3ML SOPN; Inject 0.25 mg as directed once a week for 28 days, THEN 0.5 mg once a week for 28 days.  Dispense: 6 mL; Refill: 0 - CMP14+EGFR - CBC with Differential/Platelet  12. Urge incontinence - CMP14+EGFR - CBC with Differential/Platelet  13. Annual physical exam - Bayer DCA Hb A1c Waived - CMP14+EGFR - CBC with Differential/Platelet - Lipid panel - TSH   Labs pending Health Maintenance reviewed Diet and exercise encouraged  Follow up plan: 2 months to recheck [place],  FNP

## 2022-02-11 NOTE — Telephone Encounter (Signed)
Pt needs the rx to go to [facility].

## 2022-02-11 NOTE — Telephone Encounter (Signed)
[employer] (Key: [id number]) Rx #: [id number] Ozempic (0.25 or 0.5 MG/DOSE) 2MG /3ML pen-injectors   Form [facility] [employer] Electronic Prior Authorization Form 2017 NCPDP Created 15 minutes ago Sent to Plan 3 minutes ago Plan Response 3 minutes ago Submit Clinical Questions less than a minute ago Determination Wait for Determination Please wait for [employer] [employer] 2017 to return a determination.

## 2022-02-11 NOTE — Telephone Encounter (Signed)
Pt called to let PCP know that Ozempic Rx is requiring a PA

## 2022-02-11 NOTE — Telephone Encounter (Signed)
Pt aware of approval by vm.

## 2022-02-11 NOTE — Telephone Encounter (Signed)
Ok

## 2022-02-11 NOTE — Telephone Encounter (Signed)
Danielle Ellis (Key: [id number]) Rx #: [id number] Ozempic (0.25 or 0.5 MG/DOSE) 2MG pen-injectors   Form [facility] [employer] Electronic Prior Authorization Form 2017 NCPDP Created 30 minutes ago Sent to Plan 17 minutes ago Plan Response 17 minutes ago Submit Clinical Questions 15 minutes ago Determination Favorable 1 minute ago Message from Plan 29-JUN-23:31-DEC-23 Quantity:; Quantity:;Ozempic (0.25 or 0.5 MG/DOSE) 2MG /3ML [place] SOPN Quantity:3;

## 2022-02-11 NOTE — Patient Instructions (Signed)
Urinary Incontinence Urinary incontinence refers to a condition in which a person is unable to control where and when to pass urine. A person with this condition will urinate involuntarily. This means that the person urinates when he or she does not mean to. What are the causes? This condition may be caused by: Medicines. Infections. Constipation. Overactive bladder muscles. Weak bladder muscles. Weak pelvic floor muscles. These muscles provide support for the bladder, intestine, and, in women, the uterus. Enlarged prostate in men. The prostate is a gland near the bladder. When it gets too big, it can pinch the urethra. With the urethra blocked, the bladder can weaken and lose the ability to empty properly. Surgery. Emotional factors, such as anxiety, stress, or post-traumatic stress disorder (PTSD). Spinal cord injury, nerve injury, or other neurological conditions. Pelvic organ prolapse. This happens in women when organs move out of place and into the vagina. This movement can prevent the bladder and urethra from working properly. What increases the risk? The following factors may make you more likely to develop this condition: Age. The older you are, the higher the risk. Obesity. Being physically inactive. Pregnancy and childbirth. Menopause. Diseases that affect the nerves or spinal cord. Long-term, or chronic, coughing. This can increase pressure on the bladder and pelvic floor muscles. What are the signs or symptoms? Symptoms may vary depending on the type of urinary incontinence you have. They include: A sudden urge to urinate, and passing urine involuntarily before you can get to a bathroom (urge incontinence). Suddenly passing urine when doing activities that force urine to pass, such as coughing, laughing, exercising, or sneezing (stress incontinence). Needing to urinate often but urinating only a small amount, or constantly dribbling urine (overflow incontinence). Urinating  because you cannot get to the bathroom in time due to a physical disability, such as arthritis or injury, or due to a communication or thinking problem, such as Alzheimer's disease (functional incontinence). How is this diagnosed? This condition may be diagnosed based on: Your medical history. A physical exam. Tests, such as: Urine tests. X-rays of your kidney and bladder. Ultrasound. CT scan. Cystoscopy. In this procedure, a health care provider inserts a tube with a light and camera (cystoscope) through the urethra and into the bladder to check for problems. Urodynamic testing. These tests assess how well the bladder, urethra, and sphincter can store and release urine. There are different types of urodynamic tests, and they vary depending on what the test is measuring. To help diagnose your condition, your health care provider may recommend that you keep a log of when you urinate and how much you urinate. How is this treated? Treatment for this condition depends on the type of incontinence that you have and its cause. Treatment may include: Lifestyle changes, such as: Quitting smoking. Maintaining a healthy weight. Staying active. Try to get 150 minutes of moderate-intensity exercise every week. Ask your health care provider which activities are safe for you. Eating a healthy diet. Avoid high-fat foods, like fried foods. Avoid refined carbohydrates like white bread and white rice. Limit how much alcohol and caffeine you drink. Increase your fiber intake. Healthy sources of fiber include beans, whole grains, and fresh fruits and vegetables. Behavioral changes, such as: Pelvic floor muscle exercises. Bladder training, such as lengthening the amount of time between bathroom breaks, or using the bathroom at regular intervals. Using techniques to suppress bladder urges. This can include distraction techniques or controlled breathing exercises. Medicines, such as: Medicines to relax the  bladder   muscles and prevent bladder spasms. Medicines to help slow or prevent the growth of a man's prostate. Botox injections. These can help relax the bladder muscles. Treatments, such as: Using pulses of electricity to help change bladder reflexes (electrical nerve stimulation). For women, using a medical device to prevent urine leaks. This is a small, tampon-like, disposable device that is inserted into the urethra. Injecting collagen or carbon beads (bulking agents) into the urinary sphincter. These can help thicken tissue and close the bladder opening. Surgery. Follow these instructions at home: Lifestyle Limit alcohol and caffeine. These can fill your bladder quickly and irritate it. Keep yourself clean to help prevent odors and skin damage. Ask your health care provider about special skin creams and cleansers that can protect the skin from urine. Consider wearing pads or adult diapers. Make sure to change them regularly, and always change them right after experiencing incontinence. General instructions Take over-the-counter and prescription medicines only as told by your health care provider. Use the bathroom about every 3-4 hours, even if you do not feel the need to urinate. Try to empty your bladder completely every time. After urinating, wait a minute. Then try to urinate again. Make sure you are in a relaxed position while urinating. If your incontinence is caused by nerve problems, keep a log of the medicines you take and the times you go to the bathroom. Keep all follow-up visits. This is important. Where to find more information National Institute of Diabetes and Digestive and Kidney Diseases: www.niddk.nih.gov American Urology Association: www.urologyhealth.org Contact a health care provider if: You have pain that gets worse. Your incontinence gets worse. Get help right away if: You have a fever or chills. You are unable to urinate. You have redness in your groin area or  down your legs. Summary Urinary incontinence refers to a condition in which a person is unable to control where and when to pass urine. This condition may be caused by medicines, infection, weak bladder muscles, weak pelvic floor muscles, enlargement of the prostate (in men), or surgery. Factors such as older age, obesity, pregnancy and childbirth, menopause, neurological diseases, and chronic coughing may increase your risk for developing this condition. Types of urinary incontinence include urge incontinence, stress incontinence, overflow incontinence, and functional incontinence. This condition is usually treated first with lifestyle and behavioral changes, such as quitting smoking, eating a healthier diet, and doing regular pelvic floor exercises. Other treatment options include medicines, bulking agents, medical devices, electrical nerve stimulation, or surgery. This information is not intended to replace advice given to you by your health care provider. Make sure you discuss any questions you have with your health care provider. Document Revised: 03/07/2020 Document Reviewed: 03/07/2020 Elsevier Patient Education  2023 Elsevier Inc.  

## 2022-02-12 ENCOUNTER — Other Ambulatory Visit (HOSPITAL_COMMUNITY): Payer: Self-pay

## 2022-02-12 LAB — CMP14+EGFR
ALT: 45 IU/L — ABNORMAL HIGH (ref 0–32)
AST: 28 IU/L (ref 0–40)
Albumin/Globulin Ratio: 1.9 (ref 1.2–2.2)
Albumin: 4.6 g/dL (ref 3.8–4.8)
Alkaline Phosphatase: 171 IU/L — ABNORMAL HIGH (ref 44–121)
BUN/Creatinine Ratio: 19 (ref 12–28)
BUN: 17 mg/dL (ref 8–27)
Bilirubin Total: 0.4 mg/dL (ref 0.0–1.2)
CO2: 22 mmol/L (ref 20–29)
Calcium: 9.7 mg/dL (ref 8.7–10.3)
Chloride: 106 mmol/L (ref 96–106)
Creatinine, Ser: 0.88 mg/dL (ref 0.57–1.00)
Globulin, Total: 2.4 g/dL (ref 1.5–4.5)
Glucose: 108 mg/dL — ABNORMAL HIGH (ref 70–99)
Potassium: 4.5 mmol/L (ref 3.5–5.2)
Sodium: 143 mmol/L (ref 134–144)
Total Protein: 7 g/dL (ref 6.0–8.5)
eGFR: 72 mL/min/{1.73_m2} (ref >59)

## 2022-02-12 LAB — CBC WITH DIFFERENTIAL/PLATELET
Basophils Absolute: 0 10*3/uL (ref 0.0–0.2)
Basos: 0 %
EOS (ABSOLUTE): 0.1 10*3/uL (ref 0.0–0.4)
Eos: 2 %
Hematocrit: 40.7 % (ref 34.0–46.6)
Hemoglobin: 13.4 g/dL (ref 11.1–15.9)
Immature Grans (Abs): 0 10*3/uL (ref 0.0–0.1)
Immature Granulocytes: 0 %
Lymphocytes Absolute: 2.1 10*3/uL (ref 0.7–3.1)
Lymphs: 39 %
MCH: 28 pg (ref 26.6–33.0)
MCHC: 32.9 g/dL (ref 31.5–35.7)
MCV: 85 fL (ref 79–97)
Monocytes Absolute: 0.4 10*3/uL (ref 0.1–0.9)
Monocytes: 7 %
Neutrophils Absolute: 2.7 10*3/uL (ref 1.4–7.0)
Neutrophils: 52 %
Platelets: 167 10*3/uL (ref 150–450)
RBC: 4.79 x10E6/uL (ref 3.77–5.28)
RDW: 13.2 % (ref 11.7–15.4)
WBC: 5.3 10*3/uL (ref 3.4–10.8)

## 2022-02-12 LAB — LIPID PANEL
Chol/HDL Ratio: 2.2 ratio (ref 0.0–4.4)
Cholesterol, Total: 97 mg/dL — ABNORMAL LOW (ref 100–199)
HDL: 44 mg/dL (ref >39)
LDL Chol Calc (NIH): 23 mg/dL (ref 0–99)
Triglycerides: 188 mg/dL — ABNORMAL HIGH (ref 0–149)
VLDL Cholesterol Cal: 30 mg/dL (ref 5–40)

## 2022-02-12 LAB — MICROALBUMIN / CREATININE URINE RATIO
Creatinine, Urine: 56.1 mg/dL
Microalb/Creat Ratio: <5 mg/g creat (ref 0–29)
Microalbumin, Urine: <3 ug/mL

## 2022-02-12 LAB — TSH: TSH: 2.22 u[IU]/mL (ref 0.450–4.500)

## 2022-02-15 ENCOUNTER — Other Ambulatory Visit (HOSPITAL_COMMUNITY): Payer: Self-pay

## 2022-02-25 ENCOUNTER — Ambulatory Visit
Admission: RE | Admit: 2022-02-25 | Discharge: 2022-02-25 | Disposition: A | Discharge status: Home / Self Care | Payer: HMO | Source: Ambulatory Visit | Attending: Family | Admitting: Family

## 2022-02-25 ENCOUNTER — Ambulatory Visit: Payer: HMO

## 2022-02-25 DIAGNOSIS — Z1231 Encounter for screening mammogram for malignant neoplasm of breast: Secondary | ICD-10-CM | POA: Diagnosis not present

## 2022-02-26 DIAGNOSIS — G4733 Obstructive sleep apnea (adult) (pediatric): Secondary | ICD-10-CM | POA: Diagnosis not present

## 2022-03-04 ENCOUNTER — Telehealth: Payer: Self-pay | Admitting: *Deleted

## 2022-03-04 DIAGNOSIS — G4733 Obstructive sleep apnea (adult) (pediatric): Secondary | ICD-10-CM

## 2022-03-04 NOTE — Telephone Encounter (Signed)
Patient called in for cpap supplies today an order was sent to [facility].

## 2022-03-05 NOTE — Telephone Encounter (Signed)
Patient is following up to confirm the CPAP supply order was sent to [facility] in [place]. She states they have not received it. Please advise.

## 2022-03-11 ENCOUNTER — Other Ambulatory Visit (HOSPITAL_COMMUNITY): Payer: Self-pay

## 2022-03-11 ENCOUNTER — Telehealth: Payer: Self-pay | Admitting: Cardiology

## 2022-03-11 NOTE — Telephone Encounter (Signed)
Pt called stating that she has been trying to receive her CPAP supplies for 2 weeks now. She states that she was told that we placed an order to [place], however they are saying that they haven't received it. Pt wanted to speak to [place]. Per secure chat, no answer. Pt disconnected call. Please advise.

## 2022-03-12 ENCOUNTER — Telehealth: Payer: Self-pay | Admitting: Family

## 2022-03-12 ENCOUNTER — Other Ambulatory Visit (HOSPITAL_COMMUNITY): Payer: Self-pay

## 2022-03-12 DIAGNOSIS — I252 Old myocardial infarction: Secondary | ICD-10-CM

## 2022-03-12 DIAGNOSIS — Z951 Presence of aortocoronary bypass graft: Secondary | ICD-10-CM

## 2022-03-12 DIAGNOSIS — E1169 Type 2 diabetes mellitus with other specified complication: Secondary | ICD-10-CM

## 2022-03-12 NOTE — Telephone Encounter (Signed)
Referral to CCM placed 

## 2022-03-12 NOTE — Telephone Encounter (Signed)
Spoke with patient she is needing Patient assistance for ozempic can we put in CCM referral patient is in the donut hole for her insurance

## 2022-03-15 ENCOUNTER — Other Ambulatory Visit (HOSPITAL_COMMUNITY): Payer: Self-pay

## 2022-03-16 ENCOUNTER — Other Ambulatory Visit (HOSPITAL_COMMUNITY): Payer: Self-pay

## 2022-03-17 ENCOUNTER — Other Ambulatory Visit (HOSPITAL_COMMUNITY): Payer: Self-pay

## 2022-03-17 ENCOUNTER — Other Ambulatory Visit: Payer: Self-pay | Admitting: Family

## 2022-03-17 DIAGNOSIS — F411 Generalized anxiety disorder: Secondary | ICD-10-CM

## 2022-03-17 DIAGNOSIS — F320 Major depressive disorder, single episode, mild: Secondary | ICD-10-CM

## 2022-03-17 DIAGNOSIS — R7303 Prediabetes: Secondary | ICD-10-CM

## 2022-03-17 MED ORDER — METFORMIN HCL 500 MG PO TABS
500.0000 mg | ORAL_TABLET | Freq: Every day | ORAL | 0 refills | Status: DC
  Filled 2022-03-17: qty 90, 90d supply, fill #0

## 2022-03-17 MED ORDER — ESCITALOPRAM OXALATE 10 MG PO TABS
10.0000 mg | ORAL_TABLET | Freq: Every day | ORAL | 0 refills | Status: DC
  Filled 2022-03-17: qty 90, 90d supply, fill #0

## 2022-03-19 ENCOUNTER — Other Ambulatory Visit: Payer: Self-pay | Admitting: Family

## 2022-03-19 DIAGNOSIS — I10 Essential (primary) hypertension: Secondary | ICD-10-CM

## 2022-03-19 DIAGNOSIS — E1169 Type 2 diabetes mellitus with other specified complication: Secondary | ICD-10-CM

## 2022-03-19 DIAGNOSIS — G4733 Obstructive sleep apnea (adult) (pediatric): Secondary | ICD-10-CM | POA: Diagnosis not present

## 2022-03-27 NOTE — Telephone Encounter (Signed)
Order resent to [employer].

## 2022-03-31 ENCOUNTER — Telehealth: Payer: Self-pay

## 2022-03-31 NOTE — Chronic Care Management (AMB) (Signed)
  Chronic Care Management   Note  03/31/2022 Name: Danielle Ellis MRN: [id number] DOB: [birth year]  Danielle Ellis is a 66 y.o. year old female who is a primary care patient of [name], FNP. I reached out to [place] by phone today in response to a referral sent by [name] PCP.  [name] was given information about Chronic Care Management services today including:  CCM service includes personalized support from designated clinical staff supervised by her physician, including individualized plan of care and coordination with other care providers 24/7 contact phone numbers for assistance for urgent and routine care needs. Service will only be billed when office clinical staff spend 20 minutes or more in a month to coordinate care. Only one practitioner may furnish and bill the service in a calendar month. The patient may stop CCM services at any time (effective at the end of the month) by phone call to the office staff. The patient is responsible for co-pay (up to 20% after annual deductible is met) if co-pay is required by the individual health plan.   Patient agreed to services and verbal consent obtained.   Follow up plan: Telephone appointment with care management team member scheduled for:05/05/2022  Noreene Larsson, [place],  [address] Direct Dial: [phone] [email or web address]

## 2022-04-06 ENCOUNTER — Other Ambulatory Visit (HOSPITAL_COMMUNITY): Payer: Self-pay

## 2022-04-06 ENCOUNTER — Telehealth: Payer: Self-pay | Admitting: Family

## 2022-04-06 LAB — HM DIABETES EYE EXAM

## 2022-04-06 MED ORDER — LATANOPROST 0.005 % OP SOLN
OPHTHALMIC | 6 refills | Status: AC
  Filled 2022-04-06: qty 2.5, 25d supply, fill #0
  Filled 2022-08-10 (×2): qty 2.5, 25d supply, fill #1
  Filled 2022-09-07: qty 2.5, 25d supply, fill #2
  Filled 2022-10-14: qty 2.5, 25d supply, fill #3
  Filled 2022-11-06: qty 7.5, 75d supply, fill #4

## 2022-04-06 NOTE — Telephone Encounter (Signed)
Patient aware and verbalized understanding. °

## 2022-04-06 NOTE — Telephone Encounter (Signed)
OKAY to give 1 sample Let patient know we filled her patient assistance application and she should receive a letter in the mail from [employer] patient assistance program We will notify her when ozempic is at our office for pick up This can take up to 4 weeks  Thanks! [name]

## 2022-04-06 NOTE — Telephone Encounter (Signed)
Okay to give sample 

## 2022-04-10 ENCOUNTER — Other Ambulatory Visit: Payer: Self-pay | Admitting: Family

## 2022-04-10 ENCOUNTER — Other Ambulatory Visit: Payer: Self-pay

## 2022-04-10 DIAGNOSIS — R7303 Prediabetes: Secondary | ICD-10-CM

## 2022-04-10 DIAGNOSIS — F320 Major depressive disorder, single episode, mild: Secondary | ICD-10-CM

## 2022-04-10 DIAGNOSIS — F411 Generalized anxiety disorder: Secondary | ICD-10-CM

## 2022-04-11 MED ORDER — ESCITALOPRAM OXALATE 10 MG PO TABS
10.0000 mg | ORAL_TABLET | Freq: Every day | ORAL | 0 refills | Status: DC
  Filled 2022-04-11 – 2022-06-12 (×3): qty 90, 90d supply, fill #0

## 2022-04-11 MED ORDER — METFORMIN HCL 500 MG PO TABS
500.0000 mg | ORAL_TABLET | Freq: Every day | ORAL | 0 refills | Status: DC
  Filled 2022-04-11 – 2022-04-16 (×2): qty 90, 90d supply, fill #0

## 2022-04-12 ENCOUNTER — Other Ambulatory Visit (HOSPITAL_COMMUNITY): Payer: Self-pay

## 2022-04-15 ENCOUNTER — Encounter: Payer: Self-pay | Admitting: Family

## 2022-04-15 ENCOUNTER — Ambulatory Visit (INDEPENDENT_AMBULATORY_CARE_PROVIDER_SITE_OTHER): Payer: HMO | Admitting: Family

## 2022-04-15 VITALS — BP 138/80 | HR 81 | Temp 97.8°F | Ht 61.5 in | Wt 214.6 lb

## 2022-04-15 DIAGNOSIS — I10 Essential (primary) hypertension: Secondary | ICD-10-CM | POA: Diagnosis not present

## 2022-04-15 DIAGNOSIS — Z951 Presence of aortocoronary bypass graft: Secondary | ICD-10-CM | POA: Diagnosis not present

## 2022-04-15 DIAGNOSIS — E782 Mixed hyperlipidemia: Secondary | ICD-10-CM

## 2022-04-15 DIAGNOSIS — I252 Old myocardial infarction: Secondary | ICD-10-CM | POA: Diagnosis not present

## 2022-04-15 DIAGNOSIS — E1169 Type 2 diabetes mellitus with other specified complication: Secondary | ICD-10-CM

## 2022-04-15 LAB — CMP14+EGFR
ALT: 30 IU/L (ref 0–32)
AST: 26 IU/L (ref 0–40)
Albumin/Globulin Ratio: 2 (ref 1.2–2.2)
Albumin: 4.7 g/dL (ref 3.9–4.9)
Alkaline Phosphatase: 136 IU/L — ABNORMAL HIGH (ref 44–121)
BUN/Creatinine Ratio: 13 (ref 12–28)
BUN: 11 mg/dL (ref 8–27)
Bilirubin Total: 0.5 mg/dL (ref 0.0–1.2)
CO2: 24 mmol/L (ref 20–29)
Calcium: 9.7 mg/dL (ref 8.7–10.3)
Chloride: 104 mmol/L (ref 96–106)
Creatinine, Ser: 0.87 mg/dL (ref 0.57–1.00)
Globulin, Total: 2.4 g/dL (ref 1.5–4.5)
Glucose: 104 mg/dL — ABNORMAL HIGH (ref 70–99)
Potassium: 4.2 mmol/L (ref 3.5–5.2)
Sodium: 141 mmol/L (ref 134–144)
Total Protein: 7.1 g/dL (ref 6.0–8.5)
eGFR: 73 mL/min/{1.73_m2} (ref >59)

## 2022-04-15 MED ORDER — SEMAGLUTIDE (1 MG/DOSE) 4 MG/3ML ~~LOC~~ SOPN: 1.0000 mg | PEN_INJECTOR | SUBCUTANEOUS | 2 refills | Status: DC

## 2022-04-15 NOTE — Patient Instructions (Signed)

## 2022-04-15 NOTE — Progress Notes (Signed)
Subjective:    Patient ID: Danielle Ellis, female    DOB: 1955/09/22, 66 y.o.   MRN: [id number]  Chief Complaint  Patient presents with   Follow-up    2 mth rck on ozempic    PT presents to the office today to recheck weight loss. She was started Ozempic 0.5 mg. She has lost 7 lbs. States her A1C has been 118.      04/15/2022    8:55 AM 02/11/2022    9:53 AM 12/09/2021   10:30 AM  Last 3 Weights  Weight (lbs) 214 lb 9.6 oz 221 lb 9.6 oz 217 lb  Weight (kg) 97.342 kg 100.517 kg 98.431 kg     She has hx of CABG and NSTEMI. Followed by Cardiologists every 6  months.  Diabetes She presents for her follow-up diabetic visit. She has type 2 diabetes mellitus. Pertinent negatives for diabetes include no blurred vision and no foot paresthesias. Symptoms are stable. Risk factors for coronary artery disease include dyslipidemia, diabetes mellitus, hypertension and sedentary lifestyle. She is following a generally healthy diet. Her overall blood glucose range is 90-110 mg/dl.  Hypertension This is a chronic problem. The current episode started more than 1 year ago. The problem has been waxing and waning since onset. The problem is uncontrolled. Associated symptoms include malaise/fatigue. Pertinent negatives include no blurred vision. Treatments tried: has not taken medications this morning.  Hyperlipidemia This is a chronic problem. The current episode started more than 1 year ago. Recent lipid tests were reviewed and are normal. Current antihyperlipidemic treatment includes diet change. The current treatment provides moderate improvement of lipids.      Review of Systems  Constitutional:  Positive for malaise/fatigue.  Eyes:  Negative for blurred vision.  All other systems reviewed and are negative.      Objective:   Physical Exam Vitals reviewed.  Constitutional:      General: She is not in acute distress.    Appearance: She is well-developed. She is obese.  HENT:     Head:  Normocephalic and atraumatic.     Right Ear: Tympanic membrane normal.     Left Ear: Tympanic membrane normal.  Eyes:     Pupils: Pupils are equal, round, and reactive to light.  Neck:     Thyroid: No thyromegaly.  Cardiovascular:     Rate and Rhythm: Normal rate and regular rhythm.     Heart sounds: Normal heart sounds. No murmur heard. Pulmonary:     Effort: Pulmonary effort is normal. No respiratory distress.     Breath sounds: Normal breath sounds. No wheezing.  Abdominal:     General: Bowel sounds are normal. There is no distension.     Palpations: Abdomen is soft.     Tenderness: There is no abdominal tenderness.  Musculoskeletal:        General: No tenderness. Normal range of motion.     Cervical back: Normal range of motion and neck supple.  Skin:    General: Skin is warm and dry.  Neurological:     Mental Status: She is alert and oriented to person, place, and time.     Cranial Nerves: No cranial nerve deficit.     Deep Tendon Reflexes: Reflexes are normal and symmetric.  Psychiatric:        Behavior: Behavior normal.        Thought Content: Thought content normal.        Judgment: Judgment normal.  BP (!) 172/79   Pulse 81   Temp 97.8 F (36.6 C) (Temporal)   Ht 5' 1.5" (1.562 m)   Wt 214 lb 9.6 oz (97.3 kg)   SpO2 93%   BMI 39.89 kg/m      Assessment & Plan:  Danielle Ellis comes in today with chief complaint of Follow-up (2 mth rck on ozempic )   Diagnosis and orders addressed:  1. Type 2 diabetes mellitus with other specified complication, without long-term current use of insulin (HCC) Will increase Ozempic to 1 mg from 0.5 mg  Low carb diet  Encourage healthy diet and exercise  - Semaglutide, 1 MG/DOSE, 4 MG/3ML SOPN; Inject 1 mg as directed once a week.  Dispense: 3 mL; Refill: 2 - CMP14+EGFR  2. Hypertension, unspecified type - CMP14+EGFR  3. Morbid obesity (HCC) - Semaglutide, 1 MG/DOSE, 4 MG/3ML SOPN; Inject 1 mg as directed once  a week.  Dispense: 3 mL; Refill: 2 - CMP14+EGFR  4. Mixed hyperlipidemia  - CMP14+EGFR  5. S/P CABG x 5 - CMP14+EGFR  6. History of non-ST elevation myocardial infarction (NSTEMI) - CMP14+EGFR   Labs pending Health Maintenance reviewed Diet and exercise encouraged  Follow up plan: 6 months    Evelina Dun, FNP

## 2022-04-16 ENCOUNTER — Other Ambulatory Visit (HOSPITAL_COMMUNITY): Payer: Self-pay

## 2022-04-26 NOTE — Telephone Encounter (Signed)
Pt got a letter in the mail today stating that her application was not filled out correctly. Pt says that the application is missing information--pt says that letter encourages pt to contact pcp office to have application resubmitted. Pt says that she can bring the letter to show julie or pt says that she is willing to come in for apt with Raynelle Fanning on the 05/05/2022 instead of televisit if necessary and bring the letter. (if not too late). Please call back. Pt aware Raynelle Fanning is off on Mondays and will address tomorrow.

## 2022-04-30 NOTE — Telephone Encounter (Signed)
Patient aware and verbalized understanding. °

## 2022-04-30 NOTE — Telephone Encounter (Signed)
[name], Can you see what was filled out wrong?! I can correct   Clinical, Okay to give patient a sample  Thank you, all!!

## 2022-05-02 NOTE — Progress Notes (Unsigned)
Cardiology Office Note   Date:  05/05/2022   ID:  Danielle Ellis, DOB [birth year], MRN [id number]  PCP:  Danielle Balloon, FNP  Cardiologist:   Danielle Klutz [place], MD   Chief Complaint  Patient presents with   Coronary Artery Disease   Hypertension       History of Present Illness: [name] is a 66 y.o. female who presents for follow up CAD. She has a a PMH of hypertension, coronary artery disease status post NSTEMI 10/09/2020, type 2 diabetes, hyperlipidemia, generalized anxiety disorder, CABG x5 on 10/14/2020 (LIMA-LAD, SVG-diagonal, SVG-OM1/OM2, and SVG-PDA)   She was admitted to the hospital on 10/10/2020.  She reported several week history of recurrent episodes of substernal chest pain that would radiate to her left shoulder and left hand.  She also noted some associated shortness of breath diaphoresis and nausea.  She was noted to have mildly elevated troponins and her EKG showed diffuse ST depressions  with new T wave inversions inferiorly.  Echocardiogram showed normal LVEF with no significant valvular abnormalities.  She underwent cardiac catheterization which showed severe multivessel disease.  She underwent CABG x5 on 10/14/2020.   She maintained sinus rhythm.  Due to her NSTEMI she was placed on Plavix and her aspirin was decreased to 81 mg daily.   She was discharged in stable condition on 10/19/2020. When seen in March it was noted she had discontinued CPAP therapy several years before and sleep study was ordered. This was performed and BIPAP was recommended with follow up in sleep clinic.   She was seen back in September and was on BIPAP with significant improvement in symptoms. Was referred to lipid clinic since not at goal on maximally tolerated statin. PCSK 9 inhibitor was started. Tolerating this well.   She states she feels great. No chest pain or dyspnea. No palpitations, dizziness or edema. Tolerating medication well. She does bring BP records with BP ranging from 433-295  systolic. She was started on Ozempic this summer and has lost 14 lbs. She is very active and still working part time in [employer].     Past Medical History:  Diagnosis Date   Anxiety    Hypertension    Sleep apnea     Past Surgical History:  Procedure Laterality Date   APPENDECTOMY     CORONARY ARTERY BYPASS GRAFT N/A 10/14/2020   Procedure: CORONARY ARTERY BYPASS GRAFTING (CABG) X FIVE, USING LEFT INTERNAL MAMMARY ARTERY AND BILATERAL LEGS GREATER SAPHENOUS VEINS HARVESTED ENDOSCOPICALLY;  Surgeon: Danielle Pollack, MD;  Location: [facility];  Service: Open Heart Surgery;  Laterality: N/A;   L ankle surgery     LEFT HEART CATH AND CORONARY ANGIOGRAPHY N/A 10/10/2020   Procedure: LEFT HEART CATH AND CORONARY ANGIOGRAPHY;  Surgeon: Ellis, Danielle Malkin M, MD;  Location: [facility];  Service: Cardiovascular;  Laterality: N/A;   TEE WITHOUT CARDIOVERSION N/A 10/14/2020   Procedure: TRANSESOPHAGEAL ECHOCARDIOGRAM (TEE);  Surgeon: Danielle Pollack, MD;  Location: [facility];  Service: Open Heart Surgery;  Laterality: N/A;     Current Outpatient Medications  Medication Sig Dispense Refill   acetaminophen (TYLENOL) 325 MG tablet Take 650 mg by mouth every 6 (six) hours as needed.     aspirin EC 81 MG tablet Take 1 tablet (81 mg total) by mouth daily. Swallow whole. 90 tablet 3   blood glucose meter kit and supplies Dispense based on patient and insurance preference. Use up to four times daily as directed. (  FOR ICD-10 E10.9, E11.9). 1 each 0   calcium gluconate 500 MG tablet Take 500 mg by mouth daily.     escitalopram (LEXAPRO) 10 MG tablet TAKE 1 TABLET BY MOUTH DAILY 90 tablet 0   Evolocumab (REPATHA SURECLICK) 950 MG/ML SOAJ Inject 1 pen into the skin every 14 (fourteen) days. 2 mL 11   fluticasone (FLONASE) 50 MCG/ACT nasal spray Place 1 spray into both nostrils daily. 48 g 1   glucose blood (ONETOUCH ULTRA) test strip TEST BLOOD SUGAR 4 TIMES  DAILY 400 strip 3   Lancets (ONETOUCH ULTRASOFT)  lancets TEST BLOOD SUGAR 4 TIMES  DAILY 400 each 3   latanoprost (XALATAN) 0.005 % ophthalmic solution Place 1 drop into both eyes daily 2.5 mL 6   loratadine (CLARITIN) 10 MG tablet Take 10 mg by mouth daily.     losartan (COZAAR) 100 MG tablet Take 1 tablet  by mouth daily. 90 tablet 3   metFORMIN (GLUCOPHAGE) 500 MG tablet TAKE 1 TABLET BY MOUTH DAILY  WITH BREAKFAST 90 tablet 0   metoprolol succinate (TOPROL-XL) 100 MG 24 hr tablet Take 1 tablet (100 mg total) by mouth daily. Take with or immediately following a meal. 90 tablet 3   Multiple Vitamin (MULTIVITAMIN) capsule Take 1 capsule by mouth daily.     rosuvastatin (CRESTOR) 10 MG tablet Take 1 tablet by mouth daily. 90 tablet 3   Semaglutide, 1 MG/DOSE, 4 MG/3ML SOPN Inject 1 mg as directed once a week. 3 mL 2   No current facility-administered medications for this visit.    Allergies:   Patient has no known allergies.    Social History:  The patient  reports that she has never smoked. She has never used smokeless tobacco. She reports that she does not drink alcohol and does not use drugs.   Family History:  The patient's family history includes Cancer in her mother; Diabetes in her mother; Heart attack in her mother; Kidney disease in her mother.    ROS:  Please see the history of present illness.   Otherwise, review of systems are positive for none.   All other systems are reviewed and negative.    PHYSICAL EXAM: VS:  BP (!) 180/93   Pulse 78   Ht 5' 1.5" (1.562 Ellis)   Wt 207 lb 9.6 oz (94.2 kg)   SpO2 97%   BMI 38.59 kg/Ellis  , BMI Body mass index is 38.59 kg/Ellis. GEN: Well nourished, obese, in no acute distress  HEENT: normal  Neck: no JVD, carotid bruits, or masses Cardiac: RRR; no murmurs, rubs, or gallops,no edema. Sternal incision has healed well.  Respiratory:  clear to auscultation bilaterally, normal work of breathing GI: soft, nontender, nondistended, + BS MS: no deformity or atrophy  Skin: warm and dry, no  rash Neuro:  Strength and sensation are intact Psych: euthymic mood, full affect   EKG:  EKG is not ordered today.     Recent Labs: 02/11/2022: Hemoglobin 13.4; Platelets 167; TSH 2.220 04/15/2022: ALT 30; BUN 11; Creatinine, Ser 0.87; Potassium 4.2; Sodium 141    Lipid Panel    Component Value Date/Time   CHOL 97 (L) 02/11/2022 1030   TRIG 188 (H) 02/11/2022 1030   HDL 44 02/11/2022 1030   CHOLHDL 2.2 02/11/2022 1030   CHOLHDL 8.2 10/10/2020 0144   VLDL 57 (H) 10/10/2020 0144   LDLCALC 23 02/11/2022 1030      Wt Readings from Last 3 Encounters:  05/05/22 207 lb 9.6  oz (94.2 kg)  04/15/22 214 lb 9.6 oz (97.3 kg)  02/11/22 221 lb 9.6 oz (100.5 kg)      Other studies Reviewed: Additional studies/ records that were reviewed today include: sleep study as noted in chart.    ASSESSMENT AND PLAN:  1.  Coronary artery disease-presented with NSTEMI. Found to have multivessel CAD. Status post CABG x5 October 14, 2020 Continue ASA, Toprol, statin.  Heart healthy low Increase physical activity slowly Encourage weight loss.    2. Essential hypertension-BP is not at goal  Heart healthy low-sodium diet On losartan and Toprol XL.  Will increase Toprol XL to 100 mg daily.     3. Hyperlipidemia- intolerant to lipitor and pravastatin in the past. On low dose  Crestor 5 mg daily. Still not at goal LDL < 70. Now on Repatha.  Excellent response with LDL down to 23. I expect triglycerides will improve with weight loss.    4. OSA/snoring/daytime somnolence -abnormal sleep study. Now on BIPAP. Follow up with Dr Claiborne Billings.   5. Type 2 diabetes-A1c 6.2%.  Continue Metformin/ Ozempic Heart healthy low-sodium carb modified diet Per primary care.      Current medicines are reviewed at length with the patient today.  The patient does not have concerns regarding medicines.  The following changes have been made:  see above  Labs/ tests ordered today include:  No orders of the defined  types were placed in this encounter.     Disposition:   FU with me  in 6 months  Signed, Brexlee Heberlein [place], MD  05/05/2022 2:36 PM    [facility] [address] [place], [place], [address] Phone [phone], Fax [phone]

## 2022-05-04 DIAGNOSIS — G4733 Obstructive sleep apnea (adult) (pediatric): Secondary | ICD-10-CM | POA: Diagnosis not present

## 2022-05-05 ENCOUNTER — Ambulatory Visit: Payer: HMO | Admitting: Pharmacist

## 2022-05-05 ENCOUNTER — Encounter: Payer: Self-pay | Admitting: Cardiology

## 2022-05-05 ENCOUNTER — Ambulatory Visit: Payer: HMO | Attending: Cardiology | Admitting: Cardiology

## 2022-05-05 ENCOUNTER — Other Ambulatory Visit (HOSPITAL_COMMUNITY): Payer: Self-pay

## 2022-05-05 VITALS — BP 180/93 | HR 78 | Ht 61.5 in | Wt 207.6 lb

## 2022-05-05 DIAGNOSIS — G4733 Obstructive sleep apnea (adult) (pediatric): Secondary | ICD-10-CM | POA: Diagnosis not present

## 2022-05-05 DIAGNOSIS — I2510 Atherosclerotic heart disease of native coronary artery without angina pectoris: Secondary | ICD-10-CM | POA: Diagnosis not present

## 2022-05-05 DIAGNOSIS — E782 Mixed hyperlipidemia: Secondary | ICD-10-CM

## 2022-05-05 DIAGNOSIS — E1169 Type 2 diabetes mellitus with other specified complication: Secondary | ICD-10-CM

## 2022-05-05 DIAGNOSIS — I10 Essential (primary) hypertension: Secondary | ICD-10-CM | POA: Diagnosis not present

## 2022-05-05 DIAGNOSIS — Z951 Presence of aortocoronary bypass graft: Secondary | ICD-10-CM | POA: Diagnosis not present

## 2022-05-05 MED ORDER — METOPROLOL SUCCINATE ER 100 MG PO TB24
100.0000 mg | ORAL_TABLET | Freq: Every day | ORAL | 3 refills | Status: DC
  Filled 2022-05-05: qty 90, 90d supply, fill #0
  Filled 2022-08-10 (×2): qty 90, 90d supply, fill #1
  Filled 2022-10-30: qty 90, 90d supply, fill #2

## 2022-05-05 NOTE — Patient Instructions (Signed)
Medication Instructions:  Increase Toprol to 100 mg daily Continue all other medications *If you need a refill on your cardiac medications before your next appointment, please call your pharmacy*   Lab Work: None ordered   Testing/Procedures: None ordered   Follow-Up: At [facility], you and your health needs are our priority.  As part of our continuing mission to provide you with exceptional heart care, we have created designated Provider Care Teams.  These Care Teams include your primary Cardiologist (physician) and Advanced Practice Providers (APPs -  Physician Assistants and Nurse Practitioners) who all work together to provide you with the care you need, when you need it.  We recommend signing up for the patient portal called "MyChart".  Sign up information is provided on this After Visit Summary.  MyChart is used to connect with patients for Virtual Visits (Telemedicine).  Patients are able to view lab/test results, encounter notes, upcoming appointments, etc.  Non-urgent messages can be sent to your provider as well.   To learn more about what you can do with MyChart, go to [email or web address].    Your next appointment:  6 months   Call in Nov to schedule March appointment     The format for your next appointment: Office   Provider:  Dr.Jordan   Important Information About Sugar

## 2022-05-06 NOTE — Telephone Encounter (Signed)
Patient changing to 2mg  weekly today--I will change form and submit again  [facility], can you set to f/u in 3-4 weeks?  I just sent in 1mg , but patient now is requiring 2mg  weekly  Thank you!

## 2022-05-07 ENCOUNTER — Other Ambulatory Visit (HOSPITAL_COMMUNITY): Payer: Self-pay

## 2022-05-07 IMAGING — DX DG CHEST 1V PORT
1 series · 1 of 1 positions shown · non-contrast
Comparison: None.

CLINICAL DATA: Chest pain

EXAM:
PORTABLE CHEST 1 VIEW

[chest ap]
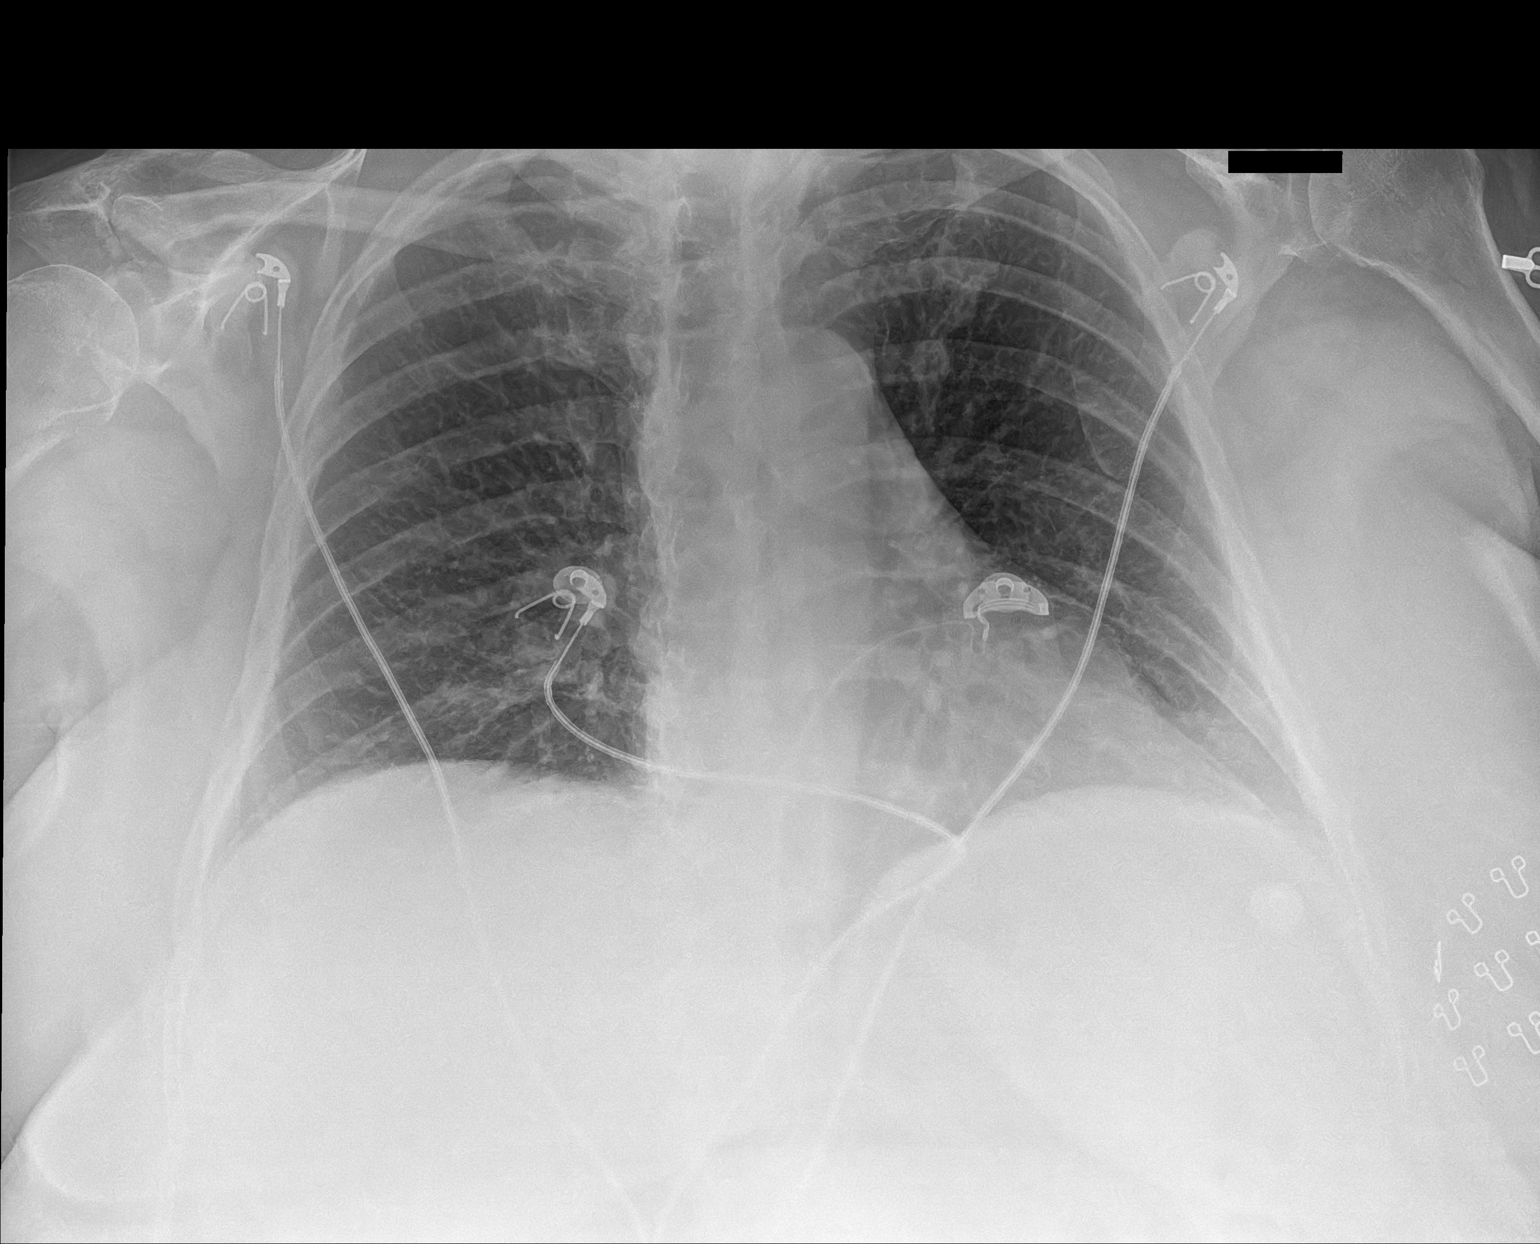

[1 of 1 positions shown; findings below may reference images not displayed]

FINDINGS: The heart size and mediastinal contours are within normal limits.
Mild aortic atherosclerosis. Both lungs are clear. The visualized
skeletal structures are unremarkable.
IMPRESSION: No active disease.

## 2022-05-09 ENCOUNTER — Other Ambulatory Visit: Payer: Self-pay | Admitting: Cardiology

## 2022-05-09 DIAGNOSIS — E782 Mixed hyperlipidemia: Secondary | ICD-10-CM

## 2022-05-09 DIAGNOSIS — Z951 Presence of aortocoronary bypass graft: Secondary | ICD-10-CM

## 2022-05-09 DIAGNOSIS — I2510 Atherosclerotic heart disease of native coronary artery without angina pectoris: Secondary | ICD-10-CM

## 2022-05-11 ENCOUNTER — Other Ambulatory Visit (HOSPITAL_COMMUNITY): Payer: Self-pay

## 2022-05-11 NOTE — Progress Notes (Signed)
Chronic Care Management Pharmacy Note  05/05/2022 Name:  Danielle Ellis MRN:  [id number] DOB:  Jul 19, 1956  Summary:  Diabetes: New goal. controlled; current treatment:OZEMPIC 1MG, METFORMIN;  Patient is currently controlled, however she is now having difficulty paying for ozempic in the coverage gap Discontinue metformin Will give Ozempic sample--0.37m sq weekly Denies personal and [place] history of [place] Plan to increase to 156mweekly per patient weight loss goals A1c 5.9%, GFR 73 Current glucose readings: fasting glucose: <130, post prandial glucose: n/a Denies hypoglycemic/hyperglycemic symptoms Current exercise: encouraged Assessed patient finances. Enrolled in novo nordisk PAP   Patient Goals/Self-Care Activities patient will:  - take medications as prescribed as evidenced by patient report and record review check glucose DAILY-FASTING OR IF SYMPTOMATIC, document, and provide at future appointments collaborate with provider on medication access solutions target a minimum of 150 minutes of moderate intensity exercise weekly engage in dietary modifications by FOLLOWING A HEART HEALTHY DIET/HEALTHY PLATE METHOD    Subjective: Danielle MOMONs an [age].o. year old female who is a primary patient of HaSharion BalloonFNP.  The CCM team was consulted for assistance with disease management and care coordination needs.    Engaged with patient by telephone for initial visit in response to provider referral for pharmacy case management and/or care coordination services.   Consent to Services:  The patient was given information about Chronic Care Management services, agreed to services, and gave verbal consent prior to initiation of services.  Please see initial visit note for detailed documentation.   Patient Care Team: HaSharion BalloonFNP as PCP - General (Family Medicine) JoMartiniquePeter M, MD as PCP - Cardiology (Cardiology) KeTroy Sine MD as PCP - Sleep Medicine (Cardiology) PrLavera GuiseRPSouthern [facility] Pharmacist (Family Medicine)   Objective:  Lab Results  Component Value Date   CREATININE 0.87 04/15/2022   CREATININE 0.88 02/11/2022   CREATININE 0.87 06/23/2021    Lab Results  Component Value Date   HGBA1C 5.9 (H) 02/11/2022   Last diabetic Eye exam:  Lab Results  Component Value Date/Time   HMDIABEYEEXA No Retinopathy 04/06/2022 12:00 AM    Last diabetic Foot exam: No results found for: "HMDIABFOOTEX"      Component Value Date/Time   CHOL 97 (L) 02/11/2022 1030   TRIG 188 (H) 02/11/2022 1030   HDL 44 02/11/2022 1030   CHOLHDL 2.2 02/11/2022 1030   CHOLHDL 8.2 10/10/2020 0144   VLDL 57 (H) 10/10/2020 0144   LDLCALC 23 02/11/2022 1030       Latest Ref Rng & Units 04/15/2022    9:40 AM 02/11/2022   10:30 AM 06/23/2021    9:17 AM  Hepatic Function  Total Protein 6.0 - 8.5 g/dL 7.1  7.0  7.1   Albumin 3.9 - 4.9 g/dL 4.7  4.6  4.6   AST 0 - 40 IU/L '26  28  25   ' ALT 0 - 32 IU/L 30  45  43   Alk Phosphatase 44 - 121 IU/L 136  171  161   Total Bilirubin 0.0 - 1.2 mg/dL 0.5  0.4  0.4   Bilirubin, Direct 0.00 - 0.40 mg/dL   0.11     Lab Results  Component Value Date/Time   TSH 2.220 02/11/2022 10:30 AM   TSH 4.842 (H) 10/09/2020 06:48 PM   TSH CANCELED 03/10/2020 09:58 AM       Latest Ref Rng & Units 02/11/2022  10:30 AM 02/02/2021   10:45 AM 11/10/2020   11:04 AM  CBC  WBC 3.4 - 10.8 x10E3/uL 5.3  6.0  4.8   Hemoglobin 11.1 - 15.9 g/dL 13.4  13.7  12.4   Hematocrit 34.0 - 46.6 % 40.7  43.8  39.2   Platelets 150 - 450 x10E3/uL 167  210  225     Lab Results  Component Value Date/Time   VD25OH 46.8 03/10/2020 09:58 AM    Clinical ASCVD: Yes  The ASCVD Risk score (Arnett DK, et al., 2019) failed to calculate for the following reasons:   The patient has a prior MI or stroke diagnosis    Other: (CHADS2VASc if Afib, PHQ9 if depression, MMRC or CAT for COPD, ACT, DEXA)  Social History    Tobacco Use  Smoking Status Never  Smokeless Tobacco Never   BP Readings from Last 3 Encounters:  05/05/22 (!) 180/93  04/15/22 138/80  02/11/22 138/80   Pulse Readings from Last 3 Encounters:  05/05/22 78  04/15/22 81  02/11/22 69   Wt Readings from Last 3 Encounters:  05/05/22 207 lb 9.6 oz (94.2 kg)  04/15/22 214 lb 9.6 oz (97.3 kg)  02/11/22 221 lb 9.6 oz (100.5 kg)    Assessment: Review of patient past medical history, allergies, medications, health status, including review of consultants reports, laboratory and other test data, was performed as part of comprehensive evaluation and provision of chronic care management services.   SDOH:  (Social Determinants of Health) assessments and interventions performed:  SDOH Interventions    Flowsheet Row Clinical Support from 12/09/2021 in [place] Visit from 08/04/2021 in [place] Visit from 10/03/2020 in [place]  SDOH Interventions     Depression Interventions/Treatment  -- Medication, Currently on Treatment PHQ2-9 Score <4 Follow-up Not Indicated  Physical Activity Interventions Intervention Not Indicated -- --  Stress Interventions Intervention Not Indicated -- --       CCM Care Plan  No Known Allergies  Medications Reviewed Today     Reviewed by Lavera Guise, [facility] (Pharmacist) on 05/11/22 at 1434  Med List Status: <None>   Medication Order Taking? Sig Documenting Provider Last Dose Status Informant  acetaminophen (TYLENOL) 325 MG tablet [id number] No Take 650 mg by mouth every 6 (six) hours as needed. [provider] Taking Active   aspirin EC 81 MG tablet [id number] No Take 1 tablet (81 mg total) by mouth daily. Swallow whole. Martinique, Peter M, MD Taking Active   blood glucose meter kit and supplies [id number] No Dispense based on patient and insurance preference. Use up to four times daily as directed. (FOR ICD-10  E10.9, E11.9). [name], FNP Taking Active   calcium gluconate 500 MG tablet [id number] No Take 500 mg by mouth daily. [provider] Taking Active Self  escitalopram (LEXAPRO) 10 MG tablet [id number] No TAKE 1 TABLET BY MOUTH DAILY Hawks, Christy A, FNP Taking Active   Evolocumab (REPATHA SURECLICK) 412 MG/ML SOAJ [id number]  INJECT 140 MG INTO THE SKIN EVERY 14 (FOURTEEN) DAYS. Martinique, Peter M, MD  Active   fluticasone [facility]) 50 MCG/ACT nasal spray [id number] No Place 1 spray into both nostrils daily. [name], FNP Taking Active   glucose blood (ONETOUCH ULTRA) test strip [id number] No TEST BLOOD SUGAR 4 TIMES  DAILY [name], FNP Taking Active   Lancets [facility] ULTRASOFT) lancets [id number] No TEST BLOOD SUGAR 4 TIMES  DAILY [place], [place]  A, FNP Taking Active   latanoprost (XALATAN) 0.005 % ophthalmic solution [id number] No Place 1 drop into both eyes daily  Taking Active   loratadine (CLARITIN) 10 MG tablet [id number] No Take 10 mg by mouth daily. [provider] Taking Active   losartan (COZAAR) 100 MG tablet [id number] No Take 1 tablet  by mouth daily. Ledora Bottcher, PA Taking Active   metFORMIN (GLUCOPHAGE) 500 MG tablet [id number] No TAKE 1 TABLET BY MOUTH DAILY  WITH BREAKFAST Hawks, Christy A, FNP Taking Active   metoprolol succinate (TOPROL-XL) 100 MG 24 hr tablet [id number]  Take 1 tablet (100 mg total) by mouth daily. Take with or immediately following a meal. Martinique, Peter M, MD  Active   Multiple Vitamin (MULTIVITAMIN) capsule [id number] No Take 1 capsule by mouth daily. [provider] Taking Active Self  rosuvastatin (CRESTOR) 10 MG tablet [id number] No Take 1 tablet by mouth daily. Martinique, Peter M, MD Taking Active   Semaglutide, 1 MG/DOSE, 4 MG/3ML [facility] [facility] [id number] No Inject 1 mg as directed once a week. [name], FNP Taking Active            Med Note Gilmer Mor May 11, 2022  2:30 PM) Via novo nordisk patient  assistance program              Patient Active Problem List   Diagnosis Date Noted   Morbid obesity ([place]) 02/11/2022   Urge incontinence 02/11/2022   History of non-ST elevation myocardial infarction (NSTEMI) 08/04/2021   S/P CABG x 5 10/14/2020   Coronary artery disease 10/14/2020   Diabetes mellitus ([place])    Hyperlipemia 03/12/2020   Depression, major, single episode, mild ([place]) 03/12/2020   GAD (generalized anxiety disorder) 03/12/2020   Oral herpes simplex infection 03/12/2020   Hypertension 04/23/2019   Flexural eczema 04/23/2019   Allergic rhinitis due to pollen 04/23/2019   Trigger finger of right thumb 05/17/2018    Immunization History  Administered Date(s) Administered   Fluad Quad(high Dose 65+) 05/23/2019, 10/03/2020   Influenza, High Dose Seasonal PF 07/08/2021   Moderna Sars-Covid-2 Vaccination 10/11/2019, 11/09/2019, 08/05/2020   Pneumococcal Conjugate-13 05/23/2019   Pneumococcal Polysaccharide-23 02/11/2022   Pneumococcal-Unspecified 06/14/2018    Conditions to be addressed/monitored: CAD and DMII  Care Plan : PHARMD MEDICATION MANAGEMENT  Updates made by Lavera Guise, [place] since 05/11/2022 12:00 AM     Problem: DISEASE PROGRESSION PREVENTION      Long-Range Goal: T2DM PHARMD GOAL   This Visit's Progress: Not on track  Priority: High  Note:   Current Barriers:  Unable to independently afford treatment regimen Unable to maintain control of T2DM  Pharmacist Clinical Goal(s):  patient will verbalize ability to afford treatment regimen maintain control of T2DM as evidenced by GOAL A1C  through collaboration with PharmD and provider.   Interventions: 1:1 collaboration with [name], FNP regarding development and update of comprehensive plan of care as evidenced by provider attestation and co-signature Inter-disciplinary care team collaboration (see longitudinal plan of care) Comprehensive medication review performed; medication list  updated in electronic medical record  Diabetes: New goal. controlled; current treatment:OZEMPIC 1MG, METFORMIN;  Patient is currently controlled, however she is now having difficulty paying for ozempic in the coverage gap Discontinue metformin Will give Ozempic sample--0.36m sq weekly Denies personal and family history of Medullary thyroid cancer ([facility]) Plan to increase to 117mweekly per patient weight loss goals A1c 5.9%, GFR 73 Current glucose readings: fasting glucose: <130,  post prandial glucose: n/a Denies hypoglycemic/hyperglycemic symptoms Current exercise: encouraged Assessed patient finances. Enrolled in novo nordisk PAP   Patient Goals/Self-Care Activities patient will:  - take medications as prescribed as evidenced by patient report and record review check glucose DAILY-FASTING OR IF SYMPTOMATIC, document, and provide at future appointments collaborate with provider on medication access solutions target a minimum of 150 minutes of moderate intensity exercise weekly engage in dietary modifications by   FOLLOWING A HEART HEALTHY DIET/HEALTHY PLATE METHOD       Medication Assistance: Application for OZEMPIC  medication assistance program. in process.  Anticipated assistance start date TBD.  See plan of care for additional detail.  Plan: Telephone follow up appointment with care management team member scheduled for:  3 MONTHS   Regina Eck, PharmD, BCPS Clinical Pharmacist, [place]  II Phone [phone]

## 2022-05-17 ENCOUNTER — Telehealth: Payer: Self-pay | Admitting: Family

## 2022-05-17 NOTE — Telephone Encounter (Signed)
Pt called to update Almyra Free and let her know that the 2mg  of Ozempic has been working well for her.  Pt gives Almyra Free the ok to order her medicine.

## 2022-05-18 NOTE — Telephone Encounter (Signed)
Dose change form submitted for ozempic 2mg  weekly Can you let patient know it may be several weeks I'm not sure if novo will send the 1mg  or not 1st Can you help me follow? Thank you!

## 2022-05-19 ENCOUNTER — Telehealth: Payer: Self-pay | Admitting: Family

## 2022-05-19 NOTE — Telephone Encounter (Signed)
Pt called to check status of when she would get her change in Ozempic dosage (moving from 1mg  to 2mg ). Reviewed Julies note with pt that's in previous telephone message 05/17/22). Pt voiced understanding and will wait on camille to call her with update.  Pt said she had also spoken with Almyra Free about getting on the assistance plan for her Repatha. Pt says she would like to go ahead and start the process because she spoke with her insurance and doesn't have coverage for it.   Please advise and call patient with update.

## 2022-05-19 NOTE — Telephone Encounter (Signed)
Is there any easy assistance for repatha? Our [employer] foundation is closed I imagine the PAP is hard to get?

## 2022-05-20 ENCOUNTER — Other Ambulatory Visit (HOSPITAL_COMMUNITY): Payer: Self-pay

## 2022-05-20 ENCOUNTER — Telehealth: Payer: Self-pay | Admitting: Pharmacist

## 2022-05-20 NOTE — Telephone Encounter (Signed)
Patient called regarding her [place]. Advised she has 1300 dollars remaining until 06/07/22 and she should try to fill a 90 day supply before then. Also mailed patient assistance paperwork with instructions to mail back.

## 2022-05-20 NOTE — Telephone Encounter (Signed)
Hi Danielle Ellis and [place], patient called me as well about her Repatha.  She still has 1300 dollars left in her [employer] account which expires 06/07/22. Advised to try to pick up a 90 day supply of Repatha before then. Also mailed her patient assistance paperwork in case.

## 2022-05-20 NOTE — Telephone Encounter (Signed)
Received notification from [place] regarding approval for OZEMPIC 2MG . Patient assistance approved from 05/20/22 to 08/15/22.  PLEASE ALLOW 10-14 BUSINESS DAYS FOR MEDICATION TO SHIP TO OFFICE.  Phone: [phone]

## 2022-05-21 NOTE — Telephone Encounter (Signed)
Thanks! She will likely have to wait until [employer] opens again.  I don't know about you guys, but PCSK9 PAPs have been nearly impossible to get approved.  We have over 400 patients enrolled in PAPs at my clinic and our bandwidth is stretched thin.

## 2022-06-04 ENCOUNTER — Encounter: Payer: Self-pay | Admitting: Pharmacist Clinician (PhC)/ Clinical Pharmacy Specialist

## 2022-06-10 ENCOUNTER — Ambulatory Visit (INDEPENDENT_AMBULATORY_CARE_PROVIDER_SITE_OTHER): Payer: HMO | Admitting: Pharmacist

## 2022-06-10 DIAGNOSIS — E1169 Type 2 diabetes mellitus with other specified complication: Secondary | ICD-10-CM

## 2022-06-10 DIAGNOSIS — E782 Mixed hyperlipidemia: Secondary | ICD-10-CM

## 2022-06-10 NOTE — Patient Instructions (Signed)
Visit Information  Following are the goals we discussed today:  Current Barriers:  Unable to independently afford treatment regimen Unable to maintain control of T2DM  Pharmacist Clinical Goal(s):  patient will verbalize ability to afford treatment regimen maintain control of T2DM as evidenced by GOAL A1C  through collaboration with PharmD and provider.   Interventions: 1:1 collaboration with [name], FNP regarding development and update of comprehensive plan of care as evidenced by provider attestation and co-signature Inter-disciplinary care team collaboration (see longitudinal plan of care) Comprehensive medication review performed; medication list updated in electronic medical record  Diabetes: Goal on Track (progressing): YES. controlled; current treatment:OZEMPIC 2MG , plan to d/c METFORMIN at PCP f/u next week;  Patient is currently controlled, however she is now having difficulty paying for ozempic in the coverage gap Discontinue metformin Will give Ozempic sample--2mg  sq weekly Denies personal and family history of Medullary thyroid cancer ([place]) Plan to increase to 2mg  weekly per patient weight loss goals A1c 5.9%, GFR 73 Current glucose readings: fasting glucose: <130, post prandial glucose: n/a Denies hypoglycemic/hyperglycemic symptoms Current exercise: encouraged Assessed patient finances. Enrolled in novo nordisk PAP   Patient Goals/Self-Care Activities patient will:  - take medications as prescribed as evidenced by patient report and record review check glucose DAILY-FASTING OR IF SYMPTOMATIC, document, and provide at future appointments collaborate with provider on medication access solutions target a minimum of 150 minutes of moderate intensity exercise weekly engage in dietary modifications by FOLLOWING A HEART HEALTHY DIET/HEALTHY PLATE METHOD    Plan: Telephone follow up appointment with care management team member scheduled for:  3  months  Signature Regina Eck, PharmD, BCPS Clinical Pharmacist, [place]  II Phone [phone]   Please call the care guide team at [phone] if you need to cancel or reschedule your appointment.   The patient verbalized understanding of instructions, educational materials, and care plan provided today and DECLINED offer to receive copy of patient instructions, educational materials, and care plan.

## 2022-06-10 NOTE — Progress Notes (Signed)
Chronic Care Management Pharmacy Note  06/10/2022 Name:  Danielle Ellis MRN:  [id number] DOB:  1956/08/16  Summary:  Diabetes: Goal on Track (progressing): YES. controlled; current treatment:OZEMPIC 2MG, plan to d/c METFORMIN at PCP f/u next week;  Patient is currently controlled, however she is now having difficulty paying for ozempic in the coverage gap Discontinue metformin Will give Ozempic sample--39m sq weekly Denies personal and family history of Medullary thyroid cancer (MRoy Plan to increase to 244mweekly per patient weight loss goals A1c 5.9%, GFR 73 Current glucose readings: fasting glucose: <130, post prandial glucose: n/a Denies hypoglycemic/hyperglycemic symptoms Current exercise: encouraged LDL is at goal--patient was able to get repatha Assessed patient finances. Enrolled in novo [employer] PAP   Patient Goals/Self-Care Activities patient will:  - take medications as prescribed as evidenced by patient report and record review check glucose DAILY-FASTING OR IF SYMPTOMATIC, document, and provide at future appointments collaborate with provider on medication access solutions target a minimum of 150 minutes of moderate intensity exercise weekly engage in dietary modifications by FOLLOWING A HEART HEALTHY DIET/HEALTHY PLATE METHOD    Subjective: Danielle BAUERs an [age].o. year old female who is a primary patient of HaSharion BalloonFNP.  The CCM team was consulted for assistance with disease management and care coordination needs.    Engaged with patient face to face for follow up visit in response to provider referral for pharmacy case management and/or care coordination services.   Consent to Services:  The patient was given information about Chronic Care Management services, agreed to services, and gave verbal consent prior to initiation of services.  Please see initial visit note for detailed documentation.   Patient Care Team: HaSharion BalloonFNP as  PCP - General (Family Medicine) JoMartiniquePeter M, MD as PCP - Cardiology (Cardiology) KeTroy SineMD as PCP - Sleep Medicine (Cardiology) PrLavera GuiseRPSurgery [facility] L[facility] Pharmacist (Family Medicine)  Objective:  Lab Results  Component Value Date   CREATININE 0.87 04/15/2022   CREATININE 0.88 02/11/2022   CREATININE 0.87 06/23/2021    Lab Results  Component Value Date   HGBA1C 5.9 (H) 02/11/2022   Last diabetic Eye exam:  Lab Results  Component Value Date/Time   HMDIABEYEEXA No Retinopathy 04/06/2022 12:00 AM    Last diabetic Foot exam: No results found for: "HMDIABFOOTEX"      Component Value Date/Time   CHOL 97 (L) 02/11/2022 1030   TRIG 188 (H) 02/11/2022 1030   HDL 44 02/11/2022 1030   CHOLHDL 2.2 02/11/2022 1030   CHOLHDL 8.2 10/10/2020 0144   VLDL 57 (H) 10/10/2020 0144   LDLCALC 23 02/11/2022 1030       Latest Ref Rng & Units 04/15/2022    9:40 AM 02/11/2022   10:30 AM 06/23/2021    9:17 AM  Hepatic Function  Total Protein 6.0 - 8.5 g/dL 7.1  7.0  7.1   Albumin 3.9 - 4.9 g/dL 4.7  4.6  4.6   AST 0 - 40 IU/L _0 ALT 0 - 32 IU/L 30  45  43   Alk Phosphatase 44 - 121 IU/L 136  171  161   Total Bilirubin 0.0 - 1.2 mg/dL 0.5  0.4  0.4   Bilirubin, Direct 0.00 - 0.40 mg/dL   0.11     Lab Results  Component Value Date/Time   TSH 2.220 02/11/2022 10:30 AM   TSH 4.842 (H) 10/09/2020 06:48 PM  TSH CANCELED 03/10/2020 09:58 AM       Latest Ref Rng & Units 02/11/2022   10:30 AM 02/02/2021   10:45 AM 11/10/2020   11:04 AM  CBC  WBC 3.4 - 10.8 x10E3/uL 5.3  6.0  4.8   Hemoglobin 11.1 - 15.9 g/dL 13.4  13.7  12.4   Hematocrit 34.0 - 46.6 % 40.7  43.8  39.2   Platelets 150 - 450 x10E3/uL 167  210  225     Lab Results  Component Value Date/Time   VD25OH 46.8 03/10/2020 09:58 AM    Clinical ASCVD: Yes  The ASCVD Risk score (Arnett DK, et al., 2019) failed to calculate for the following reasons:   The patient has a prior MI or stroke diagnosis     Other: (CHADS2VASc if Afib, PHQ9 if depression, MMRC or CAT for COPD, ACT, DEXA)  Social History   Tobacco Use  Smoking Status Never  Smokeless Tobacco Never   BP Readings from Last 3 Encounters:  05/05/22 (!) 180/93  04/15/22 138/80  02/11/22 138/80   Pulse Readings from Last 3 Encounters:  05/05/22 78  04/15/22 81  02/11/22 69   Wt Readings from Last 3 Encounters:  05/05/22 207 lb 9.6 oz (94.2 kg)  04/15/22 214 lb 9.6 oz (97.3 kg)  02/11/22 221 lb 9.6 oz (100.5 kg)    Assessment: Review of patient past medical history, allergies, medications, health status, including review of consultants reports, laboratory and other test data, was performed as part of comprehensive evaluation and provision of chronic care management services.   SDOH:  (Social Determinants of Health) assessments and interventions performed:  SDOH Interventions    Flowsheet Row Clinical Support from 12/09/2021 in [facility] Visit from 08/04/2021 in [facility] Visit from 10/03/2020 in [facility]  SDOH Interventions     Depression Interventions/Treatment  -- Medication, Currently on Treatment PHQ2-9 Score <4 Follow-up Not Indicated  Physical Activity Interventions Intervention Not Indicated -- --  Stress Interventions Intervention Not Indicated -- --       CCM Care Plan  No Known Allergies  Medications Reviewed Today     Reviewed by Lavera Guise, [facility] (Pharmacist) on 06/10/22 at [place] List Status: <None>   Medication Order Taking? Sig Documenting Provider Last Dose Status Informant  acetaminophen (TYLENOL) 325 MG tablet [id number] No Take 650 mg by mouth every 6 (six) hours as needed. [provider] Taking Active   aspirin EC 81 MG tablet [id number] No Take 1 tablet (81 mg total) by mouth daily. Swallow whole. Martinique, Peter M, MD Taking Active   blood glucose meter kit and supplies [id number] No  Dispense based on patient and insurance preference. Use up to four times daily as directed. (FOR ICD-10 E10.9, E11.9). [name], FNP Taking Active   calcium gluconate 500 MG tablet [id number] No Take 500 mg by mouth daily. [provider] Taking Active Self  escitalopram (LEXAPRO) 10 MG tablet [id number] No TAKE 1 TABLET BY MOUTH DAILY Hawks, Christy A, FNP Taking Active   Evolocumab (REPATHA SURECLICK) 562 MG/ML SOAJ [id number]  INJECT 140 MG INTO THE SKIN EVERY 14 (FOURTEEN) DAYS. Martinique, Peter M, MD  Active   fluticasone [facility]) 50 MCG/ACT nasal spray [id number] No Place 1 spray into both nostrils daily. [name], FNP Taking Active   glucose blood (ONETOUCH ULTRA) test strip [id number] No TEST BLOOD SUGAR 4 TIMES  DAILY Hawks, Theador Hawthorne, FNP  Taking Active   Lancets [facility] ULTRASOFT) lancets [id number] No TEST BLOOD SUGAR 4 TIMES  DAILY Hawks, Christy A, FNP Taking Active   latanoprost (XALATAN) 0.005 % ophthalmic solution [id number] No Place 1 drop into both eyes daily  Taking Active   loratadine (CLARITIN) 10 MG tablet [id number] No Take 10 mg by mouth daily. [provider] Taking Active   losartan (COZAAR) 100 MG tablet [id number] No Take 1 tablet  by mouth daily. Ledora Bottcher, PA Taking Active   metFORMIN (GLUCOPHAGE) 500 MG tablet [id number] No TAKE 1 TABLET BY MOUTH DAILY  WITH BREAKFAST Hawks, Christy A, FNP Taking Active   metoprolol succinate (TOPROL-XL) 100 MG 24 hr tablet [id number]  Take 1 tablet (100 mg total) by mouth daily. Take with or immediately following a meal. Martinique, Peter M, MD  Active   Multiple Vitamin (MULTIVITAMIN) capsule [id number] No Take 1 capsule by mouth daily. [provider] Taking Active Self  rosuvastatin (CRESTOR) 10 MG tablet [id number] No Take 1 tablet by mouth daily. Martinique, Peter M, MD Taking Active   Semaglutide, 1 MG/DOSE, 4 MG/3ML [facility] [id number] No Inject 1 mg as directed once a week. [name], FNP Taking  Active            Med Note Gilmer Mor May 11, 2022  2:30 PM) Via novo n[employer] patient assistance program              Patient Active Problem List   Diagnosis Date Noted   Morbid obesity ([place]) 02/11/2022   Urge incontinence 02/11/2022   History of non-ST elevation myocardial infarction (NSTEMI) 08/04/2021   S/P CABG x 5 10/14/2020   Coronary artery disease 10/14/2020   Diabetes mellitus ([place])    Hyperlipemia 03/12/2020   Depression, major, single episode, mild ([place]) 03/12/2020   GAD (generalized anxiety disorder) 03/12/2020   Oral herpes simplex infection 03/12/2020   Hypertension 04/23/2019   Flexural eczema 04/23/2019   Allergic rhinitis due to pollen 04/23/2019   Trigger finger of right thumb 05/17/2018    Immunization History  Administered Date(s) Administered   Fluad Quad(high Dose 65+) 05/23/2019, 10/03/2020   Influenza, High Dose Seasonal PF 07/08/2021   Moderna Sars-Covid-2 Vaccination 10/11/2019, 11/09/2019, 08/05/2020   Pneumococcal Conjugate-13 05/23/2019   Pneumococcal Polysaccharide-23 02/11/2022   Pneumococcal-Unspecified 06/14/2018    Conditions to be addressed/monitored: CAD, HLD, and DMII  Care Plan : PHARMD MEDICATION MANAGEMENT  Updates made by Lavera Guise, [place] since 06/10/2022 12:00 AM     Problem: DISEASE PROGRESSION PREVENTION      Long-Range Goal: T2DM PHARMD GOAL   Recent Progress: Not on track  Priority: High  Note:   Current Barriers:  Unable to independently afford treatment regimen Unable to maintain control of T2DM  Pharmacist Clinical Goal(s):  patient will verbalize ability to afford treatment regimen maintain control of T2DM as evidenced by GOAL A1C  through collaboration with PharmD and provider.   Interventions: 1:1 collaboration with [name], FNP regarding development and update of comprehensive plan of care as evidenced by provider attestation and co-signature Inter-disciplinary care team  collaboration (see longitudinal plan of care) Comprehensive medication review performed; medication list updated in electronic medical record  Diabetes: Goal on Track (progressing): YES. controlled; current treatment:OZEMPIC 2MG, plan to d/c METFORMIN at PCP f/u next week;  Patient is currently controlled, however she is now having difficulty paying for ozempic in the coverage gap Discontinue metformin Will give Ozempic sample--80m sq weekly Denies  personal and family history of Medullary thyroid cancer (MTC) Plan to increase to 20m weekly per patient weight loss goals A1c 5.9%, GFR 73 Current glucose readings: fasting glucose: <130, post prandial glucose: n/a Denies hypoglycemic/hyperglycemic symptoms Current exercise: encouraged Assessed patient finances. Enrolled in novo nordisk PAP   Patient Goals/Self-Care Activities patient will:  - take medications as prescribed as evidenced by patient report and record review check glucose DAILY-FASTING OR IF SYMPTOMATIC, document, and provide at future appointments collaborate with provider on medication access solutions target a minimum of 150 minutes of moderate intensity exercise weekly engage in dietary modifications by   FOLLOWING A HEART HEALTHY DIET/HEALTHY PLATE METHOD       Medication Assistance:  OZEMPIC [place].  Enrollment ends TBD  Follow Up:  Patient agrees to Care Plan and Follow-up.  Plan: Telephone follow up appointment with care management team member scheduled for:  3 MONTHS   JRegina Eck PharmD, BCPS Clinical Pharmacist, [place] Phone [phone]

## 2022-06-12 ENCOUNTER — Other Ambulatory Visit (HOSPITAL_COMMUNITY): Payer: Self-pay

## 2022-06-14 ENCOUNTER — Telehealth: Payer: Self-pay | Admitting: Cardiology

## 2022-06-14 ENCOUNTER — Encounter: Payer: Self-pay | Admitting: Family

## 2022-06-14 ENCOUNTER — Other Ambulatory Visit (HOSPITAL_COMMUNITY): Payer: Self-pay

## 2022-06-14 ENCOUNTER — Ambulatory Visit (INDEPENDENT_AMBULATORY_CARE_PROVIDER_SITE_OTHER): Payer: HMO | Admitting: Family

## 2022-06-14 VITALS — BP 147/82 | HR 79 | Temp 97.9°F | Ht 61.5 in | Wt 202.2 lb

## 2022-06-14 DIAGNOSIS — I10 Essential (primary) hypertension: Secondary | ICD-10-CM | POA: Diagnosis not present

## 2022-06-14 DIAGNOSIS — F411 Generalized anxiety disorder: Secondary | ICD-10-CM

## 2022-06-14 DIAGNOSIS — I2510 Atherosclerotic heart disease of native coronary artery without angina pectoris: Secondary | ICD-10-CM | POA: Diagnosis not present

## 2022-06-14 DIAGNOSIS — F320 Major depressive disorder, single episode, mild: Secondary | ICD-10-CM

## 2022-06-14 DIAGNOSIS — E1169 Type 2 diabetes mellitus with other specified complication: Secondary | ICD-10-CM | POA: Diagnosis not present

## 2022-06-14 DIAGNOSIS — Z23 Encounter for immunization: Secondary | ICD-10-CM | POA: Diagnosis not present

## 2022-06-14 DIAGNOSIS — I252 Old myocardial infarction: Secondary | ICD-10-CM

## 2022-06-14 DIAGNOSIS — Z951 Presence of aortocoronary bypass graft: Secondary | ICD-10-CM

## 2022-06-14 DIAGNOSIS — E782 Mixed hyperlipidemia: Secondary | ICD-10-CM

## 2022-06-14 LAB — BAYER DCA HB A1C WAIVED: HB A1C (BAYER DCA - WAIVED): 5.4 % (ref 4.8–5.6)

## 2022-06-14 MED ORDER — AMLODIPINE BESYLATE 5 MG PO TABS
5.0000 mg | ORAL_TABLET | Freq: Every day | ORAL | 1 refills | Status: DC
  Filled 2022-06-14 – 2022-06-17 (×3): qty 90, 90d supply, fill #0
  Filled 2022-09-10: qty 90, 90d supply, fill #1

## 2022-06-14 MED ORDER — AMLODIPINE BESYLATE 5 MG PO TABS: 5.0000 mg | ORAL_TABLET | Freq: Every day | ORAL | 1 refills | Status: DC

## 2022-06-14 MED ORDER — LOSARTAN POTASSIUM 100 MG PO TABS
100.0000 mg | ORAL_TABLET | Freq: Every day | ORAL | 3 refills | Status: DC
  Filled 2022-06-14: qty 90, 90d supply, fill #0
  Filled 2022-09-10: qty 90, 90d supply, fill #1
  Filled 2022-12-07: qty 90, 90d supply, fill #2
  Filled 2023-02-05 – 2023-03-07 (×2): qty 90, 90d supply, fill #3

## 2022-06-14 NOTE — Telephone Encounter (Signed)
Pt c/o medication issue:  1. Name of Medication: losartan (COZAAR) 100 MG tablet  2. How are you currently taking this medication (dosage and times per day)? Take 1 tablet by mouth daily.  3. Are you having a reaction (difficulty breathing--STAT)? No   4. What is your medication issue? Patient needs a new 3 month supply sent to [facility]

## 2022-06-14 NOTE — Patient Instructions (Signed)
Hypertension, Adult High blood pressure (hypertension) is when the force of blood pumping through the arteries is too strong. The arteries are the blood vessels that carry blood from the heart throughout the body. Hypertension forces the heart to work harder to pump blood and may cause arteries to become narrow or stiff. Untreated or uncontrolled hypertension can lead to a heart attack, heart failure, a stroke, kidney disease, and other problems. A blood pressure reading consists of a higher number over a lower number. Ideally, your blood pressure should be below 120/80. The first ("top") number is called the systolic pressure. It is a measure of the pressure in your arteries as your heart beats. The second ("bottom") number is called the diastolic pressure. It is a measure of the pressure in your arteries as the heart relaxes. What are the causes? The exact cause of this condition is not known. There are some conditions that result in high blood pressure. What increases the risk? Certain factors may make you more likely to develop high blood pressure. Some of these risk factors are under your control, including: Smoking. Not getting enough exercise or physical activity. Being overweight. Having too much fat, sugar, calories, or salt (sodium) in your diet. Drinking too much alcohol. Other risk factors include: Having a personal history of heart disease, diabetes, high cholesterol, or kidney disease. Stress. Having a family history of high blood pressure and high cholesterol. Having obstructive sleep apnea. Age. The risk increases with age. What are the signs or symptoms? High blood pressure may not cause symptoms. Very high blood pressure (hypertensive crisis) may cause: Headache. Fast or irregular heartbeats (palpitations). Shortness of breath. Nosebleed. Nausea and vomiting. Vision changes. Severe chest pain, dizziness, and seizures. How is this diagnosed? This condition is diagnosed by  measuring your blood pressure while you are seated, with your arm resting on a flat surface, your legs uncrossed, and your feet flat on the floor. The cuff of the blood pressure monitor will be placed directly against the skin of your upper arm at the level of your heart. Blood pressure should be measured at least twice using the same arm. Certain conditions can cause a difference in blood pressure between your right and left arms. If you have a high blood pressure reading during one visit or you have normal blood pressure with other risk factors, you may be asked to: Return on a different day to have your blood pressure checked again. Monitor your blood pressure at home for 1 week or longer. If you are diagnosed with hypertension, you may have other blood or imaging tests to help your health care provider understand your overall risk for other conditions. How is this treated? This condition is treated by making healthy lifestyle changes, such as eating healthy foods, exercising more, and reducing your alcohol intake. You may be referred for counseling on a healthy diet and physical activity. Your health care provider may prescribe medicine if lifestyle changes are not enough to get your blood pressure under control and if: Your systolic blood pressure is above 130. Your diastolic blood pressure is above 80. Your personal target blood pressure may vary depending on your medical conditions, your age, and other factors. Follow these instructions at home: Eating and drinking  Eat a diet that is high in fiber and potassium, and low in sodium, added sugar, and fat. An example of this eating plan is called the DASH diet. DASH stands for Dietary Approaches to Stop Hypertension. To eat this way: Eat   plenty of fresh fruits and vegetables. Try to fill one half of your plate at each meal with fruits and vegetables. Eat whole grains, such as whole-wheat pasta, brown rice, or whole-grain bread. Fill about one  fourth of your plate with whole grains. Eat or drink low-fat dairy products, such as skim milk or low-fat yogurt. Avoid fatty cuts of meat, processed or cured meats, and poultry with skin. Fill about one fourth of your plate with lean proteins, such as fish, chicken without skin, beans, eggs, or tofu. Avoid pre-made and processed foods. These tend to be higher in sodium, added sugar, and fat. Reduce your daily sodium intake. Many people with hypertension should eat less than 1,500 mg of sodium a day. Do not drink alcohol if: Your health care provider tells you not to drink. You are pregnant, may be pregnant, or are planning to become pregnant. If you drink alcohol: Limit how much you have to: 0-1 drink a day for women. 0-2 drinks a day for men. Know how much alcohol is in your drink. In the [place] one drink equals one 12 oz bottle of beer (355 mL), one 5 oz glass of wine (148 mL), or one 1 oz glass of hard liquor (44 mL). Lifestyle  Work with your health care provider to maintain a healthy body weight or to lose weight. Ask what an ideal weight is for you. Get at least 30 minutes of exercise that causes your heart to beat faster (aerobic exercise) most days of the week. Activities may include walking, swimming, or biking. Include exercise to strengthen your muscles (resistance exercise), such as Pilates or lifting weights, as part of your weekly exercise routine. Try to do these types of exercises for 30 minutes at least 3 days a week. Do not use any products that contain nicotine or tobacco. These products include cigarettes, chewing tobacco, and vaping devices, such as e-cigarettes. If you need help quitting, ask your health care provider. Monitor your blood pressure at home as told by your health care provider. Keep all follow-up visits. This is important. Medicines Take over-the-counter and prescription medicines only as told by your health care provider. Follow directions carefully. Blood  pressure medicines must be taken as prescribed. Do not skip doses of blood pressure medicine. Doing this puts you at risk for problems and can make the medicine less effective. Ask your health care provider about side effects or reactions to medicines that you should watch for. Contact a health care provider if you: Think you are having a reaction to a medicine you are taking. Have headaches that keep coming back (recurring). Feel dizzy. Have swelling in your ankles. Have trouble with your vision. Get help right away if you: Develop a severe headache or confusion. Have unusual weakness or numbness. Feel faint. Have severe pain in your chest or abdomen. Vomit repeatedly. Have trouble breathing. These symptoms may be an emergency. Get help right away. Call 911. Do not wait to see if the symptoms will go away. Do not drive yourself to the hospital. Summary Hypertension is when the force of blood pumping through your arteries is too strong. If this condition is not controlled, it may put you at risk for serious complications. Your personal target blood pressure may vary depending on your medical conditions, your age, and other factors. For most people, a normal blood pressure is less than 120/80. Hypertension is treated with lifestyle changes, medicines, or a combination of both. Lifestyle changes include losing weight, eating a healthy,   low-sodium diet, exercising more, and limiting alcohol. This information is not intended to replace advice given to you by your health care provider. Make sure you discuss any questions you have with your health care provider. Document Revised: 06/09/2021 Document Reviewed: 06/09/2021 Elsevier Patient Education  2023 [employer]  

## 2022-06-14 NOTE — Telephone Encounter (Signed)
Refill complete 

## 2022-06-14 NOTE — Progress Notes (Signed)
Subjective:    Patient ID: Danielle Ellis, female    DOB: [birth year], 66 y.o.   MRN: [id number]  Chief Complaint  Patient presents with   Medical Management of Chronic Issues   PT presents to the office today for chronic follow up and weight loss. She was started Ozempic 2 mg. She has lost 19 lbs. States her glucose has 98. She is morbid obese with a BMI of 37 with HTN and DM.      06/14/2022    2:37 PM 05/05/2022    2:16 PM 04/15/2022    8:55 AM  Last 3 Weights  Weight (lbs) 202 lb 3.2 oz 207 lb 9.6 oz 214 lb 9.6 oz  Weight (kg) 91.717 kg 94.167 kg 97.342 kg     She has hx of CABG and NSTEMI. Followed by Cardiologists every 6  months.  Diabetes She presents for her follow-up diabetic visit. She has type 2 diabetes mellitus. Hypoglycemia symptoms include nervousness/anxiousness. Pertinent negatives for hypoglycemia include no confusion. Associated symptoms include fatigue. Pertinent negatives for diabetes include no blurred vision and no foot paresthesias. Symptoms are stable. Risk factors for coronary artery disease include dyslipidemia, diabetes mellitus, hypertension and sedentary lifestyle. She is following a generally healthy diet. Her overall blood glucose range is 90-110 mg/dl. Eye exam is current.  Hypertension This is a chronic problem. The current episode started more than 1 year ago. The problem has been waxing and waning since onset. The problem is uncontrolled. Associated symptoms include anxiety. Pertinent negatives include no blurred vision, malaise/fatigue, peripheral edema or shortness of breath. Risk factors for coronary artery disease include diabetes mellitus, dyslipidemia, obesity and sedentary lifestyle. The current treatment provides moderate improvement.  Hyperlipidemia This is a chronic problem. The current episode started more than 1 year ago. The problem is controlled. Recent lipid tests were reviewed and are normal. Exacerbating diseases include obesity.  Pertinent negatives include no shortness of breath. Current antihyperlipidemic treatment includes statins. The current treatment provides moderate improvement of lipids. Risk factors for coronary artery disease include diabetes mellitus, dyslipidemia, hypertension, a sedentary lifestyle and post-menopausal.  Anxiety Presents for follow-up visit. Symptoms include excessive worry, irritability and nervous/anxious behavior. Patient reports no confusion or shortness of breath. Symptoms occur most days. The severity of symptoms is moderate.    Depression        This is a chronic problem.  The current episode started more than 1 year ago.   The onset quality is gradual.   The problem occurs intermittently.  Associated symptoms include fatigue, decreased interest and sad.  Associated symptoms include no helplessness and no hopelessness.  Past treatments include SSRIs - Selective serotonin reuptake inhibitors.  Past medical history includes anxiety.       Review of Systems  Constitutional:  Positive for fatigue and irritability. Negative for malaise/fatigue.  Eyes:  Negative for blurred vision.  Respiratory:  Negative for shortness of breath.   Psychiatric/Behavioral:  Positive for depression. Negative for confusion. The patient is nervous/anxious.   All other systems reviewed and are negative.      Objective:   Physical Exam Vitals reviewed.  Constitutional:      General: She is not in acute distress.    Appearance: She is well-developed. She is obese.  HENT:     Head: Normocephalic and atraumatic.     Right Ear: Tympanic membrane normal.     Left Ear: Tympanic membrane normal.  Eyes:     Pupils: Pupils are equal, round,  and reactive to light.  Neck:     Thyroid: No thyromegaly.  Cardiovascular:     Rate and Rhythm: Normal rate and regular rhythm.     Heart sounds: Normal heart sounds. No murmur heard. Pulmonary:     Effort: Pulmonary effort is normal. No respiratory distress.      Breath sounds: Normal breath sounds. No wheezing.  Abdominal:     General: Bowel sounds are normal. There is no distension.     Palpations: Abdomen is soft.     Tenderness: There is no abdominal tenderness.  Musculoskeletal:        General: No tenderness. Normal range of motion.     Cervical back: Normal range of motion and neck supple.  Skin:    General: Skin is warm and dry.  Neurological:     Mental Status: She is alert and oriented to person, place, and time.     Cranial Nerves: No cranial nerve deficit.     Deep Tendon Reflexes: Reflexes are normal and symmetric.  Psychiatric:        Behavior: Behavior normal.        Thought Content: Thought content normal.        Judgment: Judgment normal.       BP (!) 147/82   Pulse 79   Temp 97.9 F (36.6 C) (Temporal)   Ht 5' 1.5" (1.562 m)   Wt 202 lb 3.2 oz (91.7 kg)   BMI 37.59 kg/m      Assessment & Plan:  JILLAYNE WITTE comes in today with chief complaint of Medical Management of Chronic Issues   Diagnosis and orders addressed:  1. Need for immunization against influenza - Flu Vaccine QUAD High Dose(Fluad) - CMP14+EGFR  2. Hypertension, unspecified type Will start Norvasc 5 mg -Daily blood pressure log given with instructions on how to fill out and told to bring to next visit -Dash diet information given -Exercise encouraged - Stress Management  -Continue current meds -RTO in 1 month  - CMP14+EGFR - amLODipine (NORVASC) 5 MG tablet; Take 1 tablet (5 mg total) by mouth daily.  Dispense: 90 tablet; Refill: 1  3. Coronary artery disease involving native coronary artery of native heart without angina pectoris - CMP14+EGFR  4. Type 2 diabetes mellitus with other specified complication, without long-term current use of insulin (HCC) - CMP14+EGFR - Bayer DCA Hb A1c Waived  5. Morbid obesity ([place]) - CMP14+EGFR  6. History of non-ST elevation myocardial infarction (NSTEMI)  - CMP14+EGFR  7. S/P CABG x 5 -  CMP14+EGFR  8. GAD (generalized anxiety disorder) - CMP14+EGFR  9. Depression, major, single episode, mild (HCC) - CMP14+EGFR  10. Mixed hyperlipidemia - CMP14+EGFR   Labs pending Health Maintenance reviewed Diet and exercise encouraged  Follow up plan: 1 month to recheck HTN   Evelina Dun, FNP

## 2022-06-15 ENCOUNTER — Other Ambulatory Visit (HOSPITAL_COMMUNITY): Payer: Self-pay

## 2022-06-15 ENCOUNTER — Ambulatory Visit: Payer: HMO | Admitting: Family

## 2022-06-15 DIAGNOSIS — E1169 Type 2 diabetes mellitus with other specified complication: Secondary | ICD-10-CM

## 2022-06-15 LAB — CMP14+EGFR
ALT: 33 IU/L — ABNORMAL HIGH (ref 0–32)
AST: 26 IU/L (ref 0–40)
Albumin/Globulin Ratio: 2.2 (ref 1.2–2.2)
Albumin: 4.8 g/dL (ref 3.9–4.9)
Alkaline Phosphatase: 160 IU/L — ABNORMAL HIGH (ref 44–121)
BUN/Creatinine Ratio: 15 (ref 12–28)
BUN: 13 mg/dL (ref 8–27)
Bilirubin Total: 0.6 mg/dL (ref 0.0–1.2)
CO2: 18 mmol/L — ABNORMAL LOW (ref 20–29)
Calcium: 9.8 mg/dL (ref 8.7–10.3)
Chloride: 104 mmol/L (ref 96–106)
Creatinine, Ser: 0.86 mg/dL (ref 0.57–1.00)
Globulin, Total: 2.2 g/dL (ref 1.5–4.5)
Glucose: 79 mg/dL (ref 70–99)
Potassium: 4 mmol/L (ref 3.5–5.2)
Sodium: 142 mmol/L (ref 134–144)
Total Protein: 7 g/dL (ref 6.0–8.5)
eGFR: 74 mL/min/{1.73_m2} (ref >59)

## 2022-06-17 ENCOUNTER — Other Ambulatory Visit (HOSPITAL_COMMUNITY): Payer: Self-pay

## 2022-06-18 ENCOUNTER — Other Ambulatory Visit (HOSPITAL_COMMUNITY): Payer: Self-pay

## 2022-07-12 ENCOUNTER — Ambulatory Visit: Payer: HMO | Attending: Cardiovascular Disease | Admitting: Cardiovascular Disease

## 2022-07-12 ENCOUNTER — Encounter: Payer: Self-pay | Admitting: Cardiovascular Disease

## 2022-07-12 VITALS — BP 122/76 | HR 73 | Ht 61.5 in | Wt 198.8 lb

## 2022-07-12 DIAGNOSIS — G4733 Obstructive sleep apnea (adult) (pediatric): Secondary | ICD-10-CM

## 2022-07-12 DIAGNOSIS — I2510 Atherosclerotic heart disease of native coronary artery without angina pectoris: Secondary | ICD-10-CM | POA: Diagnosis not present

## 2022-07-12 DIAGNOSIS — E782 Mixed hyperlipidemia: Secondary | ICD-10-CM

## 2022-07-12 DIAGNOSIS — Z951 Presence of aortocoronary bypass graft: Secondary | ICD-10-CM

## 2022-07-12 DIAGNOSIS — E119 Type 2 diabetes mellitus without complications: Secondary | ICD-10-CM

## 2022-07-12 DIAGNOSIS — I10 Essential (primary) hypertension: Secondary | ICD-10-CM

## 2022-07-12 DIAGNOSIS — F419 Anxiety disorder, unspecified: Secondary | ICD-10-CM

## 2022-07-12 NOTE — Patient Instructions (Signed)
Medication Instructions:  The current medical regimen is effective;  continue present plan and medications.  *If you need a refill on your cardiac medications before your next appointment, please call your pharmacy*   Follow-Up: At [facility], you and your health needs are our priority.  As part of our continuing mission to provide you with exceptional heart care, we have created designated Provider Care Teams.  These Care Teams include your primary Cardiologist (physician) and Advanced Practice Providers (APPs -  Physician Assistants and Nurse Practitioners) who all work together to provide you with the care you need, when you need it.  We recommend signing up for the patient portal called "MyChart".  Sign up information is provided on this After Visit Summary.  MyChart is used to connect with patients for Virtual Visits (Telemedicine).  Patients are able to view lab/test results, encounter notes, upcoming appointments, etc.  Non-urgent messages can be sent to your provider as well.   To learn more about what you can do with MyChart, go to [email or web address].    Your next appointment:   12 month(s)  The format for your next appointment:   In Person  Provider:   Thomas Kelly, MD (sleep)         

## 2022-07-12 NOTE — Progress Notes (Signed)
Cardiology Office Note    Date:  07/12/2022   ID:  Danielle Ellis, DOB Jan 05, 1956, MRN [id number]  PCP:  [name], FNP  Cardiologist:  Shelva Majestic, MD (sleep); Dr Peter Martinique  7 month F/U  sleep evaluation   History of Present Illness:  Danielle Ellis is a 66 y.o. female who is followed by Dr. Peter Martinique for cardiology care.  I saw her for initial sleep evaluation on November 17, 2021.  She presents for follow-up evaluation.    Danielle Ellis has a history of hypertension, type 2 diabetes mellitus, hyperlipidemia, and known CAD.  She suffered a non-STEMI on October 09, 2020 and due to multivessel CAD underwent CABG revascularization x5 on October 14, 2020 with a LIMA to LAD, SVG to diagonal, sequential SVG to OM1 and OM 2, and SVG to PDA by Dr. Gilford Raid.  Due to concerns for obstructive sleep apnea, she was referred for a initial sleep study which was done at [facility] and on Jan 08, 2021.  She was found to have severe obstructive sleep apnea during the diagnostic portion of the study with an AHI of 30.7.  She had severe oxygen desaturation to a nadir of 77%.  CPAP was implemented but was suboptimal.  She subsequently underwent a BiPAP titration evaluation on February 10, 2021 and was titrated to 16 over 12 cm of water with residual AHI at 1.3/h and O2 nadir at 92%.  She received her ResMed AirCurve 10 VAuto BiPAP unit on February 26, 2021.  She was seen by Fabian Sharp, PA on May 05, 2021 and was 100% compliant, feeling significantly improved with BiPAP institution.  She was unaware of breakthrough snoring and that significantly reduce daytime fatigue.  I saw her for my initial sleep evaluation on November 17, 2021.  At that tme, she informed me that years ago she was originally diagnosed with obstructive sleep apnea when she was being followed by Dr. Octavio Graves.  She used CPAP for approximately 2 years and then discontinued therapy over 6 years ago.  Presently,  following reinstitution of BiPAP therapy, she feels significantly better and is tolerating BiPAP. She has had some issues with the humidification level.  And download was obtained in the office today from March 5 through November 16, 2021 which confirms 100% use.  Average use is 8 hours and 3 minutes.  At her pressure setting of 16/12, AHI is excellent at 0.7.  There is no significant mask leak.  An Epworth Sleepiness Scale score was calculated in the office today and this endorsed at 4 arguing against residual daytime sleepiness.  That evaluation, I had an extensive discussion with her regarding sleep apnea and its effects on normal sleep architecture as well as potential adverse cardiovascular consequences if left untreated. She was on losartan 100 mg and metoprolol succinate 50 mg daily for hypertension.  For optimal LDL lowering she was now on low-dose rosuvastatin 10 mg with Repatha 140 mg every 2-week injection.  She was on Lexapro for anxiety.   I last saw her, she has continued to use BiPAP therapy.  She has been started on semaglutide and has had 20 pounds of weight loss.  She has been sleeping approximately 8 hours per night and admits to excellent compliance with therapy.  A download was obtained from October 29 through July 12, 2022.  Average use is 7 hours and 45 minutes.  Her pressure setting is 16/12 and  AHI is 1.2.  She is unaware of any breakthrough snoring.  She denies any nocturia.  She continues to be on amlodipine 5 mg, losartan 100 mg, metoprolol succinate 100 mg daily for hypertension.  She continues to be on Repatha and rosuvastatin.  She has been on semaglutide but she also tells me her primary physician darted [place] which is the same drug.  She presents for follow-up evaluation.   Past Medical History:  Diagnosis Date   Anxiety    Hypertension    Sleep apnea     Past Surgical History:  Procedure Laterality Date   APPENDECTOMY     CORONARY ARTERY BYPASS GRAFT N/A 10/14/2020    Procedure: CORONARY ARTERY BYPASS GRAFTING (CABG) X FIVE, USING LEFT INTERNAL MAMMARY ARTERY AND BILATERAL LEGS GREATER SAPHENOUS VEINS HARVESTED ENDOSCOPICALLY;  Surgeon: Gaye Pollack, MD;  Location: [facility];  Service: Open Heart Surgery;  Laterality: N/A;   L ankle surgery     LEFT HEART CATH AND CORONARY ANGIOGRAPHY N/A 10/10/2020   Procedure: LEFT HEART CATH AND CORONARY ANGIOGRAPHY;  Surgeon: Martinique, Peter M, MD;  Location: [facility];  Service: Cardiovascular;  Laterality: N/A;   TEE WITHOUT CARDIOVERSION N/A 10/14/2020   Procedure: TRANSESOPHAGEAL ECHOCARDIOGRAM (TEE);  Surgeon: Gaye Pollack, MD;  Location: [facility];  Service: Open Heart Surgery;  Laterality: N/A;    Current Medications: Outpatient Medications Prior to Visit  Medication Sig Dispense Refill   acetaminophen (TYLENOL) 325 MG tablet Take 650 mg by mouth every 6 (six) hours as needed.     amLODipine (NORVASC) 5 MG tablet Take 1 tablet (5 mg total) by mouth daily. 90 tablet 1   aspirin EC 81 MG tablet Take 1 tablet (81 mg total) by mouth daily. Swallow whole. 90 tablet 3   blood glucose meter kit and supplies Dispense based on patient and insurance preference. Use up to four times daily as directed. (FOR ICD-10 E10.9, E11.9). 1 each 0   calcium gluconate 500 MG tablet Take 500 mg by mouth daily.     escitalopram (LEXAPRO) 10 MG tablet TAKE 1 TABLET BY MOUTH DAILY 90 tablet 0   Evolocumab (REPATHA SURECLICK) 102 MG/ML SOAJ INJECT 140 MG INTO THE SKIN EVERY 14 (FOURTEEN) DAYS. 6 mL 3   fluticasone (FLONASE) 50 MCG/ACT nasal spray Place 1 spray into both nostrils daily. 48 g 1   glucose blood (ONETOUCH ULTRA) test strip TEST BLOOD SUGAR 4 TIMES  DAILY 400 strip 3   Lancets (ONETOUCH ULTRASOFT) lancets TEST BLOOD SUGAR 4 TIMES  DAILY 400 each 3   latanoprost (XALATAN) 0.005 % ophthalmic solution Place 1 drop into both eyes daily 2.5 mL 6   loratadine (CLARITIN) 10 MG tablet Take 10 mg by mouth daily.     losartan (COZAAR)  100 MG tablet Take 1 tablet  by mouth daily. 90 tablet 3   metoprolol succinate (TOPROL-XL) 100 MG 24 hr tablet Take 1 tablet (100 mg total) by mouth daily. Take with or immediately following a meal. 90 tablet 3   Multiple Vitamin (MULTIVITAMIN) capsule Take 1 capsule by mouth daily.     rosuvastatin (CRESTOR) 10 MG tablet Take 1 tablet by mouth daily. 90 tablet 3   Semaglutide, 1 MG/DOSE, 4 MG/3ML SOPN Inject 1 mg as directed once a week. (Patient taking differently: Inject 2 mg as directed once a week.) 3 mL 2   No facility-administered medications prior to visit.     Allergies:   Patient has no known  allergies.   Social History   Socioeconomic History   Marital status: Married    Spouse name: Not on file   Number of children: 1   Years of education: Not on file   Highest education level: Not on file  Occupational History   Occupation: part time Engineer, materials    Comment: retired  Tobacco Use   Smoking status: Never   Smokeless tobacco: Never  Vaping Use   Vaping Use: Never used  Substance and Sexual Activity   Alcohol use: No   Drug use: No   Sexual activity: Yes    Partners: Male  Other Topics Concern   Not on file  Social History Narrative   Lives with her husband. They have one daughter, [name] - She lives 10 minutes away.   Social Determinants of Health   Financial Resource Strain: Low Risk  (12/08/2020)   Overall Financial Resource Strain (CARDIA)    Difficulty of Paying Living Expenses: Not hard at all  Food Insecurity: No Food Insecurity (12/08/2020)   Hunger Vital Sign    Worried About Running Out of Food in the Last Year: Never true    Ran Out of Food in the Last Year: Never true  Transportation Needs: No Transportation Needs (12/08/2020)   PRAPARE - Hydrologist (Medical): No    Lack of Transportation (Non-Medical): No  Physical Activity: Sufficiently Active (12/09/2021)   Exercise Vital Sign    Days of Exercise per Week: 7  days    Minutes of Exercise per Session: 40 min  Stress: No Stress Concern Present (12/09/2021)   [place]    Feeling of Stress : Only a little  Social Connections: Socially Integrated (12/08/2020)   Social Connection and Isolation Panel [NHANES]    Frequency of Communication with Friends and Family: More than three times a week    Frequency of Social Gatherings with Friends and Family: Once a week    Attends Religious Services: More than 4 times per year    Active Member of [employer] or Organizations: No    Attends Music therapist: More than 4 times per year    Marital Status: Married    Socially she is married for 46 years.  She has 1 daughter who is a Marine scientist.  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  Family History:  The patient's family history includes Cancer in her mother; Diabetes in her mother; Heart attack in her mother; Kidney disease in her mother.   ROS General: Negative; No fevers, chills, or night sweats;  HEENT: Negative; No changes in vision or hearing, sinus congestion, difficulty swallowing Pulmonary: Negative; No cough, wheezing, shortness of breath, hemoptysis Cardiovascular: No recurrent chest pain or angina GI: Negative; No nausea, vomiting, diarrhea, or abdominal pain GU: Negative; No dysuria, hematuria, or difficulty voiding Musculoskeletal: Negative; no myalgias, joint pain, or weakness Hematologic/Oncology: Negative; no easy bruising, bleeding Endocrine: Type 2 diabetes mellitus on metformin Neuro: Negative; no changes in balance, headaches Skin: Negative; No rashes or skin lesions Psychiatric: Anxiety Sleep: Negative; No snoring, daytime sleepiness, hypersomnolence, bruxism, restless legs, hypnogognic hallucinations, no cataplexy Other comprehensive 14 point system review is negative.   PHYSICAL EXAM:   VS:  BP 122/76 (BP Location: Left Arm, Patient  Position: Sitting, Cuff Size: Normal)   Pulse 73   Ht 5' 1.5" (1.562 m)   Wt 198 lb 12.8 oz (90.2  kg)   SpO2 95%   BMI 36.95 kg/m     Blood pressure by me was 124/74  Wt Readings from Last 3 Encounters:  07/12/22 198 lb 12.8 oz (90.2 kg)  06/14/22 202 lb 3.2 oz (91.7 kg)  05/05/22 207 lb 9.6 oz (94.2 kg)    General: Alert, oriented, no distress.  Moderate obesity Skin: normal turgor, no rashes, warm and dry HEENT: Normocephalic, atraumatic. Pupils equal round and reactive to light; sclera anicteric; extraocular muscles intact; Fundi ** Nose without nasal septal hypertrophy Mouth/Parynx benign; Mallinpatti scale 3/4 Neck: No JVD, no carotid bruits; normal carotid upstroke Lungs: clear to ausculatation and percussion; no wheezing or rales Chest wall: without tenderness to palpitation Heart: PMI not displaced, RRR, s1 s2 normal, 1/6 systolic murmur, no diastolic murmur, no rubs, gallops, thrills, or heaves Abdomen: soft, nontender; no hepatosplenomehaly, BS+; abdominal aorta nontender and not dilated by palpation. Back: no CVA tenderness Pulses 2+ Musculoskeletal: full range of motion, normal strength, no joint deformities Extremities: no clubbing cyanosis or edema, Homan's sign negative  Neurologic: grossly nonfocal; Cranial nerves grossly wnl Psychologic: Normal mood and affect   Studies/Labs Reviewed:   July 12, 2022 ECG (independently read by me): NSR at 73; QS V1-3, no ectopy  I personally reviewed her ECG from September 08, 2021: Normal sinus rhythm at 76, QS V1 and V2.  No ectopy.  Recent Labs:    Latest Ref Rng & Units 06/14/2022    3:01 PM 04/15/2022    9:40 AM 02/11/2022   10:30 AM  BMP  Glucose 70 - 99 mg/dL 79  104  108   BUN 8 - 27 mg/dL _0 Creatinine 0.57 - 1.00 mg/dL 0.86  0.87  0.88   BUN/Creat Ratio 12 - _1 Sodium 134 - 144 mmol/L 142  141  143   Potassium 3.5 - 5.2 mmol/L 4.0  4.2  4.5   Chloride 96 - 106 mmol/L 104  104  106    CO2 20 - 29 mmol/L _2 Calcium 8.7 - 10.3 mg/dL 9.8  9.7  9.7         Latest Ref Rng & Units 06/14/2022    3:01 PM 04/15/2022    9:40 AM 02/11/2022   10:30 AM  Hepatic Function  Total Protein 6.0 - 8.5 g/dL 7.0  7.1  7.0   Albumin 3.9 - 4.9 g/dL 4.8  4.7  4.6   AST 0 - 40 IU/L _3 ALT 0 - 32 IU/L 33  30  45   Alk Phosphatase 44 - 121 IU/L 160  136  171   Total Bilirubin 0.0 - 1.2 mg/dL 0.6  0.5  0.4        Latest Ref Rng & Units 02/11/2022   10:30 AM 02/02/2021   10:45 AM 11/10/2020   11:04 AM  CBC  WBC 3.4 - 10.8 x10E3/uL 5.3  6.0  4.8   Hemoglobin 11.1 - 15.9 g/dL 13.4  13.7  12.4   Hematocrit 34.0 - 46.6 % 40.7  43.8  39.2   Platelets 150 - 450 x10E3/uL 167  210  225    Lab Results  Component Value Date   MCV 85 02/11/2022   MCV 83 02/02/2021   MCV 86 11/10/2020   Lab Results  Component Value Date   TSH 2.220 02/11/2022   Lab Results  Component Value Date   HGBA1C 5.4 06/14/2022     BNP No results found for: "BNP"  ProBNP No results found for: "PROBNP"   Lipid Panel     Component Value Date/Time   CHOL 97 (L) 02/11/2022 1030   TRIG 188 (H) 02/11/2022 1030   HDL 44 02/11/2022 1030   CHOLHDL 2.2 02/11/2022 1030   CHOLHDL 8.2 10/10/2020 0144   VLDL 57 (H) 10/10/2020 0144   LDLCALC 23 02/11/2022 1030   LABVLDL 30 02/11/2022 1030     RADIOLOGY: No results found.   Additional studies/ records that were reviewed today include:                                           [facility]                                          Patient Name: Corry, Storie Date: 02/10/2021 Gender: Female D.O.B: June 14, 1956 Age (years): 25 Referring Provider: Coletta Memos NP Height (inches): 61 Interpreting Physician: Shelva Majestic MD, ABSM Weight (lbs): 196 RPSGT: Rosebud Poles BMI: 37 MRN: [id number] Neck Size: 15.00   CLINICAL INFORMATION The patient is referred for a BiPAP titration to treat sleep apnea.   Date of Split  Night:  01/08/2021:  AHI 30.7/h; REM AHI 52.2/h; supine sleep AHI 86.2/h; O2 nadir 77%.   SLEEP STUDY TECHNIQUE As per the AASM Manual for the Scoring of Sleep and Associated Events v2.3 (April 2016) with a hypopnea requiring 4% desaturations.   The channels recorded and monitored were frontal, central and occipital EEG, electrooculogram (EOG), submentalis EMG (chin), nasal and oral airflow, thoracic and abdominal wall motion, anterior tibialis EMG, snore microphone, electrocardiogram, and pulse oximetry. Bilevel positive airway pressure (BPAP) was initiated at the beginning of the study and titrated to treat sleep-disordered breathing.   MEDICATIONS acetaminophen (TYLENOL) 325 MG tablet aspirin EC 81 MG tablet blood glucose meter kit and supplies calcium gluconate 500 MG tablet clopidogrel (PLAVIX) 75 MG tablet escitalopram (LEXAPRO) 10 MG tablet fluticasone (FLONASE) 50 MCG/ACT nasal spray glucose blood (ONETOUCH ULTRA) test strip latanoprost (XALATAN) 0.005 % ophthalmic solution loratadine (CLARITIN) 10 MG tablet losartan (COZAAR) 50 MG tablet metFORMIN (GLUCOPHAGE) 500 MG tablet metoprolol succinate (TOPROL-XL) 50 MG 24 hr tablet Multiple Vitamin (MULTIVITAMIN) capsule rosuvastatin (CRESTOR) 10 MG tablet valACYclovir (VALTREX) 1000 MG tablet Medications self-administered by patient taken the night of the study : N/A   RESPIRATORY PARAMETERS Optimal IPAP Pressure (cm): 16        AHI at Optimal Pressure (/hr) 1.3 Optimal EPAP Pressure (cm):            12           Overall Minimal O2 (%):         88.00   Minimal O2 at Optimal Pressure (%): 92.0   SLEEP ARCHITECTURE Start Time:      11:01:39 PM    Stop Time:       5:54:58 AM      Total Time (min):         413.3   Total Sleep Time (min):      300 Sleep Latency (min):   55.3     Sleep Efficiency (%):   72.6     REM Latency (  min):    117.5   WASO (min):  58.1 Stage N1 (%): 3.83     Stage N2 (%): 34.50   Stage N3 (%): 37.50   Stage R  (%):   24.2 Supine (%):     29.83   Arousal Index (/hr):     5.2          CARDIAC DATA The 2 lead EKG demonstrated sinus rhythm. The mean heart rate was 51.83 beats per minute. Other EKG findings include: PVCs.   LEG MOVEMENT DATA The total Periodic Limb Movements of Sleep (PLMS) were 37. The PLMS index was 7.40. A PLMS index of <15 is considered normal in adults.   IMPRESSIONS - BiPAP was initiated at 8/4 and was titrated to optimal PAP pressure at 16 /12  cm of water); AHI 1.3/h; O2 nadir 92%. - Central sleep apnea was not noted during this titration (CAI = 2.8/h). - Mild oxygen desaturations to a nadir of 88.00% at 13/9 cm of water.  - The patient snored with moderate snoring volume. - 2-lead EKG demonstrated: PVCs - Mild periodic limb movements were observed during this study. Arousals associated with PLMs were rare.   DIAGNOSIS - Obstructive Sleep Apnea (G47.33)   RECOMMENDATIONS - Recommend an initial trial of BiPAP therapy with EPR at 16/12 cm H2O with heated humidification.  A small size Philips Respironics Full Face Mask Dreamwear mask was used for the titration study.  - Effort should be made to optimize nasal and oropharyngeal patency. - Avoid alcohol, sedatives and other CNS depressants that may worsen sleep apnea and disrupt normal sleep architecture. - Sleep hygiene should be reviewed to assess factors that may improve sleep quality. - Weight management (BMI 37) and regular exercise should be initiated or continued. - Recommend a download in 30 days and sleep clinic evaluation after 4 weeks of therapy.    ASSESSMENT:    1. OSA (obstructive sleep apnea)   2. CAD in native artery   3. S/P CABG x 5   4. Essential hypertension   5. Type 2 diabetes mellitus without complication, without long-term current use of insulin ([place])   6. Mixed hyperlipidemia   7. Anxiety   8. Morbid obesity [facility])     PLAN:  Danielle Ellis is a 66 year old female who has a history of  hypertension, hyperlipidemia, diabetes mellitus, generalized anxiety disorder, and following a non-STEMI was found to have significant multivessel coronary artery disease.  She underwent successful CABG surgery x5 on October 14, 2020 by Dr. Cyndia Bent and cardiovascularly has remained relatively stable.  Remotely, she had been diagnosed with obstructive sleep apnea when cared for by Dr. Octavio Graves and states that she may have been on CPAP therapy for 2 years but ultimately stopped treatment over 6 years ago.  She underwent a new sleep evaluation in May and June 2022 and was found to have severe obstructive sleep apnea with an overall AHI of 30.7/h, and more severe sleep apnea during REM sleep with an AHI 52.2/h.  Episodes were very severe with supine sleep with an AHI of 86.2/h and significant oxygen desaturation to 77%.  She did not do well with CPAP and ultimately required BiPAP titration.  BiPAP set up date was February 26, 2021.  Since initiating BiPAP therapy she has felt significantly improved with sleep quality and energy and there has been resolution of prior snoring as well as nocturia.  At her initial evaluation, I had I had a very long discussion with  her and discussed normal sleep architecture and the disruptive sleep architecture associated with obstructive sleep apnea.  I reviewed in detail the effect on cardiovascular health if patients have untreated sleep apnea with particular reference to its effects on hypertension, nocturnal arrhythmias with increased sympathetic tone contributing to palpitations as well as increased atrial fibrillation risk.  I discussed its effects on insulin resistance contributing to increased glucose, GERD, as well as potential increased inflammation.  In addition we discussed nocturnal hypoxemia at contributing potentially to nocturnal ischemia both cardiac as well as cerebrovascular.  I also discussed the pathophysiology associated with frequent nocturia seen in patients with  untreated sleep apnea and the benefit of therapy.  Presently, she is compliant with therapy.  Her most recent download shows an AHI of 1.2 at 16/12 pressure and average use is 7 hours and 45 minutes.  An Epworth scale was calculated in the office today and this endorsed at 4 arguing against residual daytime sleepiness.  Blood pressure today is stable on combination therapy with amlodipine 5 mg, losartan 100 mg, and metoprolol succinate 100 mg daily.  She continues to be on low-dose rosuvastatin with Repatha every 2-week injection.  She tells me she had recently been started on semaglutide but she also tells me her primary physician also started Ozempic which is the same medication.  I have recommended she contact her physician to discuss.  My sleep perspective, she is doing well.  [facility] is her DME company.  We will contact them so that she can obtain new supplies.  I will see her in 1 year for follow-up sleep evaluation.     Medication Adjustments/Labs and Tests Ordered: Current medicines are reviewed at length with the patient today.  Concerns regarding medicines are outlined above.  Medication changes, Labs and Tests ordered today are listed in the Patient Instructions below. Patient Instructions  Medication Instructions:  The current medical regimen is effective;  continue present plan and medications.  *If you need a refill on your cardiac medications before your next appointment, please call your pharmacy*   Follow-Up: At [facility], you and your health needs are our priority.  As part of our continuing mission to provide you with exceptional heart care, we have created designated Provider Care Teams.  These Care Teams include your primary Cardiologist (physician) and Advanced Practice Providers (APPs -  Physician Assistants and Nurse Practitioners) who all work together to provide you with the care you need, when you need it.  We recommend signing up for the patient  portal called "MyChart".  Sign up information is provided on this After Visit Summary.  MyChart is used to connect with patients for Virtual Visits (Telemedicine).  Patients are able to view lab/test results, encounter notes, upcoming appointments, etc.  Non-urgent messages can be sent to your provider as well.   To learn more about what you can do with MyChart, go to [email or web address].    Your next appointment:   12 month(s)  The format for your next appointment:   In Person  Provider:   Shelva Majestic, MD (sleep)          Signed, Shelva Majestic, MD  07/12/2022 1:35 PM    [facility] [address] [place], [place], [place]  [address] Phone: [phone]

## 2022-07-16 ENCOUNTER — Ambulatory Visit (INDEPENDENT_AMBULATORY_CARE_PROVIDER_SITE_OTHER): Payer: HMO | Admitting: Family

## 2022-07-16 ENCOUNTER — Encounter: Payer: Self-pay | Admitting: Family

## 2022-07-16 VITALS — BP 135/78 | HR 74 | Temp 98.0°F | Ht 61.5 in | Wt 195.0 lb

## 2022-07-16 DIAGNOSIS — E1169 Type 2 diabetes mellitus with other specified complication: Secondary | ICD-10-CM

## 2022-07-16 DIAGNOSIS — I10 Essential (primary) hypertension: Secondary | ICD-10-CM

## 2022-07-16 NOTE — Progress Notes (Signed)
Subjective:    Patient ID: Danielle Ellis, female    DOB: 12-24-1955, 66 y.o.   MRN: [id number]  Chief Complaint  Patient presents with   Follow-up   Hypertension   PT presents to the office today for hypertension and weight loss. She was started Ozempic 2 mg. She has lost 22 lbs. She is morbid obese with a BMI of 36 with HTN and DM.      07/16/2022    9:37 AM 07/12/2022    7:47 AM 06/14/2022    2:37 PM  Last 3 Weights  Weight (lbs) 195 lb 198 lb 12.8 oz 202 lb 3.2 oz  Weight (kg) 88.451 kg 90.175 kg 91.717 kg    She has hx of CABG and NSTEMI. Followed by Cardiologists every 6  months.    Last visit we added Norvasc 5 mg. Her BP is not at goal.  Hypertension This is a chronic problem. The current episode started more than 1 year ago. The problem has been waxing and waning since onset. The problem is controlled. Associated symptoms include malaise/fatigue. Pertinent negatives include no blurred vision, peripheral edema or shortness of breath. Risk factors for coronary artery disease include diabetes mellitus and dyslipidemia. The current treatment provides moderate improvement. Hypertensive end-organ damage includes CAD/MI.  Diabetes She presents for her follow-up diabetic visit. She has type 2 diabetes mellitus. Pertinent negatives for diabetes include no blurred vision and no foot paresthesias. Symptoms are stable. Diabetic complications include heart disease. Risk factors for coronary artery disease include dyslipidemia, diabetes mellitus, hypertension and sedentary lifestyle. She is following a generally healthy diet. Her overall blood glucose range is 70-90 mg/dl.      Review of Systems  Constitutional:  Positive for malaise/fatigue.  Eyes:  Negative for blurred vision.  Respiratory:  Negative for shortness of breath.   All other systems reviewed and are negative.      Objective:   Physical Exam Vitals reviewed.  Constitutional:      General: She is not in acute  distress.    Appearance: She is well-developed. She is obese.  HENT:     Head: Normocephalic and atraumatic.     Right Ear: Tympanic membrane normal.     Left Ear: Tympanic membrane normal.  Eyes:     Pupils: Pupils are equal, round, and reactive to light.  Neck:     Thyroid: No thyromegaly.  Cardiovascular:     Rate and Rhythm: Normal rate and regular rhythm.     Heart sounds: Normal heart sounds. No murmur heard. Pulmonary:     Effort: Pulmonary effort is normal. No respiratory distress.     Breath sounds: Normal breath sounds. No wheezing.  Abdominal:     General: Bowel sounds are normal. There is no distension.     Palpations: Abdomen is soft.     Tenderness: There is no abdominal tenderness.  Musculoskeletal:        General: No tenderness. Normal range of motion.     Cervical back: Normal range of motion and neck supple.  Skin:    General: Skin is warm and dry.  Neurological:     Mental Status: She is alert and oriented to person, place, and time.     Cranial Nerves: No cranial nerve deficit.     Deep Tendon Reflexes: Reflexes are normal and symmetric.  Psychiatric:        Behavior: Behavior normal.        Thought Content: Thought content normal.  Judgment: Judgment normal.       BP 135/78   Pulse 74   Temp 98 F (36.7 C) (Temporal)   Ht 5' 1.5" (1.562 m)   Wt 195 lb (88.5 kg)   SpO2 97%   BMI 36.25 kg/m      Assessment & Plan:  TENSLEY WERY comes in today with chief complaint of Follow-up and Hypertension   Diagnosis and orders addressed:  1. Hypertension, unspecified type - CMP14+EGFR  2. Type 2 diabetes mellitus with other specified complication, without long-term current use of insulin (HCC) - CMP14+EGFR  3. Morbid obesity ([place]) - CMP14+EGFR   Labs pending Health Maintenance reviewed Diet and exercise encouraged  Follow up plan: 3 months    Evelina Dun, FNP

## 2022-07-16 NOTE — Patient Instructions (Signed)
Hypertension, Adult High blood pressure (hypertension) is when the force of blood pumping through the arteries is too strong. The arteries are the blood vessels that carry blood from the heart throughout the body. Hypertension forces the heart to work harder to pump blood and may cause arteries to become narrow or stiff. Untreated or uncontrolled hypertension can lead to a heart attack, heart failure, a stroke, kidney disease, and other problems. A blood pressure reading consists of a higher number over a lower number. Ideally, your blood pressure should be below 120/80. The first ("top") number is called the systolic pressure. It is a measure of the pressure in your arteries as your heart beats. The second ("bottom") number is called the diastolic pressure. It is a measure of the pressure in your arteries as the heart relaxes. What are the causes? The exact cause of this condition is not known. There are some conditions that result in high blood pressure. What increases the risk? Certain factors may make you more likely to develop high blood pressure. Some of these risk factors are under your control, including: Smoking. Not getting enough exercise or physical activity. Being overweight. Having too much fat, sugar, calories, or salt (sodium) in your diet. Drinking too much alcohol. Other risk factors include: Having a personal history of heart disease, diabetes, high cholesterol, or kidney disease. Stress. Having a family history of high blood pressure and high cholesterol. Having obstructive sleep apnea. Age. The risk increases with age. What are the signs or symptoms? High blood pressure may not cause symptoms. Very high blood pressure (hypertensive crisis) may cause: Headache. Fast or irregular heartbeats (palpitations). Shortness of breath. Nosebleed. Nausea and vomiting. Vision changes. Severe chest pain, dizziness, and seizures. How is this diagnosed? This condition is diagnosed by  measuring your blood pressure while you are seated, with your arm resting on a flat surface, your legs uncrossed, and your feet flat on the floor. The cuff of the blood pressure monitor will be placed directly against the skin of your upper arm at the level of your heart. Blood pressure should be measured at least twice using the same arm. Certain conditions can cause a difference in blood pressure between your right and left arms. If you have a high blood pressure reading during one visit or you have normal blood pressure with other risk factors, you may be asked to: Return on a different day to have your blood pressure checked again. Monitor your blood pressure at home for 1 week or longer. If you are diagnosed with hypertension, you may have other blood or imaging tests to help your health care provider understand your overall risk for other conditions. How is this treated? This condition is treated by making healthy lifestyle changes, such as eating healthy foods, exercising more, and reducing your alcohol intake. You may be referred for counseling on a healthy diet and physical activity. Your health care provider may prescribe medicine if lifestyle changes are not enough to get your blood pressure under control and if: Your systolic blood pressure is above 130. Your diastolic blood pressure is above 80. Your personal target blood pressure may vary depending on your medical conditions, your age, and other factors. Follow these instructions at home: Eating and drinking  Eat a diet that is high in fiber and potassium, and low in sodium, added sugar, and fat. An example of this eating plan is called the DASH diet. DASH stands for Dietary Approaches to Stop Hypertension. To eat this way: Eat   plenty of fresh fruits and vegetables. Try to fill one half of your plate at each meal with fruits and vegetables. Eat whole grains, such as whole-wheat pasta, brown rice, or whole-grain bread. Fill about one  fourth of your plate with whole grains. Eat or drink low-fat dairy products, such as skim milk or low-fat yogurt. Avoid fatty cuts of meat, processed or cured meats, and poultry with skin. Fill about one fourth of your plate with lean proteins, such as fish, chicken without skin, beans, eggs, or tofu. Avoid pre-made and processed foods. These tend to be higher in sodium, added sugar, and fat. Reduce your daily sodium intake. Many people with hypertension should eat less than 1,500 mg of sodium a day. Do not drink alcohol if: Your health care provider tells you not to drink. You are pregnant, may be pregnant, or are planning to become pregnant. If you drink alcohol: Limit how much you have to: 0-1 drink a day for women. 0-2 drinks a day for men. Know how much alcohol is in your drink. In the [place] one drink equals one 12 oz bottle of beer (355 mL), one 5 oz glass of wine (148 mL), or one 1 oz glass of hard liquor (44 mL). Lifestyle  Work with your health care provider to maintain a healthy body weight or to lose weight. Ask what an ideal weight is for you. Get at least 30 minutes of exercise that causes your heart to beat faster (aerobic exercise) most days of the week. Activities may include walking, swimming, or biking. Include exercise to strengthen your muscles (resistance exercise), such as Pilates or lifting weights, as part of your weekly exercise routine. Try to do these types of exercises for 30 minutes at least 3 days a week. Do not use any products that contain nicotine or tobacco. These products include cigarettes, chewing tobacco, and vaping devices, such as e-cigarettes. If you need help quitting, ask your health care provider. Monitor your blood pressure at home as told by your health care provider. Keep all follow-up visits. This is important. Medicines Take over-the-counter and prescription medicines only as told by your health care provider. Follow directions carefully. Blood  pressure medicines must be taken as prescribed. Do not skip doses of blood pressure medicine. Doing this puts you at risk for problems and can make the medicine less effective. Ask your health care provider about side effects or reactions to medicines that you should watch for. Contact a health care provider if you: Think you are having a reaction to a medicine you are taking. Have headaches that keep coming back (recurring). Feel dizzy. Have swelling in your ankles. Have trouble with your vision. Get help right away if you: Develop a severe headache or confusion. Have unusual weakness or numbness. Feel faint. Have severe pain in your chest or abdomen. Vomit repeatedly. Have trouble breathing. These symptoms may be an emergency. Get help right away. Call 911. Do not wait to see if the symptoms will go away. Do not drive yourself to the hospital. Summary Hypertension is when the force of blood pumping through your arteries is too strong. If this condition is not controlled, it may put you at risk for serious complications. Your personal target blood pressure may vary depending on your medical conditions, your age, and other factors. For most people, a normal blood pressure is less than 120/80. Hypertension is treated with lifestyle changes, medicines, or a combination of both. Lifestyle changes include losing weight, eating a healthy,   low-sodium diet, exercising more, and limiting alcohol. This information is not intended to replace advice given to you by your health care provider. Make sure you discuss any questions you have with your health care provider. Document Revised: 06/09/2021 Document Reviewed: 06/09/2021 Elsevier Patient Education  2023 [employer]  

## 2022-07-17 LAB — CMP14+EGFR
ALT: 63 IU/L — ABNORMAL HIGH (ref 0–32)
AST: 39 IU/L (ref 0–40)
Albumin/Globulin Ratio: 2.1 (ref 1.2–2.2)
Albumin: 4.8 g/dL (ref 3.9–4.9)
Alkaline Phosphatase: 152 IU/L — ABNORMAL HIGH (ref 44–121)
BUN/Creatinine Ratio: 12 (ref 12–28)
BUN: 11 mg/dL (ref 8–27)
Bilirubin Total: 0.6 mg/dL (ref 0.0–1.2)
CO2: 22 mmol/L (ref 20–29)
Calcium: 9.6 mg/dL (ref 8.7–10.3)
Chloride: 104 mmol/L (ref 96–106)
Creatinine, Ser: 0.89 mg/dL (ref 0.57–1.00)
Globulin, Total: 2.3 g/dL (ref 1.5–4.5)
Glucose: 89 mg/dL (ref 70–99)
Potassium: 4.1 mmol/L (ref 3.5–5.2)
Sodium: 144 mmol/L (ref 134–144)
Total Protein: 7.1 g/dL (ref 6.0–8.5)
eGFR: 71 mL/min/{1.73_m2} (ref >59)

## 2022-07-28 ENCOUNTER — Telehealth: Payer: Self-pay | Admitting: Family

## 2022-08-02 NOTE — Telephone Encounter (Signed)
REFAXED INCOME TO [employer]

## 2022-08-04 ENCOUNTER — Telehealth: Payer: Self-pay | Admitting: Family

## 2022-08-05 NOTE — Telephone Encounter (Signed)
Left message informing patient that income was faxed to [employer] 08/02/22 and that a decision/shipment from the company could take up to 6 weeks. Informed pt to keep an eye out for mail from them or a call from [place].

## 2022-08-10 ENCOUNTER — Other Ambulatory Visit (HOSPITAL_COMMUNITY): Payer: Self-pay

## 2022-08-11 ENCOUNTER — Other Ambulatory Visit: Payer: Self-pay

## 2022-08-23 ENCOUNTER — Telehealth: Payer: Self-pay

## 2022-08-23 NOTE — Telephone Encounter (Signed)
Received notification from [place] regarding RE-ENROLLMENT approval for [place]. Patient assistance approved from 08/18/22 to 08/16/23.  Phone: [phone]

## 2022-08-31 ENCOUNTER — Ambulatory Visit: Payer: HMO | Admitting: Pharmacist

## 2022-08-31 VITALS — BP 135/82

## 2022-08-31 DIAGNOSIS — I10 Essential (primary) hypertension: Secondary | ICD-10-CM

## 2022-08-31 DIAGNOSIS — E1169 Type 2 diabetes mellitus with other specified complication: Secondary | ICD-10-CM

## 2022-08-31 MED ORDER — SEMAGLUTIDE (2 MG/DOSE) 8 MG/3ML ~~LOC~~ SOPN: 2.0000 mg | PEN_INJECTOR | SUBCUTANEOUS | 0 refills | Status: DC

## 2022-08-31 NOTE — Progress Notes (Signed)
Chronic Care Management Pharmacy Note  08/31/2022 Name:  Danielle Ellis MRN:  [id number] DOB:  [birth year]  Summary: Diabetes: Goal on Track (progressing): YES. controlled; current treatment:OZEMPIC 2MG ;  Patient is currently controlled, however she is now having difficulty paying for ozempic in the coverage gap Metformin d/c'd Denies personal and family history of Medullary thyroid cancer (MTC) Patient reports 30lb weight loss A1c 5.9%, GFR 73 Current glucose readings: fasting glucose: <130, post prandial glucose: n/a Denies hypoglycemic/hyperglycemic symptoms Current exercise: encouraged Assessed patient finances. Enrolled in [employer] PAP; [employer] grant for repatha via heart care  Hypertension -improving--cards added new amlodipine; patient reports BP averages around 130/80s  Patient Goals/Self-Care Activities patient will:  - take medications as prescribed as evidenced by patient report and record review check glucose DAILY-FASTING OR IF SYMPTOMATIC, document, and provide at future appointments collaborate with provider on medication access solutions target a minimum of 150 minutes of moderate intensity exercise weekly engage in dietary modifications by FOLLOWING A HEART HEALTHY DIET/HEALTHY PLATE METHOD  Subjective: Danielle Ellis is an 67 y.o. year old female who is a primary patient of 67, FNP.  The patient was referred to the Chronic Care Management team for assistance with care management needs subsequent to provider initiation of CCM services and plan of care.    Engaged with patient by telephone for follow up visit in response to provider referral for CCM services.   Objective:  LABS:   Lab Results  Component Value Date   CREATININE 0.89 07/16/2022   CREATININE 0.86 06/14/2022   CREATININE 0.87 04/15/2022     Lab Results  Component Value Date   HGBA1C 5.4 06/14/2022         Component Value Date/Time   CHOL 97 (L) 02/11/2022 1030    TRIG 188 (H) 02/11/2022 1030   HDL 44 02/11/2022 1030   CHOLHDL 2.2 02/11/2022 1030   CHOLHDL 8.2 10/10/2020 0144   VLDL 57 (H) 10/10/2020 0144   LDLCALC 23 02/11/2022 1030     Clinical ASCVD: Yes   The ASCVD Risk score (Arnett DK, et al., 2019) failed to calculate for the following reasons:   The patient has a prior MI or stroke diagnosis    Other: (CHADS2VASc if Afib, PHQ9 if depression, MMRC or CAT for COPD, ACT, DEXA)    BP Readings from Last 3 Encounters:  08/31/22 135/82  07/16/22 135/78  07/12/22 122/76      SDOH:  (Social Determinants of Health) assessments and interventions performed:    No Known Allergies  Medications Reviewed Today     Reviewed by 07/14/22, [facility] (Pharmacist) on 08/31/22 at 0950  Med List Status: <None>   Medication Order Taking? Sig Documenting Provider Last Dose Status Informant  acetaminophen (TYLENOL) 325 MG tablet 09/02/22 No Take 650 mg by mouth every 6 (six) hours as needed. [provider] Taking Active   amLODipine (NORVASC) 5 MG tablet [id number] No Take 1 tablet (5 mg total) by mouth daily. [id number], FNP Taking Active   aspirin EC 81 MG tablet Junie Spencer No Take 1 tablet (81 mg total) by mouth daily. Swallow whole. [id number], Peter M, MD Taking Active   blood glucose meter kit and supplies [place] No Dispense based on patient and insurance preference. Use up to four times daily as directed. (FOR ICD-10 E10.9, E11.9). [id number], FNP Taking Active   calcium gluconate 500 MG tablet Junie Spencer No Take 500 mg by mouth daily. [provider]  Taking Active Self  escitalopram (LEXAPRO) 10 MG tablet [id number] No TAKE 1 TABLET BY MOUTH DAILY Hawks, Christy A, FNP Taking Active   Evolocumab (REPATHA SURECLICK) 308 MG/ML SOAJ [id number] No INJECT 140 MG INTO THE SKIN EVERY 14 (FOURTEEN) DAYS. [place], Peter M, MD Taking Active   fluticasone [facility]) 50 MCG/ACT nasal spray [id number] No Place 1 spray into both nostrils  daily. [name], FNP Taking Active   glucose blood (ONETOUCH ULTRA) test strip [id number] No TEST BLOOD SUGAR 4 TIMES  DAILY [name], FNP Taking Active   Lancets [facility] ULTRASOFT) lancets [id number] No TEST BLOOD SUGAR 4 TIMES  DAILY Hawks, Christy A, FNP Taking Active   latanoprost (XALATAN) 0.005 % ophthalmic solution [id number] No Place 1 drop into both eyes daily  Taking Active   loratadine (CLARITIN) 10 MG tablet [id number] No Take 10 mg by mouth daily. [provider] Taking Active   losartan (COZAAR) 100 MG tablet [id number] No Take 1 tablet  by mouth daily. [place], Peter M, MD Taking Active   metoprolol succinate (TOPROL-XL) 100 MG 24 hr tablet [id number] No Take 1 tablet (100 mg total) by mouth daily. Take with or immediately following a meal. [place], Peter M, MD Taking Active   Multiple Vitamin (MULTIVITAMIN) capsule [id number] No Take 1 capsule by mouth daily. [provider] Taking Active Self  rosuvastatin (CRESTOR) 10 MG tablet [id number] No Take 1 tablet by mouth daily. [place], Peter M, MD Taking Active   Semaglutide, 1 MG/DOSE, 4 MG/3ML [facility] [facility] [id number] No Inject 1 mg as directed once a week.  Patient taking differently: Inject 2 mg as directed once a week.   [name], FNP Taking Active            Med Note Blanca Friend, Royce Macadamia   Tue May 11, 2022  2:30 PM) Via [employer] patient assistance program                Goals Addressed               This Visit's Progress     Patient Stated     T2DM PHARMD GOAL (pt-stated)        Current Barriers:  Unable to independently afford treatment regimen Unable to maintain control of T2DM  Pharmacist Clinical Goal(s):  patient will verbalize ability to afford treatment regimen maintain control of T2DM as evidenced by GOAL A1C  through collaboration with PharmD and provider.   Interventions: 1:1 collaboration with [name], FNP regarding development and update of comprehensive plan of  care as evidenced by provider attestation and co-signature Inter-disciplinary care team collaboration (see longitudinal plan of care) Comprehensive medication review performed; medication list updated in electronic medical record  Diabetes: Goal on Track (progressing): YES. controlled; current treatment:OZEMPIC 2MG ;  Patient is currently controlled, however she is now having difficulty paying for ozempic in the coverage gap Metformin d/c'd Denies personal and family history of Medullary thyroid cancer (MTC) Patient reports 30lb weight loss A1c 5.9%, GFR 73 Current glucose readings: fasting glucose: <130, post prandial glucose: n/a Denies hypoglycemic/hyperglycemic symptoms Current exercise: encouraged Assessed patient finances. Enrolled in [employer] PAP; healthwell grant for repatha via heart care  Hypertension -improving--cards added new amlodipine; patient reports BP averages around 130/80s  Patient Goals/Self-Care Activities patient will:  - take medications as prescribed as evidenced by patient report and record review check glucose DAILY-FASTING OR IF SYMPTOMATIC, document, and provide at future appointments collaborate with provider on medication access solutions  target a minimum of 150 minutes of moderate intensity exercise weekly engage in dietary modifications by FOLLOWING A HEART HEALTHY DIET/HEALTHY PLATE METHOD          Plan: No further follow up required: unenrolled from CCM    Regina Eck, PharmD, BCPS, [place] Clinical Pharmacist, [place]  II  T [phone]

## 2022-09-07 ENCOUNTER — Other Ambulatory Visit: Payer: Self-pay | Admitting: Family

## 2022-09-07 DIAGNOSIS — F411 Generalized anxiety disorder: Secondary | ICD-10-CM

## 2022-09-07 DIAGNOSIS — F320 Major depressive disorder, single episode, mild: Secondary | ICD-10-CM

## 2022-09-08 ENCOUNTER — Other Ambulatory Visit: Payer: Self-pay

## 2022-09-08 ENCOUNTER — Other Ambulatory Visit (HOSPITAL_COMMUNITY): Payer: Self-pay

## 2022-09-08 MED ORDER — ESCITALOPRAM OXALATE 10 MG PO TABS
10.0000 mg | ORAL_TABLET | Freq: Every day | ORAL | 0 refills | Status: DC
  Filled 2022-09-08: qty 90, 90d supply, fill #0

## 2022-09-10 ENCOUNTER — Other Ambulatory Visit: Payer: Self-pay

## 2022-09-21 DIAGNOSIS — G4733 Obstructive sleep apnea (adult) (pediatric): Secondary | ICD-10-CM | POA: Diagnosis not present

## 2022-10-14 ENCOUNTER — Other Ambulatory Visit: Payer: Self-pay | Admitting: Family

## 2022-10-14 DIAGNOSIS — F320 Major depressive disorder, single episode, mild: Secondary | ICD-10-CM

## 2022-10-14 DIAGNOSIS — I10 Essential (primary) hypertension: Secondary | ICD-10-CM

## 2022-10-14 DIAGNOSIS — F411 Generalized anxiety disorder: Secondary | ICD-10-CM

## 2022-10-15 ENCOUNTER — Other Ambulatory Visit (HOSPITAL_BASED_OUTPATIENT_CLINIC_OR_DEPARTMENT_OTHER): Payer: Self-pay

## 2022-10-15 ENCOUNTER — Other Ambulatory Visit (HOSPITAL_COMMUNITY): Payer: Self-pay

## 2022-10-15 ENCOUNTER — Encounter: Payer: Self-pay | Admitting: Family

## 2022-10-15 ENCOUNTER — Ambulatory Visit (INDEPENDENT_AMBULATORY_CARE_PROVIDER_SITE_OTHER): Payer: HMO | Admitting: Family

## 2022-10-15 ENCOUNTER — Other Ambulatory Visit: Payer: Self-pay

## 2022-10-15 VITALS — BP 131/72 | HR 59 | Temp 97.7°F | Ht 61.5 in | Wt 195.0 lb

## 2022-10-15 DIAGNOSIS — I10 Essential (primary) hypertension: Secondary | ICD-10-CM | POA: Diagnosis not present

## 2022-10-15 DIAGNOSIS — E1169 Type 2 diabetes mellitus with other specified complication: Secondary | ICD-10-CM | POA: Diagnosis not present

## 2022-10-15 DIAGNOSIS — I252 Old myocardial infarction: Secondary | ICD-10-CM

## 2022-10-15 DIAGNOSIS — F411 Generalized anxiety disorder: Secondary | ICD-10-CM

## 2022-10-15 DIAGNOSIS — Z6836 Body mass index (BMI) 36.0-36.9, adult: Secondary | ICD-10-CM | POA: Diagnosis not present

## 2022-10-15 DIAGNOSIS — F320 Major depressive disorder, single episode, mild: Secondary | ICD-10-CM | POA: Diagnosis not present

## 2022-10-15 DIAGNOSIS — E782 Mixed hyperlipidemia: Secondary | ICD-10-CM | POA: Diagnosis not present

## 2022-10-15 LAB — CMP14+EGFR
ALT: 93 IU/L — ABNORMAL HIGH (ref 0–32)
AST: 56 IU/L — ABNORMAL HIGH (ref 0–40)
Albumin/Globulin Ratio: 2.1 (ref 1.2–2.2)
Albumin: 4.7 g/dL (ref 3.9–4.9)
Alkaline Phosphatase: 181 IU/L — ABNORMAL HIGH (ref 44–121)
BUN/Creatinine Ratio: 16 (ref 12–28)
BUN: 15 mg/dL (ref 8–27)
Bilirubin Total: 0.5 mg/dL (ref 0.0–1.2)
CO2: 21 mmol/L (ref 20–29)
Calcium: 9.3 mg/dL (ref 8.7–10.3)
Chloride: 104 mmol/L (ref 96–106)
Creatinine, Ser: 0.94 mg/dL (ref 0.57–1.00)
Globulin, Total: 2.2 g/dL (ref 1.5–4.5)
Glucose: 84 mg/dL (ref 70–99)
Potassium: 4.2 mmol/L (ref 3.5–5.2)
Sodium: 142 mmol/L (ref 134–144)
Total Protein: 6.9 g/dL (ref 6.0–8.5)
eGFR: 66 mL/min/{1.73_m2} (ref >59)

## 2022-10-15 LAB — BAYER DCA HB A1C WAIVED: HB A1C (BAYER DCA - WAIVED): 6 % — ABNORMAL HIGH (ref 4.8–5.6)

## 2022-10-15 MED ORDER — ESCITALOPRAM OXALATE 10 MG PO TABS
10.0000 mg | ORAL_TABLET | Freq: Every day | ORAL | 2 refills | Status: DC
  Filled 2022-10-15 – 2022-12-07 (×2): qty 90, 90d supply, fill #0
  Filled 2023-02-05 – 2023-03-06 (×2): qty 90, 90d supply, fill #1
  Filled 2023-05-31: qty 90, 90d supply, fill #2

## 2022-10-15 MED ORDER — AMLODIPINE BESYLATE 5 MG PO TABS
5.0000 mg | ORAL_TABLET | Freq: Every day | ORAL | 0 refills | Status: DC
  Filled 2022-10-15: qty 90, 90d supply, fill #0

## 2022-10-15 MED ORDER — ESCITALOPRAM OXALATE 10 MG PO TABS
10.0000 mg | ORAL_TABLET | Freq: Every day | ORAL | 0 refills | Status: DC
  Filled 2022-10-15: qty 90, 90d supply, fill #0

## 2022-10-15 MED ORDER — AMLODIPINE BESYLATE 5 MG PO TABS
5.0000 mg | ORAL_TABLET | Freq: Every day | ORAL | 2 refills | Status: DC
  Filled 2022-10-15 – 2022-12-07 (×2): qty 90, 90d supply, fill #0
  Filled 2023-02-05 – 2023-03-10 (×2): qty 90, 90d supply, fill #1

## 2022-10-15 NOTE — Patient Instructions (Signed)
Health Maintenance After Age 69 After age 69, you are at a higher risk for certain long-term diseases and infections as well as injuries from falls. Falls are a major cause of broken bones and head injuries in people who are older than age 69. Getting regular preventive care can help to keep you healthy and well. Preventive care includes getting regular testing and making lifestyle changes as recommended by your health care provider. Talk with your health care provider about: Which screenings and tests you should have. A screening is a test that checks for a disease when you have no symptoms. A diet and exercise plan that is right for you. What should I know about screenings and tests to prevent falls? Screening and testing are the best ways to find a health problem early. Early diagnosis and treatment give you the best chance of managing medical conditions that are common after age 69. Certain conditions and lifestyle choices may make you more likely to have a fall. Your health care provider may recommend: Regular vision checks. Poor vision and conditions such as cataracts can make you more likely to have a fall. If you wear glasses, make sure to get your prescription updated if your vision changes. Medicine review. Work with your health care provider to regularly review all of the medicines you are taking, including over-the-counter medicines. Ask your health care provider about any side effects that may make you more likely to have a fall. Tell your health care provider if any medicines that you take make you feel dizzy or sleepy. Strength and balance checks. Your health care provider may recommend certain tests to check your strength and balance while standing, walking, or changing positions. Foot health exam. Foot pain and numbness, as well as not wearing proper footwear, can make you more likely to have a fall. Screenings, including: Osteoporosis screening. Osteoporosis is a condition that causes  the bones to get weaker and break more easily. Blood pressure screening. Blood pressure changes and medicines to control blood pressure can make you feel dizzy. Depression screening. You may be more likely to have a fall if you have a fear of falling, feel depressed, or feel unable to do activities that you used to do. Alcohol use screening. Using too much alcohol can affect your balance and may make you more likely to have a fall. Follow these instructions at home: Lifestyle Do not drink alcohol if: Your health care provider tells you not to drink. If you drink alcohol: Limit how much you have to: 0-1 drink a day for women. 0-2 drinks a day for men. Know how much alcohol is in your drink. In the [place] one drink equals one 12 oz bottle of beer (355 mL), one 5 oz glass of wine (148 mL), or one 1 oz glass of hard liquor (44 mL). Do not use any products that contain nicotine or tobacco. These products include cigarettes, chewing tobacco, and vaping devices, such as e-cigarettes. If you need help quitting, ask your health care provider. Activity  Follow a regular exercise program to stay fit. This will help you maintain your balance. Ask your health care provider what types of exercise are appropriate for you. If you need a cane or walker, use it as recommended by your health care provider. Wear supportive shoes that have nonskid soles. Safety  Remove any tripping hazards, such as rugs, cords, and clutter. Install safety equipment such as grab bars in bathrooms and safety rails on stairs. Keep rooms and walkways   well-lit. General instructions Talk with your health care provider about your risks for falling. Tell your health care provider if: You fall. Be sure to tell your health care provider about all falls, even ones that seem minor. You feel dizzy, tiredness (fatigue), or off-balance. Take over-the-counter and prescription medicines only as told by your health care provider. These include  supplements. Eat a healthy diet and maintain a healthy weight. A healthy diet includes low-fat dairy products, low-fat (lean) meats, and fiber from whole grains, beans, and lots of fruits and vegetables. Stay current with your vaccines. Schedule regular health, dental, and eye exams. Summary Having a healthy lifestyle and getting preventive care can help to protect your health and wellness after age 69. Screening and testing are the best way to find a health problem early and help you avoid having a fall. Early diagnosis and treatment give you the best chance for managing medical conditions that are more common for people who are older than age 69. Falls are a major cause of broken bones and head injuries in people who are older than age 69. Take precautions to prevent a fall at home. Work with your health care provider to learn what changes you can make to improve your health and wellness and to prevent falls. This information is not intended to replace advice given to you by your health care provider. Make sure you discuss any questions you have with your health care provider. Document Revised: 12/22/2020 Document Reviewed: 12/22/2020 Elsevier Patient Education  2023 Elsevier Inc.  

## 2022-10-15 NOTE — Progress Notes (Signed)
Subjective:    Patient ID: Danielle Ellis, female    DOB: 1956/07/10, 67 y.o.   MRN: [id number]  Chief Complaint  Patient presents with   Medical Management of Chronic Issues   PT presents to the office today for hypertension and weight loss. She was started Ozempic 2 mg. She has lost 22 lbs. She is morbid obese with a BMI of 36 with HTN and DM.        10/15/2022    9:10 AM 07/16/2022    9:37 AM 07/12/2022    7:47 AM  Last 3 Weights  Weight (lbs) 195 lb 195 lb 198 lb 12.8 oz  Weight (kg) 88.451 kg 88.451 kg 90.175 kg     She has hx of CABG and NSTEMI. Followed by Cardiologists every 6  months.   Diabetes She presents for her follow-up diabetic visit. She has type 2 diabetes mellitus. Hypoglycemia symptoms include nervousness/anxiousness. Associated symptoms include fatigue. Pertinent negatives for diabetes include no blurred vision and no foot paresthesias. Symptoms are stable. Risk factors for coronary artery disease include dyslipidemia, diabetes mellitus, hypertension, sedentary lifestyle and post-menopausal. She is following a generally healthy diet. Her overall blood glucose range is 90-110 mg/dl. Eye exam is current.  Hypertension This is a chronic problem. The current episode started more than 1 year ago. The problem has been resolved since onset. The problem is controlled. Associated symptoms include anxiety. Pertinent negatives include no blurred vision, malaise/fatigue, peripheral edema or shortness of breath. Risk factors for coronary artery disease include dyslipidemia and sedentary lifestyle. The current treatment provides moderate improvement.  Hyperlipidemia This is a chronic problem. The current episode started more than 1 year ago. The problem is controlled. Recent lipid tests were reviewed and are normal. Exacerbating diseases include obesity. Pertinent negatives include no shortness of breath. Current antihyperlipidemic treatment includes statins. The current treatment  provides moderate improvement of lipids. Risk factors for coronary artery disease include diabetes mellitus, dyslipidemia, hypertension and a sedentary lifestyle.  Anxiety Presents for follow-up visit. Symptoms include excessive worry, irritability and nervous/anxious behavior. Patient reports no shortness of breath. Symptoms occur occasionally. The severity of symptoms is mild. The quality of sleep is good.    Depression        This is a chronic problem.  The current episode started more than 1 year ago.   The problem occurs intermittently.  Associated symptoms include fatigue and sad.  Associated symptoms include no helplessness and no hopelessness.  Past medical history includes anxiety.       Review of Systems  Constitutional:  Positive for fatigue and irritability. Negative for malaise/fatigue.  Eyes:  Negative for blurred vision.  Respiratory:  Negative for shortness of breath.   Psychiatric/Behavioral:  Positive for depression. The patient is nervous/anxious.   All other systems reviewed and are negative.      Objective:   Physical Exam Vitals reviewed.  Constitutional:      General: She is not in acute distress.    Appearance: She is well-developed. She is obese.  HENT:     Head: Normocephalic and atraumatic.     Right Ear: Tympanic membrane normal.     Left Ear: Tympanic membrane normal.  Eyes:     Pupils: Pupils are equal, round, and reactive to light.  Neck:     Thyroid: No thyromegaly.  Cardiovascular:     Rate and Rhythm: Normal rate and regular rhythm.     Heart sounds: Normal heart sounds. No murmur heard.  Pulmonary:     Effort: Pulmonary effort is normal. No respiratory distress.     Breath sounds: Normal breath sounds. No wheezing.  Abdominal:     General: Bowel sounds are normal. There is no distension.     Palpations: Abdomen is soft.     Tenderness: There is no abdominal tenderness.  Musculoskeletal:        General: No tenderness. Normal range of  motion.     Cervical back: Normal range of motion and neck supple.  Skin:    General: Skin is warm and dry.  Neurological:     Mental Status: She is alert and oriented to person, place, and time.     Cranial Nerves: No cranial nerve deficit.     Deep Tendon Reflexes: Reflexes are normal and symmetric.  Psychiatric:        Behavior: Behavior normal.        Thought Content: Thought content normal.        Judgment: Judgment normal.       BP 131/72   Pulse (!) 59   Temp 97.7 F (36.5 C) (Temporal)   Ht 5' 1.5" (1.562 m)   Wt 195 lb (88.5 kg)   SpO2 99%   BMI 36.25 kg/m      Assessment & Plan:  Danielle Ellis comes in today with chief complaint of Medical Management of Chronic Issues   Diagnosis and orders addressed:  1. Hypertension, unspecified type - amLODipine (NORVASC) 5 MG tablet; Take 1 tablet (5 mg total) by mouth daily.  Dispense: 90 tablet; Refill: 2 - CMP14+EGFR  2. GAD (generalized anxiety disorder) - escitalopram (LEXAPRO) 10 MG tablet; Take 1 tablet (10 mg total) by mouth daily.  Dispense: 90 tablet; Refill: 2 - CMP14+EGFR  3. Depression, major, single episode, mild (HCC) - escitalopram (LEXAPRO) 10 MG tablet; Take 1 tablet (10 mg total) by mouth daily.  Dispense: 90 tablet; Refill: 2 - CMP14+EGFR  4. Morbid obesity ([place]) - CMP14+EGFR  5. History of non-ST elevation myocardial infarction (NSTEMI) - CMP14+EGFR  6. Type 2 diabetes mellitus with other specified complication, without long-term current use of insulin (HCC) - CMP14+EGFR - Bayer DCA Hb A1c Waived  7. Mixed hyperlipidemia - CMP14+EGFR   Labs pending Health Maintenance reviewed Diet and exercise encouraged  Follow up plan: 4 months   Danielle Dun, FNP

## 2022-10-18 ENCOUNTER — Other Ambulatory Visit: Payer: Self-pay | Admitting: Family

## 2022-10-18 DIAGNOSIS — R748 Abnormal levels of other serum enzymes: Secondary | ICD-10-CM

## 2022-10-28 NOTE — Progress Notes (Signed)
Cardiology Office Note   Date:  11/01/2022   ID:  TEARZA SOUTH, DOB 06/03/1956, MRN [id number]  PCP:  [name], FNP  Cardiologist:   Itali Mckendry Martinique, MD   Chief Complaint  Patient presents with   Coronary Artery Disease       History of Present Illness: Danielle Ellis is a 67 y.o. female who presents for follow up CAD. She has a a PMH of hypertension, coronary artery disease status post NSTEMI 10/09/2020, type 2 diabetes, hyperlipidemia, generalized anxiety disorder, CABG x5 on 10/14/2020 (LIMA-LAD, SVG-diagonal, SVG-OM1/OM2, and SVG-PDA)   She was admitted to the hospital on 10/10/2020.  She reported several week history of recurrent episodes of substernal chest pain that would radiate to her left shoulder and left hand.  She also noted some associated shortness of breath diaphoresis and nausea.  She was noted to have mildly elevated troponins and her EKG showed diffuse ST depressions  with new T wave inversions inferiorly.  Echocardiogram showed normal LVEF with no significant valvular abnormalities.  She underwent cardiac catheterization which showed severe multivessel disease.  She underwent CABG x5 on 10/14/2020.   She maintained sinus rhythm.  Due to her NSTEMI she was placed on Plavix and her aspirin was decreased to 81 mg daily.   She was discharged in stable condition on 10/19/2020. When seen in March it was noted she had discontinued CPAP therapy several years before and sleep study was ordered. This was performed and BIPAP was recommended with follow up in sleep clinic.   She was seen back in September and was on BIPAP with significant improvement in symptoms. Was referred to lipid clinic since not at goal on maximally tolerated statin. PCSK 9 inhibitor was started. Tolerating this well.   She states she feels great. No chest pain or dyspnea. No palpitations, dizziness or edema. Tolerating medication well. BP is well controlled.  Last A1c 6%.  She is very active and still  working daily in [employer] and doing yard work. She was noted to have some elevation of her LFTs. Abdominal US showed no definite stones but possible sludge. No abdominal complaints.      Past Medical History:  Diagnosis Date   Anxiety    Hypertension    Sleep apnea     Past Surgical History:  Procedure Laterality Date   APPENDECTOMY     CORONARY ARTERY BYPASS GRAFT N/A 10/14/2020   Procedure: CORONARY ARTERY BYPASS GRAFTING (CABG) X FIVE, USING LEFT INTERNAL MAMMARY ARTERY AND BILATERAL LEGS GREATER SAPHENOUS VEINS HARVESTED ENDOSCOPICALLY;  Surgeon: Gaye Pollack, MD;  Location: [facility];  Service: Open Heart Surgery;  Laterality: N/A;   L ankle surgery     LEFT HEART CATH AND CORONARY ANGIOGRAPHY N/A 10/10/2020   Procedure: LEFT HEART CATH AND CORONARY ANGIOGRAPHY;  Surgeon: Martinique, Asaad Gulley M, MD;  Location: [facility];  Service: Cardiovascular;  Laterality: N/A;   TEE WITHOUT CARDIOVERSION N/A 10/14/2020   Procedure: TRANSESOPHAGEAL ECHOCARDIOGRAM (TEE);  Surgeon: Gaye Pollack, MD;  Location: [place];  Service: Open Heart Surgery;  Laterality: N/A;     Current Outpatient Medications  Medication Sig Dispense Refill   acetaminophen (TYLENOL) 325 MG tablet Take 650 mg by mouth every 6 (six) hours as needed.     amLODipine (NORVASC) 5 MG tablet Take 1 tablet (5 mg total) by mouth daily. 90 tablet 2   aspirin EC 81 MG tablet Take 1 tablet (81 mg total) by mouth daily.  Swallow whole. 90 tablet 3   blood glucose meter kit and supplies Dispense based on patient and insurance preference. Use up to four times daily as directed. (FOR ICD-10 E10.9, E11.9). 1 each 0   calcium gluconate 500 MG tablet Take 500 mg by mouth daily.     escitalopram (LEXAPRO) 10 MG tablet Take 1 tablet (10 mg total) by mouth daily. 90 tablet 2   Evolocumab (REPATHA SURECLICK) [id number] MG/ML SOAJ INJECT 140 MG INTO THE SKIN EVERY 14 (FOURTEEN) DAYS. 6 mL 3   fluticasone (FLONASE) 50 MCG/ACT nasal spray Place 1  spray into both nostrils daily. 48 g 1   glucose blood (ONETOUCH ULTRA) test strip TEST BLOOD SUGAR 4 TIMES  DAILY 400 strip 3   Lancets (ONETOUCH ULTRASOFT) lancets TEST BLOOD SUGAR 4 TIMES  DAILY 400 each 3   latanoprost (XALATAN) 0.005 % ophthalmic solution Place 1 drop into both eyes daily 2.5 mL 6   loratadine (CLARITIN) 10 MG tablet Take 10 mg by mouth daily.     losartan (COZAAR) 100 MG tablet Take 1 tablet  by mouth daily. 90 tablet 3   metoprolol succinate (TOPROL-XL) 100 MG 24 hr tablet Take 1 tablet (100 mg total) by mouth daily. Take with or immediately following a meal. 90 tablet 3   Multiple Vitamin (MULTIVITAMIN) capsule Take 1 capsule by mouth daily.     rosuvastatin (CRESTOR) 10 MG tablet Take 1 tablet by mouth daily. 90 tablet 3   Semaglutide, 2 MG/DOSE, 8 MG/3ML SOPN Inject 2 mg as directed once a week. 3 mL 0   No current facility-administered medications for this visit.    Allergies:   Patient has no known allergies.    Social History:  The patient  reports that she has never smoked. She has never used smokeless tobacco. She reports that she does not drink alcohol and does not use drugs.   Family History:  The patient's family history includes Cancer in her mother; Diabetes in her mother; Heart attack in her mother; Kidney disease in her mother.    ROS:  Please see the history of present illness.   Otherwise, review of systems are positive for none.   All other systems are reviewed and negative.    PHYSICAL EXAM: VS:  BP 124/78   Pulse 71   Ht 5' 1.5" (1.562 m)   Wt 193 lb 6.4 oz (87.7 kg)   SpO2 97%   BMI 35.95 kg/m  , BMI Body mass index is 35.95 kg/m. GEN: Well nourished, obese, in no acute distress  HEENT: normal  Neck: no JVD, carotid bruits, or masses Cardiac: RRR; no murmurs, rubs, or gallops,no edema. Sternal incision has healed well.  Respiratory:  clear to auscultation bilaterally, normal work of breathing GI: soft, nontender, nondistended, +  BS MS: no deformity or atrophy  Skin: warm and dry, no rash Neuro:  Strength and sensation are intact Psych: euthymic mood, full affect   EKG:  EKG is not ordered today.     Recent Labs: 02/11/2022: Hemoglobin 13.4; Platelets 167; TSH 2.220 10/15/2022: ALT 93; BUN 15; Creatinine, Ser 0.94; Potassium 4.2; Sodium 142    Lipid Panel    Component Value Date/Time   CHOL 97 (L) 02/11/2022 1030   TRIG 188 (H) 02/11/2022 1030   HDL 44 02/11/2022 1030   CHOLHDL 2.2 02/11/2022 1030   CHOLHDL 8.2 10/10/2020 0144   VLDL 57 (H) 10/10/2020 0144   LDLCALC 23 02/11/2022 1030  Wt Readings from Last 3 Encounters:  11/01/22 193 lb 6.4 oz (87.7 kg)  10/15/22 195 lb (88.5 kg)  07/16/22 195 lb (88.5 kg)      Other studies Reviewed: Additional studies/ records that were reviewed today include: sleep study as noted in chart.    ASSESSMENT AND PLAN:  1.  Coronary artery disease-presented with NSTEMI. Found to have multivessel CAD. Status post CABG x5 October 14, 2020 Continue ASA, Toprol, statin.  Heart healthy low Increase physical activity slowly Encourage weight loss.    2. Essential hypertension-BP is well controlled.  Heart healthy low-sodium diet On losartan and Toprol XL.  On Amlodipine.     3. Hyperlipidemia- intolerant to lipitor and pravastatin in the past. On low dose  Crestor 10 mg daily.  Now on Repatha.  Excellent response with LDL down to 23.I have recommended reducing Crestor to 5 mg daily given mild elevation of LFTs.    4. OSA/snoring/daytime somnolence -abnormal sleep study. Now on BIPAP. Follow up with Dr Claiborne Billings.   5. Type 2 diabetes-A1c 6.0%.  Continue Metformin/ Ozempic Heart healthy low-sodium carb modified diet Per primary care.       Disposition:   FU with me  in 6 months  Signed, Kealohilani Maiorino Martinique, MD  11/01/2022 8:01 AM    [facility] [address], [place], [place], [address] Phone [phone], Fax [phone]

## 2022-10-29 ENCOUNTER — Ambulatory Visit (HOSPITAL_COMMUNITY)
Admission: RE | Admit: 2022-10-29 | Discharge: 2022-10-29 | Disposition: A | Discharge status: Home / Self Care | Payer: PPO | Source: Ambulatory Visit | Attending: Family | Admitting: Family

## 2022-10-29 DIAGNOSIS — R748 Abnormal levels of other serum enzymes: Secondary | ICD-10-CM | POA: Diagnosis not present

## 2022-10-29 DIAGNOSIS — R945 Abnormal results of liver function studies: Secondary | ICD-10-CM | POA: Diagnosis not present

## 2022-10-30 ENCOUNTER — Other Ambulatory Visit: Payer: Self-pay

## 2022-11-01 ENCOUNTER — Ambulatory Visit: Payer: PPO | Attending: Cardiology | Admitting: Cardiology

## 2022-11-01 ENCOUNTER — Other Ambulatory Visit: Payer: Self-pay

## 2022-11-01 ENCOUNTER — Encounter: Payer: Self-pay | Admitting: Cardiology

## 2022-11-01 VITALS — BP 124/78 | HR 71 | Ht 61.5 in | Wt 193.4 lb

## 2022-11-01 DIAGNOSIS — E119 Type 2 diabetes mellitus without complications: Secondary | ICD-10-CM

## 2022-11-01 DIAGNOSIS — Z951 Presence of aortocoronary bypass graft: Secondary | ICD-10-CM | POA: Diagnosis not present

## 2022-11-01 DIAGNOSIS — G4733 Obstructive sleep apnea (adult) (pediatric): Secondary | ICD-10-CM

## 2022-11-01 DIAGNOSIS — I10 Essential (primary) hypertension: Secondary | ICD-10-CM

## 2022-11-01 DIAGNOSIS — I2510 Atherosclerotic heart disease of native coronary artery without angina pectoris: Secondary | ICD-10-CM

## 2022-11-01 DIAGNOSIS — E782 Mixed hyperlipidemia: Secondary | ICD-10-CM

## 2022-11-01 MED ORDER — METOPROLOL SUCCINATE ER 100 MG PO TB24
100.0000 mg | ORAL_TABLET | Freq: Every day | ORAL | 3 refills | Status: DC
  Filled 2023-06-01: qty 90, 90d supply, fill #0
  Filled 2023-08-19: qty 90, 90d supply, fill #1

## 2022-11-01 MED ORDER — ROSUVASTATIN CALCIUM 10 MG PO TABS: 5.0000 mg | ORAL_TABLET | Freq: Every day | ORAL | 3 refills | Status: DC

## 2022-11-01 NOTE — Patient Instructions (Signed)
Medication Instructions:  Decrease Crestor to 5 mg daily Continue all other medications *If you need a refill on your cardiac medications before your next appointment, please call your pharmacy*   Lab Work: None ordered   Testing/Procedures: None ordered   Follow-Up: At [facility], you and your health needs are our priority.  As part of our continuing mission to provide you with exceptional heart care, we have created designated Provider Care Teams.  These Care Teams include your primary Cardiologist (physician) and Advanced Practice Providers (APPs -  Physician Assistants and Nurse Practitioners) who all work together to provide you with the care you need, when you need it.  We recommend signing up for the patient portal called "MyChart".  Sign up information is provided on this After Visit Summary.  MyChart is used to connect with patients for Virtual Visits (Telemedicine).  Patients are able to view lab/test results, encounter notes, upcoming appointments, etc.  Non-urgent messages can be sent to your provider as well.   To learn more about what you can do with MyChart, go to [email or web address].    Your next appointment:  6  months    Call in June to schedule Oct appointment     Provider:  Dr.Jordan

## 2022-11-07 ENCOUNTER — Other Ambulatory Visit: Payer: Self-pay

## 2022-11-08 ENCOUNTER — Other Ambulatory Visit: Payer: Self-pay

## 2022-11-12 ENCOUNTER — Other Ambulatory Visit: Payer: Self-pay | Admitting: Cardiology

## 2022-11-12 ENCOUNTER — Other Ambulatory Visit (HOSPITAL_COMMUNITY): Payer: Self-pay

## 2022-11-15 ENCOUNTER — Other Ambulatory Visit: Payer: Self-pay

## 2022-11-15 ENCOUNTER — Other Ambulatory Visit (HOSPITAL_COMMUNITY): Payer: Self-pay

## 2022-11-15 ENCOUNTER — Other Ambulatory Visit: Payer: Self-pay | Admitting: Cardiology

## 2022-11-15 MED ORDER — METOPROLOL SUCCINATE ER 100 MG PO TB24
100.0000 mg | ORAL_TABLET | Freq: Every day | ORAL | 1 refills | Status: DC
  Filled 2022-11-15: qty 90, 90d supply, fill #0
  Filled 2023-02-05: qty 90, 90d supply, fill #1

## 2022-11-16 DIAGNOSIS — G4733 Obstructive sleep apnea (adult) (pediatric): Secondary | ICD-10-CM | POA: Diagnosis not present

## 2022-12-07 ENCOUNTER — Other Ambulatory Visit: Payer: Self-pay

## 2022-12-09 ENCOUNTER — Other Ambulatory Visit (HOSPITAL_COMMUNITY): Payer: Self-pay

## 2022-12-13 ENCOUNTER — Ambulatory Visit (INDEPENDENT_AMBULATORY_CARE_PROVIDER_SITE_OTHER): Payer: PPO

## 2022-12-13 VITALS — Ht 61.0 in | Wt 189.0 lb

## 2022-12-13 DIAGNOSIS — Z0000 Encounter for general adult medical examination without abnormal findings: Secondary | ICD-10-CM | POA: Diagnosis not present

## 2022-12-13 NOTE — Patient Instructions (Signed)
Danielle Ellis , Thank you for taking time to come for your Medicare Wellness Visit. I appreciate your ongoing commitment to your health goals. Please review the following plan we discussed and let me know if I can assist you in the future.   These are the goals we discussed:  Goals       Exercise 3x per week (30 min per time)      Continue walking for 25 minutes twice per day. Work on losing 2-4 lbs per month until you get to your goal weight. Goals Addressed             This Visit's Progress    Exercise 3x per week (30 min per time)       Continue walking for 25 minutes twice per day. Work on losing 2-4 lbs per month until you get to your goal weight.            T2DM PHARMD GOAL (pt-stated)      Current Barriers:  Unable to independently afford treatment regimen Unable to maintain control of T2DM  Pharmacist Clinical Goal(s):  patient will verbalize ability to afford treatment regimen maintain control of T2DM as evidenced by GOAL A1C  through collaboration with PharmD and provider.   Interventions: 1:1 collaboration with Junie Spencer, FNP regarding development and update of comprehensive plan of care as evidenced by provider attestation and co-signature Inter-disciplinary care team collaboration (see longitudinal plan of care) Comprehensive medication review performed; medication list updated in electronic medical record  Diabetes: Goal on Track (progressing): YES. controlled; current treatment:OZEMPIC 2MG ;  Patient is currently controlled, however she is now having difficulty paying for ozempic in the coverage gap Metformin d/c'd Denies personal and family history of Medullary thyroid cancer (MTC) Patient reports 30lb weight loss A1c 5.9%, GFR 73 Current glucose readings: fasting glucose: <130, post prandial glucose: n/a Denies hypoglycemic/hyperglycemic symptoms Current exercise: encouraged Assessed patient finances. Enrolled in novo nordisk PAP; healthwell grant  for repatha via heart care  Hypertension -improving--cards added new amlodipine; patient reports BP averages around 130/80s  Patient Goals/Self-Care Activities patient will:  - take medications as prescribed as evidenced by patient report and record review check glucose DAILY-FASTING OR IF SYMPTOMATIC, document, and provide at future appointments collaborate with provider on medication access solutions target a minimum of 150 minutes of moderate intensity exercise weekly engage in dietary modifications by FOLLOWING A HEART HEALTHY DIET/HEALTHY PLATE METHOD          This is a list of the screening recommended for you and due dates:  Health Maintenance  Topic Date Due   DTaP/Tdap/Td vaccine (1 - Tdap) Never done   COVID-19 Vaccine (4 - 2023-24 season) 04/16/2022   Zoster (Shingles) Vaccine (1 of 2) 01/15/2023*   Yearly kidney health urinalysis for diabetes  02/12/2023   Complete foot exam   02/12/2023   Mammogram  02/26/2023   Flu Shot  03/17/2023   Eye exam for diabetics  04/07/2023   Hemoglobin A1C  04/17/2023   DEXA scan (bone density measurement)  07/01/2023   Yearly kidney function blood test for diabetes  10/15/2023   Medicare Annual Wellness Visit  12/13/2023   Colon Cancer Screening  07/29/2025   Pneumonia Vaccine  Completed   Hepatitis C Screening: USPSTF Recommendation to screen - Ages 65-79 yo.  Completed   HPV Vaccine  Aged Out  *Topic was postponed. The date shown is not the original due date.    Advanced directives: Advance directive discussed with  you today. I have provided a copy for you to complete at home and have notarized. Once this is complete please bring a copy in to our office so we can scan it into your chart.   Conditions/risks identified: Aim for 30 minutes of exercise or brisk walking, 6-8 glasses of water, and 5 servings of fruits and vegetables each day.   Next appointment: Follow up in one year for your annual wellness visit    Preventive  Care 65 Years and Older, Female Preventive care refers to lifestyle choices and visits with your health care provider that can promote health and wellness. What does preventive care include? A yearly physical exam. This is also called an annual well check. Dental exams once or twice a year. Routine eye exams. Ask your health care provider how often you should have your eyes checked. Personal lifestyle choices, including: Daily care of your teeth and gums. Regular physical activity. Eating a healthy diet. Avoiding tobacco and drug use. Limiting alcohol use. Practicing safe sex. Taking low-dose aspirin every day. Taking vitamin and mineral supplements as recommended by your health care provider. What happens during an annual well check? The services and screenings done by your health care provider during your annual well check will depend on your age, overall health, lifestyle risk factors, and family history of disease. Counseling  Your health care provider may ask you questions about your: Alcohol use. Tobacco use. Drug use. Emotional well-being. Home and relationship well-being. Sexual activity. Eating habits. History of falls. Memory and ability to understand (cognition). Work and work Astronomer. Reproductive health. Screening  You may have the following tests or measurements: Height, weight, and BMI. Blood pressure. Lipid and cholesterol levels. These may be checked every 5 years, or more frequently if you are over 63 years old. Skin check. Lung cancer screening. You may have this screening every year starting at age 45 if you have a 30-pack-year history of smoking and currently smoke or have quit within the past 15 years. Fecal occult blood test (FOBT) of the stool. You may have this test every year starting at age 73. Flexible sigmoidoscopy or colonoscopy. You may have a sigmoidoscopy every 5 years or a colonoscopy every 10 years starting at age 8. Hepatitis C blood  test. Hepatitis B blood test. Sexually transmitted disease (STD) testing. Diabetes screening. This is done by checking your blood sugar (glucose) after you have not eaten for a while (fasting). You may have this done every 1-3 years. Bone density scan. This is done to screen for osteoporosis. You may have this done starting at age 68. Mammogram. This may be done every 1-2 years. Talk to your health care provider about how often you should have regular mammograms. Talk with your health care provider about your test results, treatment options, and if necessary, the need for more tests. Vaccines  Your health care provider may recommend certain vaccines, such as: Influenza vaccine. This is recommended every year. Tetanus, diphtheria, and acellular pertussis (Tdap, Td) vaccine. You may need a Td booster every 10 years. Zoster vaccine. You may need this after age 34. Pneumococcal 13-valent conjugate (PCV13) vaccine. One dose is recommended after age 85. Pneumococcal polysaccharide (PPSV23) vaccine. One dose is recommended after age 72. Talk to your health care provider about which screenings and vaccines you need and how often you need them. This information is not intended to replace advice given to you by your health care provider. Make sure you discuss any questions you have with  your health care provider. Document Released: 08/29/2015 Document Revised: 04/21/2016 Document Reviewed: 06/03/2015 Elsevier Interactive Patient Education  2017 [employer].  Fall Prevention in the Home Falls can cause injuries. They can happen to people of all ages. There are many things you can do to make your home safe and to help prevent falls. What can I do on the outside of my home? Regularly fix the edges of walkways and driveways and fix any cracks. Remove anything that might make you trip as you walk through a door, such as a raised step or threshold. Trim any bushes or trees on the path to your home. Use  bright outdoor lighting. Clear any walking paths of anything that might make someone trip, such as rocks or tools. Regularly check to see if handrails are loose or broken. Make sure that both sides of any steps have handrails. Any raised decks and porches should have guardrails on the edges. Have any leaves, snow, or ice cleared regularly. Use sand or salt on walking paths during winter. Clean up any spills in your garage right away. This includes oil or grease spills. What can I do in the bathroom? Use night lights. Install grab bars by the toilet and in the tub and shower. Do not use towel bars as grab bars. Use non-skid mats or decals in the tub or shower. If you need to sit down in the shower, use a plastic, non-slip stool. Keep the floor dry. Clean up any water that spills on the floor as soon as it happens. Remove soap buildup in the tub or shower regularly. Attach bath mats securely with double-sided non-slip rug tape. Do not have throw rugs and other things on the floor that can make you trip. What can I do in the bedroom? Use night lights. Make sure that you have a light by your bed that is easy to reach. Do not use any sheets or blankets that are too big for your bed. They should not hang down onto the floor. Have a firm chair that has side arms. You can use this for support while you get dressed. Do not have throw rugs and other things on the floor that can make you trip. What can I do in the kitchen? Clean up any spills right away. Avoid walking on wet floors. Keep items that you use a lot in easy-to-reach places. If you need to reach something above you, use a strong step stool that has a grab bar. Keep electrical cords out of the way. Do not use floor polish or wax that makes floors slippery. If you must use wax, use non-skid floor wax. Do not have throw rugs and other things on the floor that can make you trip. What can I do with my stairs? Do not leave any items on the  stairs. Make sure that there are handrails on both sides of the stairs and use them. Fix handrails that are broken or loose. Make sure that handrails are as long as the stairways. Check any carpeting to make sure that it is firmly attached to the stairs. Fix any carpet that is loose or worn. Avoid having throw rugs at the top or bottom of the stairs. If you do have throw rugs, attach them to the floor with carpet tape. Make sure that you have a light switch at the top of the stairs and the bottom of the stairs. If you do not have them, ask someone to add them for you. What else  can I do to help prevent falls? Wear shoes that: Do not have high heels. Have rubber bottoms. Are comfortable and fit you well. Are closed at the toe. Do not wear sandals. If you use a stepladder: Make sure that it is fully opened. Do not climb a closed stepladder. Make sure that both sides of the stepladder are locked into place. Ask someone to hold it for you, if possible. Clearly mark and make sure that you can see: Any grab bars or handrails. First and last steps. Where the edge of each step is. Use tools that help you move around (mobility aids) if they are needed. These include: Canes. Walkers. Scooters. Crutches. Turn on the lights when you go into a dark area. Replace any light bulbs as soon as they burn out. Set up your furniture so you have a clear path. Avoid moving your furniture around. If any of your floors are uneven, fix them. If there are any pets around you, be aware of where they are. Review your medicines with your doctor. Some medicines can make you feel dizzy. This can increase your chance of falling. Ask your doctor what other things that you can do to help prevent falls. This information is not intended to replace advice given to you by your health care provider. Make sure you discuss any questions you have with your health care provider. Document Released: 05/29/2009 Document Revised:  01/08/2016 Document Reviewed: 09/06/2014 Elsevier Interactive Patient Education  2017 [employer].

## 2022-12-13 NOTE — Progress Notes (Signed)
Subjective:   Danielle Ellis is a 67 y.o. female who presents for Medicare Annual (Subsequent) preventive examination. I connected with  Danielle Ellis on 12/13/22 by a audio enabled telemedicine application and verified that I am speaking with the correct person using two identifiers.  Patient Location: Home  Provider Location: Home Office  I discussed the limitations of evaluation and management by telemedicine. The patient expressed understanding and agreed to proceed.  Review of Systems     Cardiac Risk Factors include: advanced age (>42men, >58 women);diabetes mellitus;dyslipidemia     Objective:    Today's Vitals   12/13/22 0818  Weight: 189 lb (85.7 kg)  Height: 5\' 1"  (1.549 m)   Body mass index is 35.71 kg/m.     12/13/2022    8:21 AM 12/09/2021   10:36 AM 12/08/2020   10:59 AM 10/10/2020   12:48 AM 10/09/2020    6:38 PM 12/07/2019   10:39 AM  Advanced Directives  Does Patient Have a Medical Advance Directive? No No No No No No  Would patient like information on creating a medical advance directive? No - Patient declined No - Patient declined Yes (MAU/Ambulatory/Procedural Areas - Information given) No - Patient declined  Yes (Inpatient - patient defers creating a medical advance directive at this time - Information given)    Current Medications (verified) Outpatient Encounter Medications as of 12/13/2022  Medication Sig   acetaminophen (TYLENOL) 325 MG tablet Take 650 mg by mouth every 6 (six) hours as needed.   amLODipine (NORVASC) 5 MG tablet Take 1 tablet (5 mg total) by mouth daily.   aspirin EC 81 MG tablet Take 1 tablet (81 mg total) by mouth daily. Swallow whole.   blood glucose meter kit and supplies Dispense based on patient and insurance preference. Use up to four times daily as directed. (FOR ICD-10 E10.9, E11.9).   calcium gluconate 500 MG tablet Take 500 mg by mouth daily.   escitalopram (LEXAPRO) 10 MG tablet Take 1 tablet (10 mg total) by mouth  daily.   Evolocumab (REPATHA SURECLICK) 140 MG/ML SOAJ INJECT 140 MG INTO THE SKIN EVERY 14 (FOURTEEN) DAYS.   fluticasone (FLONASE) 50 MCG/ACT nasal spray Place 1 spray into both nostrils daily.   glucose blood (ONETOUCH ULTRA) test strip TEST BLOOD SUGAR 4 TIMES  DAILY   Lancets (ONETOUCH ULTRASOFT) lancets TEST BLOOD SUGAR 4 TIMES  DAILY   latanoprost (XALATAN) 0.005 % ophthalmic solution Place 1 drop into both eyes daily   loratadine (CLARITIN) 10 MG tablet Take 10 mg by mouth daily.   losartan (COZAAR) 100 MG tablet Take 1 tablet (100 mg total) by mouth daily.   metoprolol succinate (TOPROL-XL) 100 MG 24 hr tablet Take 1 tablet (100 mg total) by mouth daily. Take with or immediately following a meal.   metoprolol succinate (TOPROL-XL) 100 MG 24 hr tablet Take 1 tablet (100 mg total) by mouth daily. Take with or immediately following a meal.   Multiple Vitamin (MULTIVITAMIN) capsule Take 1 capsule by mouth daily.   rosuvastatin (CRESTOR) 10 MG tablet Take 0.5 tablets (5 mg total) by mouth daily.   Semaglutide, 2 MG/DOSE, 8 MG/3ML SOPN Inject 2 mg as directed once a week.   No facility-administered encounter medications on file as of 12/13/2022.    Allergies (verified) Patient has no known allergies.   History: Past Medical History:  Diagnosis Date   Anxiety    Hypertension    Sleep apnea    Past Surgical History:  Procedure Laterality Date   APPENDECTOMY     CORONARY ARTERY BYPASS GRAFT N/A 10/14/2020   Procedure: CORONARY ARTERY BYPASS GRAFTING (CABG) X FIVE, USING LEFT INTERNAL MAMMARY ARTERY AND BILATERAL LEGS GREATER SAPHENOUS VEINS HARVESTED ENDOSCOPICALLY;  Surgeon: Alleen Borne, MD;  Location: [facility] OR;  Service: Open Heart Surgery;  Laterality: N/A;   L ankle surgery     LEFT HEART CATH AND CORONARY ANGIOGRAPHY N/A 10/10/2020   Procedure: LEFT HEART CATH AND CORONARY ANGIOGRAPHY;  Surgeon: Swaziland, Peter M, MD;  Location: [facility] INVASIVE CV LAB;  Service: Cardiovascular;   Laterality: N/A;   TEE WITHOUT CARDIOVERSION N/A 10/14/2020   Procedure: TRANSESOPHAGEAL ECHOCARDIOGRAM (TEE);  Surgeon: Alleen Borne, MD;  Location: [facility] OR;  Service: Open Heart Surgery;  Laterality: N/A;   Family History  Problem Relation Age of Onset   Diabetes Mother    Kidney disease Mother    Cancer Mother    Heart attack Mother    Social History   Socioeconomic History   Marital status: Married    Spouse name: Not on file   Number of children: 1   Years of education: Not on file   Highest education level: Not on file  Occupational History   Occupation: part time Therapist, sports    Comment: retired  Tobacco Use   Smoking status: Never   Smokeless tobacco: Never  Building services engineer Use: Never used  Substance and Sexual Activity   Alcohol use: No   Drug use: No   Sexual activity: Yes    Partners: Male  Other Topics Concern   Not on file  Social History Narrative   Lives with her husband. They have one daughter, Danielle Ellis - She lives 10 minutes away.   Social Determinants of Health   Financial Resource Strain: Low Risk  (12/13/2022)   Overall Financial Resource Strain (CARDIA)    Difficulty of Paying Living Expenses: Not hard at all  Food Insecurity: No Food Insecurity (12/13/2022)   Hunger Vital Sign    Worried About Running Out of Food in the Last Year: Never true    Ran Out of Food in the Last Year: Never true  Transportation Needs: No Transportation Needs (12/13/2022)   PRAPARE - Administrator, Civil Service (Medical): No    Lack of Transportation (Non-Medical): No  Physical Activity: Sufficiently Active (12/13/2022)   Exercise Vital Sign    Days of Exercise per Week: 5 days    Minutes of Exercise per Session: 30 min  Stress: No Stress Concern Present (12/13/2022)   [employer] of Occupational Health - Occupational Stress Questionnaire    Feeling of Stress : Not at all  Social Connections: Moderately Integrated (12/13/2022)   Social  Connection and Isolation Panel [NHANES]    Frequency of Communication with Friends and Family: More than three times a week    Frequency of Social Gatherings with Friends and Family: More than three times a week    Attends Religious Services: More than 4 times per year    Active Member of [employer] or Organizations: No    Attends Engineer, structural: Never    Marital Status: Married    Tobacco Counseling Counseling given: Not Answered   Clinical Intake:  Pre-visit preparation completed: Yes  Pain : No/denies pain     Nutritional Risks: None Diabetes: Yes CBG done?: No Did pt. bring in CBG monitor from home?: No  How often do you need to have someone help  you when you read instructions, pamphlets, or other written materials from your doctor or pharmacy?: 1 - Never  Diabetic?yes Nutrition Risk Assessment:  Has the patient had any N/V/D within the last 2 months?  No  Does the patient have any non-healing wounds?  No  Has the patient had any unintentional weight loss or weight gain?  No   Diabetes:  Is the patient diabetic?  Yes  If diabetic, was a CBG obtained today?  No  Did the patient bring in their glucometer from home?  No  How often do you monitor your CBG's? 2 x day .   Financial Strains and Diabetes Management:  Are you having any financial strains with the device, your supplies or your medication? No .  Does the patient want to be seen by Chronic Care Management for management of their diabetes?  No  Would the patient like to be referred to a Nutritionist or for Diabetic Management?  No   Diabetic Exams:  Diabetic Eye Exam: Completed 03/2022 Diabetic Foot Exam: Overdue, Pt has been advised about the importance in completing this exam. Pt is scheduled for diabetic foot exam on next office visit .   Interpreter Needed?: No  Information entered by :: Renie Ora, LPN   Activities of Daily Living    12/13/2022    8:22 AM 12/10/2022    9:45 AM  In  your present state of health, do you have any difficulty performing the following activities:  Hearing? 0 0  Vision? 0 0  Difficulty concentrating or making decisions? 0 0  Walking or climbing stairs? 0 0  Dressing or bathing? 0 0  Doing errands, shopping? 0 0  Preparing Food and eating ? N N  Using the Toilet? N N  In the past six months, have you accidently leaked urine? N Y  Do you have problems with loss of bowel control? N N  Managing your Medications? N N  Managing your Finances? N N  Housekeeping or managing your Housekeeping? N N    Patient Care Team: Junie Spencer, FNP as PCP - General (Family Medicine) [place], Peter M, MD as PCP - Cardiology (Cardiology) Lennette Bihari, MD as PCP - Sleep Medicine (Cardiology) Danella Maiers, [facility] as Pharmacist (Family Medicine)  Indicate any recent Medical Services you may have received from other than [facility] providers in the past year (date may be approximate).     Assessment:   This is a routine wellness examination for [place].  Hearing/Vision screen Vision Screening - Comments:: Wears rx glasses - up to date with routine eye exams with Dr.Martin    Dietary issues and exercise activities discussed: Current Exercise Habits: Home exercise routine, Type of exercise: walking, Time (Minutes): 30, Frequency (Times/Week): 5, Weekly Exercise (Minutes/Week): 150, Intensity: Mild, Exercise limited by: None identified   Goals Addressed             This Visit's Progress    Exercise 3x per week (30 min per time)   On track    Continue walking for 25 minutes twice per day. Work on losing 2-4 lbs per month until you get to your goal weight. Goals Addressed             This Visit's Progress    Exercise 3x per week (30 min per time)       Continue walking for 25 minutes twice per day. Work on losing 2-4 lbs per month until you get to your goal weight.  Depression Screen    12/13/2022    8:21 AM 07/16/2022    9:35  AM 06/14/2022    2:37 PM 04/15/2022    8:56 AM 02/11/2022    9:54 AM 12/09/2021   10:33 AM 08/04/2021    9:10 AM  PHQ 2/9 Scores  PHQ - 2 Score 0 0 0 0 0 0 0  PHQ- 9 Score  0 0 0   0    Fall Risk    12/13/2022    8:19 AM 12/10/2022    9:45 AM 10/15/2022    9:09 AM 07/16/2022    9:35 AM 06/14/2022    2:37 PM  Fall Risk   Falls in the past year? 0 0 0 0 0  Number falls in past yr: 0      Injury with Fall? 0      Risk for fall due to : No Fall Risks      Follow up Falls prevention discussed        FALL RISK PREVENTION PERTAINING TO THE HOME:  Any stairs in or around the home? Yes  If so, are there any without handrails? No  Home free of loose throw rugs in walkways, pet beds, electrical cords, etc? Yes  Adequate lighting in your home to reduce risk of falls? Yes   ASSISTIVE DEVICES UTILIZED TO PREVENT FALLS:  Life alert? No  Use of a cane, walker or w/c? No  Grab bars in the bathroom? Yes  Shower chair or bench in shower? Yes  Elevated toilet seat or a handicapped toilet? Yes        12/13/2022    8:22 AM 12/09/2021   10:39 AM 12/07/2019   10:43 AM  6CIT Screen  What Year? 0 points 0 points 0 points  What month? 0 points 0 points 0 points  What time? 0 points 0 points 0 points  Count back from 20 0 points 0 points 0 points  Months in reverse 0 points 0 points 0 points  Repeat phrase 0 points 6 points 0 points  Total Score 0 points 6 points 0 points    Immunizations Immunization History  Administered Date(s) Administered   Fluad Quad(high Dose 65+) 05/23/2019, 10/03/2020, 06/14/2022   Influenza, High Dose Seasonal PF 07/08/2021   Moderna Sars-Covid-2 Vaccination 10/11/2019, 11/09/2019, 08/05/2020   Pneumococcal Conjugate-13 05/23/2019   Pneumococcal Polysaccharide-23 02/11/2022   Pneumococcal-Unspecified 06/14/2018    TDAP status: Due, Education has been provided regarding the importance of this vaccine. Advised may receive this vaccine at local pharmacy or Health  Dept. Aware to provide a copy of the vaccination record if obtained from local pharmacy or Health Dept. Verbalized acceptance and understanding.  Flu Vaccine status: Up to date  Pneumococcal vaccine status: Up to date  Covid-19 vaccine status: Completed vaccines  Qualifies for Shingles Vaccine? Yes   Zostavax completed No   Shingrix Completed?: No.    Education has been provided regarding the importance of this vaccine. Patient has been advised to call insurance company to determine out of pocket expense if they have not yet received this vaccine. Advised may also receive vaccine at local pharmacy or Health Dept. Verbalized acceptance and understanding.  Screening Tests Health Maintenance  Topic Date Due   DTaP/Tdap/Td (1 - Tdap) Never done   COVID-19 Vaccine (4 - 2023-24 season) 04/16/2022   Zoster Vaccines- Shingrix (1 of 2) 01/15/2023 (Originally 01/03/2004)   Diabetic kidney evaluation - Urine ACR  02/12/2023   FOOT EXAM  02/12/2023  MAMMOGRAM  02/26/2023   INFLUENZA VACCINE  03/17/2023   OPHTHALMOLOGY EXAM  04/07/2023   HEMOGLOBIN A1C  04/17/2023   DEXA SCAN  07/01/2023   Diabetic kidney evaluation - eGFR measurement  10/15/2023   Medicare Annual Wellness (AWV)  12/13/2023   COLONOSCOPY (Pts 45-70yrs Insurance coverage will need to be confirmed)  07/29/2025   Pneumonia Vaccine 73+ Years old  Completed   Hepatitis C Screening  Completed   HPV VACCINES  Aged Out    Health Maintenance  Health Maintenance Due  Topic Date Due   DTaP/Tdap/Td (1 - Tdap) Never done   COVID-19 Vaccine (4 - 2023-24 season) 04/16/2022    Colorectal cancer screening: Type of screening: Colonoscopy. Completed 12/014/2016. Repeat every 10 years  Mammogram status: Completed 02/25/2022. Repeat every year  Bone Density status: Completed 06/30/2018. Results reflect: Bone density results: OSTEOPENIA. Repeat every 5 years.  Lung Cancer Screening: (Low Dose CT Chest recommended if Age 35-80 years, 30  pack-year currently smoking OR have quit w/in 15years.) does not qualify.   Lung Cancer Screening Referral: n/a  Additional Screening:  Hepatitis C Screening: does not qualify; Completed n/a  Vision Screening: Recommended annual ophthalmology exams for early detection of glaucoma and other disorders of the eye. Is the patient up to date with their annual eye exam?  No  Who is the provider or what is the name of the office in which the patient attends annual eye exams? Dr.Martin  If pt is not established with a provider, would they like to be referred to a provider to establish care? No .   Dental Screening: Recommended annual dental exams for proper oral hygiene  Community Resource Referral / Chronic Care Management: CRR required this visit?  No   CCM required this visit?  No      Plan:     I have personally reviewed and noted the following in the patient's chart:   Medical and social history Use of alcohol, tobacco or illicit drugs  Current medications and supplements including opioid prescriptions. Patient is not currently taking opioid prescriptions. Functional ability and status Nutritional status Physical activity Advanced directives List of other physicians Hospitalizations, surgeries, and ER visits in previous 12 months Vitals Screenings to include cognitive, depression, and falls Referrals and appointments  In addition, I have reviewed and discussed with patient certain preventive protocols, quality metrics, and best practice recommendations. A written personalized care plan for preventive services as well as general preventive health recommendations were provided to patient.     Lorrene Reid, LPN   1/61/0960   Nurse Notes: Due TDAP vaccine

## 2022-12-16 ENCOUNTER — Telehealth: Payer: Self-pay

## 2022-12-16 NOTE — Telephone Encounter (Signed)
Patient informed we have received shipment of her Ozempic and it will be placed in the refrigerator for her to pick up.

## 2022-12-29 ENCOUNTER — Other Ambulatory Visit (HOSPITAL_COMMUNITY): Payer: Self-pay

## 2022-12-29 ENCOUNTER — Telehealth: Payer: Self-pay | Admitting: Cardiology

## 2022-12-29 ENCOUNTER — Other Ambulatory Visit: Payer: Self-pay

## 2022-12-29 MED ORDER — ROSUVASTATIN CALCIUM 10 MG PO TABS
5.0000 mg | ORAL_TABLET | Freq: Every day | ORAL | 3 refills | Status: DC
  Filled 2022-12-29: qty 45, 90d supply, fill #0
  Filled 2023-02-05 – 2023-03-24 (×2): qty 45, 90d supply, fill #1
  Filled 2023-06-22: qty 45, 90d supply, fill #2
  Filled 2023-09-07: qty 45, 90d supply, fill #3

## 2022-12-29 NOTE — Telephone Encounter (Signed)
Called patient to verify dosage amount for Crestor 10 mg daily or half tablet (5 mg) per Dr. Elvis Coil office note from 11/01/22. Patient confirmed that it is a 10 mg tablet to be taken as half tablet daily. Confirmed patient's pharmacy. Danielle Ellis verbalized understanding and all (if any) questions were answered. She thanked me for calling.

## 2022-12-29 NOTE — Telephone Encounter (Signed)
*  STAT* If patient is at the pharmacy, call can be transferred to refill team.   1. Which medications need to be refilled? (please list name of each medication and dose if known)  rosuvastatin (CRESTOR) 10 MG tablet   2. Which pharmacy/location (including street and city if local pharmacy) is medication to be sent to? [facility]   3. Do they need a 30 day or 90 day supply? 90

## 2023-02-04 DIAGNOSIS — G4733 Obstructive sleep apnea (adult) (pediatric): Secondary | ICD-10-CM | POA: Diagnosis not present

## 2023-02-05 ENCOUNTER — Other Ambulatory Visit: Payer: Self-pay | Admitting: Family

## 2023-02-05 ENCOUNTER — Other Ambulatory Visit (HOSPITAL_COMMUNITY): Payer: Self-pay

## 2023-02-07 ENCOUNTER — Other Ambulatory Visit (HOSPITAL_COMMUNITY): Payer: Self-pay

## 2023-02-07 ENCOUNTER — Other Ambulatory Visit: Payer: Self-pay

## 2023-02-07 MED ORDER — FLUTICASONE PROPIONATE 50 MCG/ACT NA SUSP
1.0000 | Freq: Every day | NASAL | 1 refills | Status: DC
  Filled 2023-02-07: qty 16, 60d supply, fill #0
  Filled 2023-05-16: qty 16, 60d supply, fill #1
  Filled 2023-08-27: qty 16, 60d supply, fill #2
  Filled 2023-12-04: qty 16, 60d supply, fill #3

## 2023-02-07 MED ORDER — ONETOUCH ULTRA VI STRP
ORAL_STRIP | 3 refills | Status: AC
  Filled 2023-02-07: qty 400, 100d supply, fill #0
  Filled 2023-05-16: qty 400, 100d supply, fill #1
  Filled 2023-08-27: qty 400, 100d supply, fill #2
  Filled 2023-12-04: qty 400, 100d supply, fill #3

## 2023-02-09 ENCOUNTER — Other Ambulatory Visit: Payer: Self-pay

## 2023-02-16 ENCOUNTER — Other Ambulatory Visit (HOSPITAL_COMMUNITY): Payer: Self-pay

## 2023-02-18 ENCOUNTER — Ambulatory Visit: Payer: HMO

## 2023-02-28 ENCOUNTER — Other Ambulatory Visit (HOSPITAL_COMMUNITY): Payer: Self-pay

## 2023-03-03 ENCOUNTER — Other Ambulatory Visit (HOSPITAL_COMMUNITY): Payer: Self-pay

## 2023-03-04 ENCOUNTER — Other Ambulatory Visit (HOSPITAL_COMMUNITY): Payer: Self-pay

## 2023-03-07 ENCOUNTER — Other Ambulatory Visit (HOSPITAL_COMMUNITY): Payer: Self-pay

## 2023-03-07 ENCOUNTER — Other Ambulatory Visit: Payer: Self-pay

## 2023-03-08 ENCOUNTER — Other Ambulatory Visit: Payer: Self-pay

## 2023-03-08 ENCOUNTER — Other Ambulatory Visit (HOSPITAL_COMMUNITY): Payer: Self-pay

## 2023-03-09 ENCOUNTER — Other Ambulatory Visit (HOSPITAL_COMMUNITY): Payer: Self-pay

## 2023-03-10 ENCOUNTER — Other Ambulatory Visit: Payer: Self-pay

## 2023-03-11 ENCOUNTER — Other Ambulatory Visit (HOSPITAL_COMMUNITY): Payer: Self-pay

## 2023-03-11 ENCOUNTER — Other Ambulatory Visit: Payer: Self-pay | Admitting: Cardiology

## 2023-03-11 DIAGNOSIS — Z951 Presence of aortocoronary bypass graft: Secondary | ICD-10-CM

## 2023-03-11 DIAGNOSIS — I2510 Atherosclerotic heart disease of native coronary artery without angina pectoris: Secondary | ICD-10-CM

## 2023-03-11 DIAGNOSIS — E782 Mixed hyperlipidemia: Secondary | ICD-10-CM

## 2023-03-16 ENCOUNTER — Other Ambulatory Visit: Payer: Self-pay | Admitting: Family

## 2023-03-16 DIAGNOSIS — Z1231 Encounter for screening mammogram for malignant neoplasm of breast: Secondary | ICD-10-CM

## 2023-03-17 ENCOUNTER — Other Ambulatory Visit (HOSPITAL_COMMUNITY): Payer: Self-pay

## 2023-03-17 ENCOUNTER — Other Ambulatory Visit: Payer: Self-pay

## 2023-03-17 ENCOUNTER — Encounter: Payer: Self-pay | Admitting: Family

## 2023-03-17 ENCOUNTER — Ambulatory Visit (INDEPENDENT_AMBULATORY_CARE_PROVIDER_SITE_OTHER): Payer: PPO | Admitting: Family

## 2023-03-17 ENCOUNTER — Telehealth: Payer: Self-pay | Admitting: Family

## 2023-03-17 VITALS — BP 125/71 | HR 69 | Temp 97.6°F | Ht 61.0 in | Wt 201.4 lb

## 2023-03-17 DIAGNOSIS — F411 Generalized anxiety disorder: Secondary | ICD-10-CM

## 2023-03-17 DIAGNOSIS — I252 Old myocardial infarction: Secondary | ICD-10-CM

## 2023-03-17 DIAGNOSIS — Z0000 Encounter for general adult medical examination without abnormal findings: Secondary | ICD-10-CM | POA: Diagnosis not present

## 2023-03-17 DIAGNOSIS — R1011 Right upper quadrant pain: Secondary | ICD-10-CM

## 2023-03-17 DIAGNOSIS — I10 Essential (primary) hypertension: Secondary | ICD-10-CM | POA: Diagnosis not present

## 2023-03-17 DIAGNOSIS — E782 Mixed hyperlipidemia: Secondary | ICD-10-CM

## 2023-03-17 DIAGNOSIS — I2510 Atherosclerotic heart disease of native coronary artery without angina pectoris: Secondary | ICD-10-CM

## 2023-03-17 DIAGNOSIS — E1169 Type 2 diabetes mellitus with other specified complication: Secondary | ICD-10-CM | POA: Diagnosis not present

## 2023-03-17 DIAGNOSIS — Z951 Presence of aortocoronary bypass graft: Secondary | ICD-10-CM

## 2023-03-17 DIAGNOSIS — Z0001 Encounter for general adult medical examination with abnormal findings: Secondary | ICD-10-CM

## 2023-03-17 DIAGNOSIS — F320 Major depressive disorder, single episode, mild: Secondary | ICD-10-CM | POA: Diagnosis not present

## 2023-03-17 LAB — BAYER DCA HB A1C WAIVED: HB A1C (BAYER DCA - WAIVED): 5.5 % (ref 4.8–5.6)

## 2023-03-17 MED ORDER — AMLODIPINE BESYLATE 5 MG PO TABS
5.0000 mg | ORAL_TABLET | Freq: Every day | ORAL | 2 refills | Status: DC
  Filled 2023-03-17 – 2023-06-03 (×2): qty 90, 90d supply, fill #0
  Filled 2023-09-07: qty 90, 90d supply, fill #1
  Filled 2023-12-01: qty 90, 90d supply, fill #2

## 2023-03-17 MED ORDER — SEMAGLUTIDE (2 MG/DOSE) 8 MG/3ML ~~LOC~~ SOPN
2.0000 mg | PEN_INJECTOR | SUBCUTANEOUS | 2 refills | Status: DC
Start: 2023-03-17 — End: 2024-01-17
  Filled 2023-03-17: qty 3, 28d supply, fill #0
  Filled 2023-05-16: qty 9, 84d supply, fill #0
  Filled 2023-05-31: qty 9, fill #0
  Filled 2023-08-27: qty 9, 28d supply, fill #0
  Filled 2023-09-07 – 2023-10-24 (×2): qty 9, 84d supply, fill #0

## 2023-03-17 NOTE — Progress Notes (Signed)
Subjective:    Patient ID: Danielle Ellis, female    DOB: 1955/10/27, 67 y.o.   MRN: [id number]  Chief Complaint  Patient presents with   Medical Management of Chronic Issues   Abdominal Pain    Right side Gallbladder look at last scan    Pt present to the office today CPE and chronic follow up. She is followed by Cardiologists every 6 months  for NSTEMI on 10/09/20.    She was started Ozempic 2 mg. She had lost 22 lbs, but gained 11 lbs since our last visit. She is morbid obese with a BMI of 38 with HTN and DM.       03/17/2023   10:21 AM 12/13/2022    8:18 AM 11/01/2022    7:54 AM  Last 3 Weights  Weight (lbs) 201 lb 6.4 oz 189 lb 193 lb 6.4 oz  Weight (kg) 91.354 kg 85.73 kg 87.726 kg     She is complaining of RUQ pain. She has US abdomen on 10/29/22 that showed, "1. No cholelithiasis. 2. Common bile duct measures 4.2 mm, prominent. Possible echogenic material within the common bile duct. Recommend further evaluation with MRCP." Abdominal Pain This is a new problem. The current episode started more than 1 month ago. The onset quality is gradual. The problem occurs intermittently. The pain is located in the RUQ. The pain is at a severity of 8/10. The pain is moderate. The quality of the pain is aching. Associated symptoms include belching. Pertinent negatives include no constipation, diarrhea, dysuria, fever, flatus, frequency, headaches, nausea, vomiting or weight loss. The pain is aggravated by eating and certain positions. The pain is relieved by Being still. She has tried nothing for the symptoms. The treatment provided no relief.  Hypertension This is a chronic problem. The current episode started more than 1 year ago. The problem has been resolved since onset. Associated symptoms include anxiety. Pertinent negatives include no blurred vision, headaches, malaise/fatigue, peripheral edema or shortness of breath. Risk factors for coronary artery disease include dyslipidemia,  obesity and sedentary lifestyle. The current treatment provides moderate improvement.  Diabetes She presents for her follow-up diabetic visit. She has type 2 diabetes mellitus. Hypoglycemia symptoms include nervousness/anxiousness. Pertinent negatives for hypoglycemia include no headaches. Pertinent negatives for diabetes include no blurred vision, no foot paresthesias and no weight loss. Risk factors for coronary artery disease include dyslipidemia, diabetes mellitus, hypertension, sedentary lifestyle and post-menopausal. She is following a generally unhealthy diet. Her overall blood glucose range is 90-110 mg/dl. Eye exam is current.  Hyperlipidemia This is a chronic problem. The current episode started more than 1 year ago. The problem is controlled. Recent lipid tests were reviewed and are normal. Exacerbating diseases include obesity. Pertinent negatives include no shortness of breath. Current antihyperlipidemic treatment includes statins. The current treatment provides moderate improvement of lipids. Risk factors for coronary artery disease include diabetes mellitus, dyslipidemia, a sedentary lifestyle, post-menopausal and hypertension.  Anxiety Presents for follow-up visit. Symptoms include excessive worry, nervous/anxious behavior and restlessness. Patient reports no nausea or shortness of breath. Symptoms occur most days. The severity of symptoms is moderate.    Depression        This is a chronic problem.  The current episode started more than 1 year ago.   The onset quality is gradual.   The problem occurs intermittently.  Associated symptoms include restlessness and sad.  Associated symptoms include no helplessness, no hopelessness and no headaches.  Past treatments include SSRIs -  Selective serotonin reuptake inhibitors.  Past medical history includes anxiety.       Review of Systems  Constitutional:  Negative for fever, malaise/fatigue and weight loss.  Eyes:  Negative for blurred  vision.  Respiratory:  Negative for shortness of breath.   Gastrointestinal:  Positive for abdominal pain. Negative for constipation, diarrhea, flatus, nausea and vomiting.  Genitourinary:  Negative for dysuria and frequency.  Neurological:  Negative for headaches.  Psychiatric/Behavioral:  Positive for depression. The patient is nervous/anxious.   All other systems reviewed and are negative.  Family History  Problem Relation Age of Onset   Diabetes Mother    Kidney disease Mother    Cancer Mother    Heart attack Mother    Social History   Socioeconomic History   Marital status: Married    Spouse name: Not on file   Number of children: 1   Years of education: Not on file   Highest education level: Not on file  Occupational History   Occupation: part time Therapist, sports    Comment: retired  Tobacco Use   Smoking status: Never   Smokeless tobacco: Never  Vaping Use   Vaping status: Never Used  Substance and Sexual Activity   Alcohol use: No   Drug use: No   Sexual activity: Yes    Partners: Male  Other Topics Concern   Not on file  Social History Narrative   Lives with her husband. They have one daughter, Danielle Ellis - She lives 10 minutes away.   Social Determinants of Health   Financial Resource Strain: Low Risk  (12/13/2022)   Overall Financial Resource Strain (CARDIA)    Difficulty of Paying Living Expenses: Not hard at all  Food Insecurity: No Food Insecurity (12/13/2022)   Hunger Vital Sign    Worried About Running Out of Food in the Last Year: Never true    Ran Out of Food in the Last Year: Never true  Transportation Needs: No Transportation Needs (12/13/2022)   PRAPARE - Administrator, Civil Service (Medical): No    Lack of Transportation (Non-Medical): No  Physical Activity: Sufficiently Active (12/13/2022)   Exercise Vital Sign    Days of Exercise per Week: 5 days    Minutes of Exercise per Session: 30 min  Stress: No Stress Concern Present  (12/13/2022)   [employer] of Occupational Health - Occupational Stress Questionnaire    Feeling of Stress : Not at all  Social Connections: Moderately Integrated (12/13/2022)   Social Connection and Isolation Panel [NHANES]    Frequency of Communication with Friends and Family: More than three times a week    Frequency of Social Gatherings with Friends and Family: More than three times a week    Attends Religious Services: More than 4 times per year    Active Member of [employer] or Organizations: No    Attends Banker Meetings: Never    Marital Status: Married       Objective:   Physical Exam Vitals reviewed.  Constitutional:      General: She is not in acute distress.    Appearance: She is well-developed. She is obese.  HENT:     Head: Normocephalic and atraumatic.     Right Ear: External ear normal.  Eyes:     Pupils: Pupils are equal, round, and reactive to light.  Neck:     Thyroid: No thyromegaly.  Cardiovascular:     Rate and Rhythm: Normal rate and regular rhythm.  Heart sounds: Normal heart sounds. No murmur heard. Pulmonary:     Effort: Pulmonary effort is normal. No respiratory distress.     Breath sounds: Normal breath sounds. No wheezing.  Abdominal:     General: Bowel sounds are normal. There is no distension.     Palpations: Abdomen is soft.     Tenderness: There is no abdominal tenderness.  Musculoskeletal:        General: No tenderness. Normal range of motion.     Cervical back: Normal range of motion and neck supple.  Skin:    General: Skin is warm and dry.  Neurological:     Mental Status: She is alert and oriented to person, place, and time.     Cranial Nerves: No cranial nerve deficit.     Deep Tendon Reflexes: Reflexes are normal and symmetric.  Psychiatric:        Behavior: Behavior normal.        Thought Content: Thought content normal.        Judgment: Judgment normal.        BP 125/71   Pulse 69   Temp 97.6 F (36.4 C)  (Temporal)   Ht 5\' 1"  (1.549 m)   Wt 201 lb 6.4 oz (91.4 kg)   SpO2 96%   BMI 38.05 kg/m   Assessment & Plan:  Danielle Ellis comes in today with chief complaint of Medical Management of Chronic Issues and Abdominal Pain (Right side Gallbladder look at last scan )   Diagnosis and orders addressed:  1. Hypertension, unspecified type - CBC with Differential/Platelet - CMP14+EGFR - amLODipine (NORVASC) 5 MG tablet; Take 1 tablet (5 mg total) by mouth daily.  Dispense: 90 tablet; Refill: 2  2. Annual physical exam - Microalbumin / creatinine urine ratio - CBC with Differential/Platelet - CMP14+EGFR - Lipid panel - TSH  3. Coronary artery disease involving native coronary artery of native heart without angina pectoris - CBC with Differential/Platelet - CMP14+EGFR  4. Depression, major, single episode, mild (HCC) - CBC with Differential/Platelet - CMP14+EGFR  5. GAD (generalized anxiety disorder) - CBC with Differential/Platelet - CMP14+EGFR  6. History of non-ST elevation myocardial infarction (NSTEMI) - CBC with Differential/Platelet - CMP14+EGFR  7. Mixed hyperlipidemia - CBC with Differential/Platelet - CMP14+EGFR  8. Morbid obesity (HCC) - CBC with Differential/Platelet - CMP14+EGFR  9. S/P CABG x 5 - CBC with Differential/Platelet - CMP14+EGFR  10. Type 2 diabetes mellitus with other specified complication, without long-term current use of insulin (HCC) - Microalbumin / creatinine urine ratio - CBC with Differential/Platelet - CMP14+EGFR - Semaglutide, 2 MG/DOSE, 8 MG/3ML SOPN; Inject 2 mg as directed once a week.  Dispense: 9 mL; Refill: 2 - Bayer DCA Hb A1c Waived  11. Right upper quadrant abdominal pain - CBC with Differential/Platelet - CMP14+EGFR - MR ABDOMEN WITH MRCP W CONTRAST; Future   Labs pending Health Maintenance reviewed Diet and exercise encouraged  Follow up plan: 3 months    Jannifer Rodney, FNP

## 2023-03-17 NOTE — Patient Instructions (Signed)
Health Maintenance After Age 69 After age 69, you are at a higher risk for certain long-term diseases and infections as well as injuries from falls. Falls are a major cause of broken bones and head injuries in people who are older than age 69. Getting regular preventive care can help to keep you healthy and well. Preventive care includes getting regular testing and making lifestyle changes as recommended by your health care provider. Talk with your health care provider about: Which screenings and tests you should have. A screening is a test that checks for a disease when you have no symptoms. A diet and exercise plan that is right for you. What should I know about screenings and tests to prevent falls? Screening and testing are the best ways to find a health problem early. Early diagnosis and treatment give you the best chance of managing medical conditions that are common after age 69. Certain conditions and lifestyle choices may make you more likely to have a fall. Your health care provider may recommend: Regular vision checks. Poor vision and conditions such as cataracts can make you more likely to have a fall. If you wear glasses, make sure to get your prescription updated if your vision changes. Medicine review. Work with your health care provider to regularly review all of the medicines you are taking, including over-the-counter medicines. Ask your health care provider about any side effects that may make you more likely to have a fall. Tell your health care provider if any medicines that you take make you feel dizzy or sleepy. Strength and balance checks. Your health care provider may recommend certain tests to check your strength and balance while standing, walking, or changing positions. Foot health exam. Foot pain and numbness, as well as not wearing proper footwear, can make you more likely to have a fall. Screenings, including: Osteoporosis screening. Osteoporosis is a condition that causes  the bones to get weaker and break more easily. Blood pressure screening. Blood pressure changes and medicines to control blood pressure can make you feel dizzy. Depression screening. You may be more likely to have a fall if you have a fear of falling, feel depressed, or feel unable to do activities that you used to do. Alcohol use screening. Using too much alcohol can affect your balance and may make you more likely to have a fall. Follow these instructions at home: Lifestyle Do not drink alcohol if: Your health care provider tells you not to drink. If you drink alcohol: Limit how much you have to: 0-1 drink a day for women. 0-2 drinks a day for men. Know how much alcohol is in your drink. In the [place] one drink equals one 12 oz bottle of beer (355 mL), one 5 oz glass of wine (148 mL), or one 1 oz glass of hard liquor (44 mL). Do not use any products that contain nicotine or tobacco. These products include cigarettes, chewing tobacco, and vaping devices, such as e-cigarettes. If you need help quitting, ask your health care provider. Activity  Follow a regular exercise program to stay fit. This will help you maintain your balance. Ask your health care provider what types of exercise are appropriate for you. If you need a cane or walker, use it as recommended by your health care provider. Wear supportive shoes that have nonskid soles. Safety  Remove any tripping hazards, such as rugs, cords, and clutter. Install safety equipment such as grab bars in bathrooms and safety rails on stairs. Keep rooms and walkways   well-lit. General instructions Talk with your health care provider about your risks for falling. Tell your health care provider if: You fall. Be sure to tell your health care provider about all falls, even ones that seem minor. You feel dizzy, tiredness (fatigue), or off-balance. Take over-the-counter and prescription medicines only as told by your health care provider. These include  supplements. Eat a healthy diet and maintain a healthy weight. A healthy diet includes low-fat dairy products, low-fat (lean) meats, and fiber from whole grains, beans, and lots of fruits and vegetables. Stay current with your vaccines. Schedule regular health, dental, and eye exams. Summary Having a healthy lifestyle and getting preventive care can help to protect your health and wellness after age 69. Screening and testing are the best way to find a health problem early and help you avoid having a fall. Early diagnosis and treatment give you the best chance for managing medical conditions that are more common for people who are older than age 69. Falls are a major cause of broken bones and head injuries in people who are older than age 69. Take precautions to prevent a fall at home. Work with your health care provider to learn what changes you can make to improve your health and wellness and to prevent falls. This information is not intended to replace advice given to you by your health care provider. Make sure you discuss any questions you have with your health care provider. Document Revised: 12/22/2020 Document Reviewed: 12/22/2020 Elsevier Patient Education  2024 Elsevier Inc.  

## 2023-03-17 NOTE — Telephone Encounter (Signed)
Please see if patient needs to have refills sent in; she only has 2 pens left  Check on novo shipment please & call patient

## 2023-03-18 ENCOUNTER — Other Ambulatory Visit: Payer: Self-pay | Admitting: Family

## 2023-03-18 DIAGNOSIS — R1011 Right upper quadrant pain: Secondary | ICD-10-CM

## 2023-03-24 ENCOUNTER — Other Ambulatory Visit: Payer: Self-pay

## 2023-04-01 ENCOUNTER — Ambulatory Visit: Payer: PPO

## 2023-04-01 ENCOUNTER — Ambulatory Visit
Admission: RE | Admit: 2023-04-01 | Discharge: 2023-04-01 | Disposition: A | Discharge status: Home / Self Care | Payer: PPO | Source: Ambulatory Visit | Attending: Family | Admitting: Family

## 2023-04-01 DIAGNOSIS — Z1231 Encounter for screening mammogram for malignant neoplasm of breast: Secondary | ICD-10-CM | POA: Diagnosis not present

## 2023-04-08 ENCOUNTER — Ambulatory Visit (HOSPITAL_COMMUNITY)
Admission: RE | Admit: 2023-04-08 | Discharge: 2023-04-08 | Disposition: A | Discharge status: Home / Self Care | Payer: PPO | Source: Ambulatory Visit | Attending: Family | Admitting: Family

## 2023-04-08 ENCOUNTER — Other Ambulatory Visit (HOSPITAL_COMMUNITY): Payer: Self-pay | Admitting: Family

## 2023-04-08 DIAGNOSIS — N281 Cyst of kidney, acquired: Secondary | ICD-10-CM | POA: Diagnosis not present

## 2023-04-08 DIAGNOSIS — K831 Obstruction of bile duct: Secondary | ICD-10-CM | POA: Diagnosis not present

## 2023-04-08 DIAGNOSIS — R1011 Right upper quadrant pain: Secondary | ICD-10-CM | POA: Insufficient documentation

## 2023-04-08 MED ORDER — GADOBUTROL 1 MMOL/ML IV SOLN
9.0000 mL | Freq: Once | INTRAVENOUS | Status: AC | PRN
  Administered 2023-04-08: 9 mL via INTRAVENOUS

## 2023-04-14 ENCOUNTER — Telehealth: Payer: Self-pay | Admitting: Cardiology

## 2023-04-14 DIAGNOSIS — G4733 Obstructive sleep apnea (adult) (pediatric): Secondary | ICD-10-CM | POA: Diagnosis not present

## 2023-04-14 NOTE — Telephone Encounter (Signed)
Patient is requesting to speak with the pharmacist in regards to her Repatha. Please advise.

## 2023-04-15 NOTE — Telephone Encounter (Signed)
Called pt back. She said Danielle Ellis had already called her and answered her question.

## 2023-04-19 ENCOUNTER — Telehealth: Payer: Self-pay | Admitting: Pharmacist

## 2023-04-19 NOTE — Telephone Encounter (Unsigned)
   Patient enrolled in the [employer] patient assistance program for [employer].  Patient is stable on current regimen.  She will need to re-enroll for 2025 in November.  4 month supply of Ozempic is here for pick up & patient will come to pick up this week.  Patient denies personal or family history of medullary thyroid carcinoma (MTC) or in patients with Multiple Endocrine Neoplasia syndrome type 2 (MEN 2).  Kieth Brightly, PharmD, BCACP Clinical Pharmacist, [facility]

## 2023-05-05 ENCOUNTER — Ambulatory Visit (INDEPENDENT_AMBULATORY_CARE_PROVIDER_SITE_OTHER): Payer: PPO | Admitting: Family

## 2023-05-05 ENCOUNTER — Encounter: Payer: Self-pay | Admitting: Family

## 2023-05-05 VITALS — BP 136/74 | HR 70 | Temp 97.9°F | Ht 61.0 in | Wt 204.4 lb

## 2023-05-05 DIAGNOSIS — M546 Pain in thoracic spine: Secondary | ICD-10-CM

## 2023-05-05 MED ORDER — BACLOFEN 10 MG PO TABS
10.0000 mg | ORAL_TABLET | Freq: Three times a day (TID) | ORAL | 0 refills | Status: DC
Start: 2023-05-05 — End: 2023-06-17

## 2023-05-05 MED ORDER — PREDNISONE 20 MG PO TABS
40.0000 mg | ORAL_TABLET | Freq: Every day | ORAL | 0 refills | Status: AC
Start: 2023-05-05 — End: 2023-05-10

## 2023-05-05 MED ORDER — DICLOFENAC SODIUM 75 MG PO TBEC
75.0000 mg | DELAYED_RELEASE_TABLET | Freq: Two times a day (BID) | ORAL | 2 refills | Status: DC
Start: 2023-05-05 — End: 2023-08-02

## 2023-05-05 NOTE — Progress Notes (Signed)
Subjective:    Patient ID: Danielle Ellis, female    DOB: 1955/10/21, 67 y.o.   MRN: [id number]  Chief Complaint  Patient presents with   Back Pain    Right side. Taken advil   Pt presents to the office today with right sided back pain that started 9 days ago. Reports she has been doing crafts and thinks she over did it.  Back Pain This is a new problem. The current episode started 1 to 4 weeks ago. The problem occurs intermittently. The problem has been waxing and waning since onset. The pain is present in the lumbar spine. The quality of the pain is described as aching. The pain is at a severity of 9/10. The pain is moderate. The symptoms are aggravated by twisting and bending. Pertinent negatives include no bladder incontinence, bowel incontinence, fever, headaches, leg pain or weakness. Risk factors include obesity. She has tried NSAIDs for the symptoms. The treatment provided mild relief.      Review of Systems  Constitutional:  Negative for fever.  Gastrointestinal:  Negative for bowel incontinence.  Genitourinary:  Negative for bladder incontinence.  Musculoskeletal:  Positive for back pain.  Neurological:  Negative for weakness and headaches.  All other systems reviewed and are negative.      Objective:   Physical Exam Vitals reviewed.  Constitutional:      General: She is not in acute distress.    Appearance: She is well-developed.  HENT:     Head: Normocephalic and atraumatic.  Eyes:     Pupils: Pupils are equal, round, and reactive to light.  Neck:     Thyroid: No thyromegaly.  Cardiovascular:     Rate and Rhythm: Normal rate and regular rhythm.     Heart sounds: Normal heart sounds. No murmur heard. Pulmonary:     Effort: Pulmonary effort is normal. No respiratory distress.     Breath sounds: Normal breath sounds. No wheezing.  Abdominal:     General: Bowel sounds are normal. There is no distension.     Palpations: Abdomen is soft.     Tenderness: There  is no abdominal tenderness.  Musculoskeletal:        General: No tenderness. Normal range of motion.     Cervical back: Normal range of motion and neck supple.     Comments: Pain in thoracic with flexion and extension   Skin:    General: Skin is warm and dry.  Neurological:     Mental Status: She is alert and oriented to person, place, and time.     Cranial Nerves: No cranial nerve deficit.     Deep Tendon Reflexes: Reflexes are normal and symmetric.  Psychiatric:        Behavior: Behavior normal.        Thought Content: Thought content normal.        Judgment: Judgment normal.       BP 136/74   Pulse 70   Temp 97.9 F (36.6 C) (Temporal)   Ht 5\' 1"  (1.549 m)   Wt 204 lb 6.4 oz (92.7 kg)   SpO2 95%   BMI 38.62 kg/m      Assessment & Plan:  AYSEL SECOY comes in today with chief complaint of Back Pain (Right side. Taken advil)   Diagnosis and orders addressed:  1. Acute right-sided thoracic back pain Rest Sedation precautions with baclofen  Diclofenac BID with food  Start prednisone  ROM exercises  Follow up if symptoms  worsen or do not improve  - predniSONE (DELTASONE) 20 MG tablet; Take 2 tablets (40 mg total) by mouth daily with breakfast for 5 days.  Dispense: 10 tablet; Refill: 0 - baclofen (LIORESAL) 10 MG tablet; Take 1 tablet (10 mg total) by mouth 3 (three) times daily.  Dispense: 30 each; Refill: 0 - diclofenac (VOLTAREN) 75 MG EC tablet; Take 1 tablet (75 mg total) by mouth 2 (two) times daily.  Dispense: 60 tablet; Refill: 2  Jannifer Rodney, FNP

## 2023-05-05 NOTE — Patient Instructions (Signed)
Thoracic Strain A thoracic strain is an injury to the muscles or tendons that attach to the upper part of your back behind your chest. Tendons are tissues that connect muscle to bone. This injury is sometimes called a mid-back strain. It happens when a muscle is stretched too far or overloaded. Thoracic strains can range from mild to severe. Mild strains may involve stretching a muscle or tendon without tearing it. These injuries may heal in 1-2 weeks. More severe strains involve tearing of muscle fibers or tendons. These will cause more pain and may take 6-8 weeks to heal. What are the causes? This condition may be caused by: Trauma, such as a fall or a hit to the body. Twisting or overstretching the back. This may result from doing activities that take a lot of energy, such as lifting heavy objects. In some cases, the cause may not be known. What increases the risk? This injury is more common in: Athletes. People with obesity. What are the signs or symptoms? The main symptom of this condition is pain in the middle back, especially with movement. Other symptoms include: Stiffness or limited range of motion. Sudden muscle tightening (spasms). How is this diagnosed? This condition may be diagnosed based on: Your symptoms. Your medical history. A physical exam. Imaging tests, such as X-rays, a CT scan, or an MRI. How is this treated? This condition may be treated with: Resting the injured area. Applying heat and cold to the injured area. Over-the-counter medicines for pain and inflammation, such as NSAIDs. Prescription pain medicine or muscle relaxants. These may be needed for a short time. Doing exercises to improve movement and strength in your back (physical therapy). Treatments applied to the painful area, such as: Electrical stimulation. Stimulating the muscle with small needles (dry needling). Injections of medicine (trigger point injections). Follow these instructions at  home: Managing pain, stiffness, and swelling     If told, put ice on the injured area. Put ice in a plastic bag. Place a towel between your skin and the bag. Leave the ice on for 20 minutes, 2-3 times a day. If told, apply heat to the affected area as often as told by your health care provider. Use the heat source that your provider recommends, such as a moist heat pack or a heating pad. Place a towel between your skin and the heat source. Leave the heat on for 20-30 minutes. If your skin turns bright red, remove the ice or heat right away to prevent skin damage. The risk of damage is higher if you cannot feel pain, heat, or cold. Activity Rest and return to your normal activities as told by your provider. Ask your provider what activities are safe for you. Do exercises as told by your provider. Medicines Take over-the-counter and prescription medicines only as told by your provider. Ask your provider if the medicine prescribed to you: Requires you to avoid driving or using machinery. Can cause constipation. You may need to take these actions to prevent or treat constipation: Drink enough fluid to keep your pee (urine) pale yellow. Take over-the-counter or prescription medicines. Eat foods that are high in fiber, such as beans, whole grains, and fresh fruits and vegetables. Limit foods that are high in fat and processed sugars, such as fried or sweet foods. General instructions Do not use any products that contain nicotine or tobacco. These products include cigarettes, chewing tobacco, and vaping devices, such as e-cigarettes. If you need help quitting, ask your provider. Keep all follow-up  visits. Your provider will monitor your injury and activity. How is this prevented? To prevent a future mid-back injury: Always warm up before physical activity or sports. Cool down and stretch after being active. Use correct form when playing sports and lifting heavy objects. Bend your knees  before you lift heavy objects. Use good posture when sitting and standing. Stay physically fit and maintain a healthy weight. Do at least 150 minutes of moderate-intensity exercise each week, such as brisk walking or water aerobics. Do strength exercises at least 2 times each week. Contact a health care provider if: Your pain is not helped by medicine. Your pain or stiffness is getting worse. You develop pain or stiffness in your neck or lower back. Get help right away if: You have shortness of breath. You have chest pain. You have numbness, tingling, or weakness in your legs. You are not able to control when you pee (urinary incontinence). These symptoms may be an emergency. Get help right away. Call 911. Do not wait to see if the symptoms will go away. Do not drive yourself to the hospital. This information is not intended to replace advice given to you by your health care provider. Make sure you discuss any questions you have with your health care provider. Document Revised: 12/06/2022 Document Reviewed: 03/22/2022 Elsevier Patient Education  2024 [employer].

## 2023-05-16 ENCOUNTER — Other Ambulatory Visit: Payer: Self-pay

## 2023-05-16 ENCOUNTER — Other Ambulatory Visit (HOSPITAL_COMMUNITY): Payer: Self-pay

## 2023-05-26 ENCOUNTER — Ambulatory Visit: Payer: PPO | Attending: Cardiology | Admitting: Cardiology

## 2023-05-26 ENCOUNTER — Encounter: Payer: Self-pay | Admitting: Cardiology

## 2023-05-26 VITALS — BP 140/66 | HR 78 | Ht 61.5 in | Wt 204.0 lb

## 2023-05-26 DIAGNOSIS — I10 Essential (primary) hypertension: Secondary | ICD-10-CM | POA: Diagnosis not present

## 2023-05-26 DIAGNOSIS — E119 Type 2 diabetes mellitus without complications: Secondary | ICD-10-CM | POA: Diagnosis not present

## 2023-05-26 DIAGNOSIS — E785 Hyperlipidemia, unspecified: Secondary | ICD-10-CM

## 2023-05-26 DIAGNOSIS — Z951 Presence of aortocoronary bypass graft: Secondary | ICD-10-CM

## 2023-05-26 DIAGNOSIS — I2510 Atherosclerotic heart disease of native coronary artery without angina pectoris: Secondary | ICD-10-CM

## 2023-05-26 DIAGNOSIS — G4733 Obstructive sleep apnea (adult) (pediatric): Secondary | ICD-10-CM | POA: Diagnosis not present

## 2023-05-26 MED ORDER — LOSARTAN POTASSIUM 100 MG PO TABS: 100.0000 mg | ORAL_TABLET | Freq: Every day | ORAL | 3 refills | Status: DC

## 2023-05-26 NOTE — Patient Instructions (Signed)
Medication Instructions:  Continue taking medications  *If you need a refill on your cardiac medications before your next appointment, please call your pharmacy*   Lab Work: None    Testing/Procedures: none   Follow-Up: At [facility], you and your health needs are our priority.  As part of our continuing mission to provide you with exceptional heart care, we have created designated Provider Care Teams.  These Care Teams include your primary Cardiologist (physician) and Advanced Practice Providers (APPs -  Physician Assistants and Nurse Practitioners) who all work together to provide you with the care you need, when you need it.  We recommend signing up for the patient portal called "MyChart".  Sign up information is provided on this After Visit Summary.  MyChart is used to connect with patients for Virtual Visits (Telemedicine).  Patients are able to view lab/test results, encounter notes, upcoming appointments, etc.  Non-urgent messages can be sent to your provider as well.   To learn more about what you can do with MyChart, go to [email or web address].    Your next appointment:   Follow up in one year,   call in June to schedule appointment in October  Provider:   Dr.Jordan

## 2023-05-26 NOTE — Progress Notes (Signed)
Cardiology Office Note   Date:  05/26/2023   ID:  Danielle Ellis, DOB 1955-09-22, MRN [id number]  PCP:  Junie Spencer, FNP  Cardiologist:   Lakia Gritton [place], MD   Chief Complaint  Patient presents with   Follow-up    6 months.       History of Present Illness: Danielle Ellis is a 67 y.o. female who presents for follow up CAD. She has a a PMH of hypertension, coronary artery disease status post NSTEMI 10/09/2020, type 2 diabetes, hyperlipidemia, generalized anxiety disorder, CABG x5 on 10/14/2020 (LIMA-LAD, SVG-diagonal, SVG-OM1/OM2, and SVG-PDA)   She was admitted to the hospital on 10/10/2020.  She reported several week history of recurrent episodes of substernal chest pain that would radiate to her left shoulder and left hand.  She also noted some associated shortness of breath diaphoresis and nausea.  She was noted to have mildly elevated troponins and her EKG showed diffuse ST depressions  with new T wave inversions inferiorly.  Echocardiogram showed normal LVEF with no significant valvular abnormalities.  She underwent cardiac catheterization which showed severe multivessel disease.  She underwent CABG x5 on 10/14/2020.   She maintained sinus rhythm.  Due to her NSTEMI she was placed on Plavix and her aspirin was decreased to 81 mg daily.   She was discharged in stable condition on 10/19/2020. When seen in March it was noted she had discontinued CPAP therapy several years before and sleep study was ordered. This was performed and BIPAP was recommended with follow up in sleep clinic.   She was seen back in September and was on BIPAP with significant improvement in symptoms. Was referred to lipid clinic since not at goal on maximally tolerated statin. PCSK 9 inhibitor was started. Tolerating this well.   She states she feels great. No chest pain or dyspnea. No palpitations, dizziness or edema. Tolerating medication well. BP is well controlled.   She is very active and still working daily  in [employer] and walking. She was noted to have some elevation of her LFTs. Abdominal US showed no definite stones but possible sludge. MRI was benign.     Past Medical History:  Diagnosis Date   Anxiety    Hypertension    Sleep apnea     Past Surgical History:  Procedure Laterality Date   APPENDECTOMY     CORONARY ARTERY BYPASS GRAFT N/A 10/14/2020   Procedure: CORONARY ARTERY BYPASS GRAFTING (CABG) X FIVE, USING LEFT INTERNAL MAMMARY ARTERY AND BILATERAL LEGS GREATER SAPHENOUS VEINS HARVESTED ENDOSCOPICALLY;  Surgeon: Alleen Borne, MD;  Location: [facility] OR;  Service: Open Heart Surgery;  Laterality: N/A;   L ankle surgery     LEFT HEART CATH AND CORONARY ANGIOGRAPHY N/A 10/10/2020   Procedure: LEFT HEART CATH AND CORONARY ANGIOGRAPHY;  Surgeon: [place], Rashmi Tallent M, MD;  Location: [facility] INVASIVE CV LAB;  Service: Cardiovascular;  Laterality: N/A;   TEE WITHOUT CARDIOVERSION N/A 10/14/2020   Procedure: TRANSESOPHAGEAL ECHOCARDIOGRAM (TEE);  Surgeon: Alleen Borne, MD;  Location: [facility] OR;  Service: Open Heart Surgery;  Laterality: N/A;     Current Outpatient Medications  Medication Sig Dispense Refill   acetaminophen (TYLENOL) 325 MG tablet Take 650 mg by mouth every 6 (six) hours as needed.     amLODipine (NORVASC) 5 MG tablet Take 1 tablet (5 mg total) by mouth daily. 90 tablet 2   aspirin EC 81 MG tablet Take 1 tablet (81 mg total) by mouth daily. Swallow whole. 90  tablet 3   baclofen (LIORESAL) 10 MG tablet Take 1 tablet (10 mg total) by mouth 3 (three) times daily. 30 each 0   blood glucose meter kit and supplies Dispense based on patient and insurance preference. Use up to four times daily as directed. (FOR ICD-10 E10.9, E11.9). 1 each 0   calcium gluconate 500 MG tablet Take 500 mg by mouth daily.     diclofenac (VOLTAREN) 75 MG EC tablet Take 1 tablet (75 mg total) by mouth 2 (two) times daily. 60 tablet 2   escitalopram (LEXAPRO) 10 MG tablet Take 1 tablet (10 mg total) by mouth  daily. 90 tablet 2   Evolocumab (REPATHA SURECLICK) 140 MG/ML SOAJ INJECT 140 MG INTO THE SKIN EVERY 14 (FOURTEEN) DAYS. 6 mL 3   fluticasone (FLONASE) 50 MCG/ACT nasal spray Place 1 spray into both nostrils daily. 48 g 1   glucose blood (ONETOUCH ULTRA) test strip Test blood sugar 4 times daily as directed. 400 strip 3   Lancets (ONETOUCH ULTRASOFT) lancets TEST BLOOD SUGAR 4 TIMES  DAILY 400 each 3   latanoprost (XALATAN) 0.005 % ophthalmic solution Place 1 drop into both eyes daily 2.5 mL 6   loratadine (CLARITIN) 10 MG tablet Take 10 mg by mouth daily.     losartan (COZAAR) 100 MG tablet Take 1 tablet (100 mg total) by mouth daily. 90 tablet 3   metoprolol succinate (TOPROL-XL) 100 MG 24 hr tablet Take 1 tablet (100 mg total) by mouth daily. Take with or immediately following a meal. 90 tablet 3   Multiple Vitamin (MULTIVITAMIN) capsule Take 1 capsule by mouth daily.     rosuvastatin (CRESTOR) 10 MG tablet Take 0.5 tablets (5 mg total) by mouth daily. 45 tablet 3   Semaglutide, 2 MG/DOSE, 8 MG/3ML SOPN Inject 2 mg as directed once a week. 9 mL 2   No current facility-administered medications for this visit.    Allergies:   Patient has no known allergies.    Social History:  The patient  reports that she has never smoked. She has never used smokeless tobacco. She reports that she does not drink alcohol and does not use drugs.   Family History:  The patient's family history includes Cancer in her mother; Diabetes in her mother; Heart attack in her mother; Kidney disease in her mother.    ROS:  Please see the history of present illness.   Otherwise, review of systems are positive for none.   All other systems are reviewed and negative.    PHYSICAL EXAM: VS:  BP (!) 140/66 (BP Location: Left Arm, Patient Position: Sitting, Cuff Size: Normal)   Pulse 78   Ht 5' 1.5" (1.562 m)   Wt 204 lb (92.5 kg)   BMI 37.92 kg/m  , BMI Body mass index is 37.92 kg/m. GEN: Well nourished, obese, in  no acute distress  HEENT: normal  Neck: no JVD, carotid bruits, or masses Cardiac: RRR; no murmurs, rubs, or gallops,no edema. Sternal incision has healed well.  Respiratory:  clear to auscultation bilaterally, normal work of breathing GI: soft, nontender, nondistended, + BS MS: no deformity or atrophy  Skin: warm and dry, no rash Neuro:  Strength and sensation are intact Psych: euthymic mood, full affect   EKG:  EKG is not ordered today.     Recent Labs: 03/17/2023: ALT 86; BUN 12; Creatinine, Ser 0.87; Hemoglobin 14.0; Platelets 205; Potassium 4.5; Sodium 141; TSH 2.360    Lipid Panel    Component  Value Date/Time   CHOL 142 03/17/2023 1104   TRIG 235 (H) 03/17/2023 1104   HDL 45 03/17/2023 1104   CHOLHDL 3.2 03/17/2023 1104   CHOLHDL 8.2 10/10/2020 0144   VLDL 57 (H) 10/10/2020 0144   LDLCALC 59 03/17/2023 1104      Wt Readings from Last 3 Encounters:  05/26/23 204 lb (92.5 kg)  05/05/23 204 lb 6.4 oz (92.7 kg)  03/17/23 201 lb 6.4 oz (91.4 kg)      Other studies Reviewed: Additional studies/ records that were reviewed today include: sleep study as noted in chart.    ASSESSMENT AND PLAN:  1.  Coronary artery disease-presented with NSTEMI. Found to have multivessel CAD. Status post CABG x5 October 14, 2020 Continue ASA, Toprol, statin.  Heart healthy low Remain active Encourage weight loss.    2. Essential hypertension-BP is well controlled per patient report Heart healthy low-sodium diet On losartan and Toprol XL.  On Amlodipine.     3. Hyperlipidemia- intolerant to lipitor and pravastatin in the past. On low dose  Crestor 5mg  daily.  Now on Repatha.  Excellent response with LDL down to 59.    4. OSA/snoring/daytime somnolence -abnormal sleep study. Now on BIPAP. Follow up with Dr Tresa Endo.   5. Type 2 diabetes-A1c 5.5%.  Continue Metformin/ Ozempic Heart healthy low-sodium carb modified diet Per primary care.       Disposition:   FU with me in one  year   Signed, Karnell Vanderloop [place], MD  05/26/2023 2:22 PM    [facility] [address] [place], [place], [address] Phone [phone], Fax [phone]

## 2023-05-30 ENCOUNTER — Telehealth: Payer: Self-pay | Admitting: Cardiology

## 2023-05-30 ENCOUNTER — Other Ambulatory Visit (HOSPITAL_COMMUNITY): Payer: Self-pay

## 2023-05-30 ENCOUNTER — Other Ambulatory Visit: Payer: Self-pay | Admitting: Cardiology

## 2023-05-30 NOTE — Telephone Encounter (Signed)
*  STAT* If patient is at the pharmacy, call can be transferred to refill team.   1. Which medications need to be refilled? (please list name of each medication and dose if known)   losartan (COZAAR) 100 MG tablet  metoprolol succinate (TOPROL-XL) 100 MG 24 hr tablet     4. Which pharmacy/location (including street and city if local pharmacy) is medication to be sent to? [facility] - [place], [place] - [address]     5. Do they need a 30 day or 90 day supply? 90     Pt states she is completely out

## 2023-05-30 NOTE — Telephone Encounter (Signed)
*  STAT* If patient is at the pharmacy, call can be transferred to refill team.   1. Which medications need to be refilled? (please list name of each medication and dose if known)  Losartan and Metoprolol   2. Would you like to learn more about the convenience, safety, & potential cost savings by using the [facility]?    3. Are you open to using the [facility] (Type [facility]. .   4. Which pharmacy/location (including street and city if local pharmacy) is medication to be sent to?[facility]   5. Do they need a 30 day or 90 day supply? 90 days and refills- please call today- out of medicine

## 2023-05-31 ENCOUNTER — Other Ambulatory Visit: Payer: Self-pay | Admitting: Cardiology

## 2023-05-31 ENCOUNTER — Other Ambulatory Visit (HOSPITAL_COMMUNITY): Payer: Self-pay

## 2023-05-31 ENCOUNTER — Other Ambulatory Visit: Payer: Self-pay

## 2023-05-31 MED ORDER — LOSARTAN POTASSIUM 100 MG PO TABS
100.0000 mg | ORAL_TABLET | Freq: Every day | ORAL | 3 refills | Status: DC
  Filled 2023-05-31: qty 90, 90d supply, fill #0
  Filled 2023-09-07: qty 90, 90d supply, fill #1

## 2023-05-31 NOTE — Telephone Encounter (Signed)
*  STAT* If patient is at the pharmacy, call can be transferred to refill team.   1. Which medications need to be refilled? (please list name of each medication and dose if known)   metoprolol succinate (TOPROL-XL) 100 MG 24 hr tablet   2. Would you like to learn more about the convenience, safety, & potential cost savings by using the [facility]?   3. Are you open to using the [facility] ([facility]. ).  4. Which pharmacy/location (including street and city if local pharmacy) is medication to be sent to?  [facility]   5. Do they need a 30 day or 90 day supply?  90 day  Caller Dahlia Client) stated patient is changing pharmacy to [facility] and wants refill prescription sent.  Caller stated patient only has 1 day left of this medication.

## 2023-05-31 NOTE — Telephone Encounter (Signed)
Pt states she wants her medications called in to   [facility]  . Pt states she has got these medications delivered for years. Please advise

## 2023-06-01 ENCOUNTER — Other Ambulatory Visit: Payer: Self-pay

## 2023-06-01 ENCOUNTER — Other Ambulatory Visit (HOSPITAL_COMMUNITY): Payer: Self-pay

## 2023-06-02 ENCOUNTER — Telehealth: Payer: Self-pay | Admitting: Pharmacist

## 2023-06-02 ENCOUNTER — Encounter: Payer: Self-pay | Admitting: Pharmacist

## 2023-06-02 NOTE — Telephone Encounter (Signed)
Patient called to request re enrolling in [employer]. Enrolled patient and sent info over my Chart. Patient appreciative.  Card No. [id number] Card Status Active BIN [id number] PCN [id number] [id number] Help Desk [phone]

## 2023-06-03 ENCOUNTER — Other Ambulatory Visit (HOSPITAL_COMMUNITY): Payer: Self-pay

## 2023-06-14 DIAGNOSIS — G4733 Obstructive sleep apnea (adult) (pediatric): Secondary | ICD-10-CM | POA: Diagnosis not present

## 2023-06-17 ENCOUNTER — Ambulatory Visit: Payer: PPO | Admitting: Pharmacist

## 2023-06-17 ENCOUNTER — Ambulatory Visit (INDEPENDENT_AMBULATORY_CARE_PROVIDER_SITE_OTHER): Payer: PPO | Admitting: Family

## 2023-06-17 ENCOUNTER — Other Ambulatory Visit: Payer: Self-pay

## 2023-06-17 ENCOUNTER — Encounter: Payer: Self-pay | Admitting: Family

## 2023-06-17 ENCOUNTER — Other Ambulatory Visit (HOSPITAL_COMMUNITY): Payer: Self-pay

## 2023-06-17 VITALS — BP 132/67 | HR 66 | Temp 98.0°F | Ht 61.5 in | Wt 203.6 lb

## 2023-06-17 DIAGNOSIS — Z7985 Long-term (current) use of injectable non-insulin antidiabetic drugs: Secondary | ICD-10-CM

## 2023-06-17 DIAGNOSIS — M546 Pain in thoracic spine: Secondary | ICD-10-CM

## 2023-06-17 DIAGNOSIS — I2510 Atherosclerotic heart disease of native coronary artery without angina pectoris: Secondary | ICD-10-CM

## 2023-06-17 DIAGNOSIS — E119 Type 2 diabetes mellitus without complications: Secondary | ICD-10-CM

## 2023-06-17 DIAGNOSIS — Z23 Encounter for immunization: Secondary | ICD-10-CM | POA: Diagnosis not present

## 2023-06-17 DIAGNOSIS — E782 Mixed hyperlipidemia: Secondary | ICD-10-CM | POA: Diagnosis not present

## 2023-06-17 DIAGNOSIS — E1169 Type 2 diabetes mellitus with other specified complication: Secondary | ICD-10-CM | POA: Diagnosis not present

## 2023-06-17 DIAGNOSIS — I10 Essential (primary) hypertension: Secondary | ICD-10-CM

## 2023-06-17 DIAGNOSIS — F320 Major depressive disorder, single episode, mild: Secondary | ICD-10-CM

## 2023-06-17 DIAGNOSIS — I252 Old myocardial infarction: Secondary | ICD-10-CM

## 2023-06-17 DIAGNOSIS — Z951 Presence of aortocoronary bypass graft: Secondary | ICD-10-CM

## 2023-06-17 DIAGNOSIS — F411 Generalized anxiety disorder: Secondary | ICD-10-CM

## 2023-06-17 LAB — CMP14+EGFR
ALT: 69 [IU]/L — ABNORMAL HIGH (ref 0–32)
AST: 29 [IU]/L (ref 0–40)
Albumin: 4.6 g/dL (ref 3.9–4.9)
Alkaline Phosphatase: 185 [IU]/L — ABNORMAL HIGH (ref 44–121)
BUN/Creatinine Ratio: 15 (ref 12–28)
BUN: 16 mg/dL (ref 8–27)
Bilirubin Total: 0.5 mg/dL (ref 0.0–1.2)
CO2: 22 mmol/L (ref 20–29)
Calcium: 9.6 mg/dL (ref 8.7–10.3)
Chloride: 104 mmol/L (ref 96–106)
Creatinine, Ser: 1.06 mg/dL — ABNORMAL HIGH (ref 0.57–1.00)
Globulin, Total: 2.3 g/dL (ref 1.5–4.5)
Glucose: 83 mg/dL (ref 70–99)
Potassium: 4.2 mmol/L (ref 3.5–5.2)
Sodium: 144 mmol/L (ref 134–144)
Total Protein: 6.9 g/dL (ref 6.0–8.5)
eGFR: 57 mL/min/{1.73_m2} — ABNORMAL LOW (ref >59)

## 2023-06-17 LAB — BAYER DCA HB A1C WAIVED: HB A1C (BAYER DCA - WAIVED): 5.5 % (ref 4.8–5.6)

## 2023-06-17 MED ORDER — BACLOFEN 10 MG PO TABS
10.0000 mg | ORAL_TABLET | Freq: Three times a day (TID) | ORAL | 0 refills | Status: AC
Start: 2023-06-17 — End: ?
  Filled 2023-06-17: qty 30, 10d supply, fill #0

## 2023-06-17 NOTE — Patient Instructions (Signed)
Health Maintenance After Age 69 After age 69, you are at a higher risk for certain long-term diseases and infections as well as injuries from falls. Falls are a major cause of broken bones and head injuries in people who are older than age 69. Getting regular preventive care can help to keep you healthy and well. Preventive care includes getting regular testing and making lifestyle changes as recommended by your health care provider. Talk with your health care provider about: Which screenings and tests you should have. A screening is a test that checks for a disease when you have no symptoms. A diet and exercise plan that is right for you. What should I know about screenings and tests to prevent falls? Screening and testing are the best ways to find a health problem early. Early diagnosis and treatment give you the best chance of managing medical conditions that are common after age 69. Certain conditions and lifestyle choices may make you more likely to have a fall. Your health care provider may recommend: Regular vision checks. Poor vision and conditions such as cataracts can make you more likely to have a fall. If you wear glasses, make sure to get your prescription updated if your vision changes. Medicine review. Work with your health care provider to regularly review all of the medicines you are taking, including over-the-counter medicines. Ask your health care provider about any side effects that may make you more likely to have a fall. Tell your health care provider if any medicines that you take make you feel dizzy or sleepy. Strength and balance checks. Your health care provider may recommend certain tests to check your strength and balance while standing, walking, or changing positions. Foot health exam. Foot pain and numbness, as well as not wearing proper footwear, can make you more likely to have a fall. Screenings, including: Osteoporosis screening. Osteoporosis is a condition that causes  the bones to get weaker and break more easily. Blood pressure screening. Blood pressure changes and medicines to control blood pressure can make you feel dizzy. Depression screening. You may be more likely to have a fall if you have a fear of falling, feel depressed, or feel unable to do activities that you used to do. Alcohol use screening. Using too much alcohol can affect your balance and may make you more likely to have a fall. Follow these instructions at home: Lifestyle Do not drink alcohol if: Your health care provider tells you not to drink. If you drink alcohol: Limit how much you have to: 0-1 drink a day for women. 0-2 drinks a day for men. Know how much alcohol is in your drink. In the [place] one drink equals one 12 oz bottle of beer (355 mL), one 5 oz glass of wine (148 mL), or one 1 oz glass of hard liquor (44 mL). Do not use any products that contain nicotine or tobacco. These products include cigarettes, chewing tobacco, and vaping devices, such as e-cigarettes. If you need help quitting, ask your health care provider. Activity  Follow a regular exercise program to stay fit. This will help you maintain your balance. Ask your health care provider what types of exercise are appropriate for you. If you need a cane or walker, use it as recommended by your health care provider. Wear supportive shoes that have nonskid soles. Safety  Remove any tripping hazards, such as rugs, cords, and clutter. Install safety equipment such as grab bars in bathrooms and safety rails on stairs. Keep rooms and walkways   well-lit. General instructions Talk with your health care provider about your risks for falling. Tell your health care provider if: You fall. Be sure to tell your health care provider about all falls, even ones that seem minor. You feel dizzy, tiredness (fatigue), or off-balance. Take over-the-counter and prescription medicines only as told by your health care provider. These include  supplements. Eat a healthy diet and maintain a healthy weight. A healthy diet includes low-fat dairy products, low-fat (lean) meats, and fiber from whole grains, beans, and lots of fruits and vegetables. Stay current with your vaccines. Schedule regular health, dental, and eye exams. Summary Having a healthy lifestyle and getting preventive care can help to protect your health and wellness after age 69. Screening and testing are the best way to find a health problem early and help you avoid having a fall. Early diagnosis and treatment give you the best chance for managing medical conditions that are more common for people who are older than age 69. Falls are a major cause of broken bones and head injuries in people who are older than age 69. Take precautions to prevent a fall at home. Work with your health care provider to learn what changes you can make to improve your health and wellness and to prevent falls. This information is not intended to replace advice given to you by your health care provider. Make sure you discuss any questions you have with your health care provider. Document Revised: 12/22/2020 Document Reviewed: 12/22/2020 Elsevier Patient Education  2024 Elsevier Inc.  

## 2023-06-17 NOTE — Progress Notes (Signed)
06/17/2023 Name: Danielle Ellis MRN: [id number] DOB: [birth year]  Chief Complaint  Patient presents with   Diabetes    Danielle Ellis is a 67 y.o. year old female who was referred for medication management by their primary care provider, Junie Spencer, FNP. They presented for a face to face visit today.   They were referred to the pharmacist by their PCP for assistance in managing diabetes and medication access    Subjective:  Care Team: Primary Care Provider: Junie Spencer, FNP   Medication Access/Adherence  Current Pharmacy:  [facility] [address] [address] Phone: [phone] Fax: [phone]  [id number] - [place], [place] - [address] [place] [address] Phone: [phone] Fax: [phone]   Patient reports affordability concerns with their medications: Yes  Patient reports access/transportation concerns to their pharmacy: No  Patient reports adherence concerns with their medications:  No     Diabetes:  Current medications: ozempic Medications tried in the past: metformin, levemir  Current glucose readings: FBG<130; at goal Using ONE TOUCH ULTRA meter; testing 1 time daily  Patient denies hypoglycemic s/sx including dizziness, shakiness, sweating. Patient denies hyperglycemic symptoms including polyuria, polydipsia, polyphagia, nocturia, neuropathy, blurred vision.  Current meal patterns:  FOLLOWING A HEART HEALTHY DIET/HEALTHY PLATE METHOD  Current physical activity: ENCOURAGED AS ABLE  Current medication access support: [employer]OVO [employer]K PAP FOR OZEMPIC (ENROLLING FOR 2025)   Objective:  Lab Results  Component Value Date   HGBA1C 5.5 06/17/2023    Lab Results  Component Value Date   CREATININE 1.06 (H) 06/17/2023   BUN 16 06/17/2023   NA 144 06/17/2023   K 4.2 06/17/2023   CL 104 06/17/2023   CO2 22 06/17/2023    Lab  Results  Component Value Date   CHOL 142 03/17/2023   HDL 45 03/17/2023   LDLCALC 59 03/17/2023   TRIG 235 (H) 03/17/2023   CHOLHDL 3.2 03/17/2023    Medications Reviewed Today     Reviewed by Danella Maiers, [facility] (Pharmacist) on 06/17/23 at 0933  Med List Status: <None>   Medication Order Taking? Sig Documenting Provider Last Dose Status Informant  acetaminophen (TYLENOL) 325 MG tablet [id number] No Take 650 mg by mouth every 6 (six) hours as needed. [provider] Taking Active   amLODipine (NORVASC) 5 MG tablet [id number] No Take 1 tablet (5 mg total) by mouth daily. Junie Spencer, FNP Taking Active   baclofen (LIORESAL) 10 MG tablet [id number]  Take 1 tablet (10 mg total) by mouth 3 (three) times daily. Junie Spencer, FNP  Active   blood glucose meter kit and supplies [id number] No Dispense based on patient and insurance preference. Use up to four times daily as directed. (FOR ICD-10 E10.9, E11.9). Junie Spencer, FNP Taking Active   calcium gluconate 500 MG tablet [id number] No Take 500 mg by mouth daily. [provider] Taking Active Self  diclofenac (VOLTAREN) 75 MG EC tablet [id number] No Take 1 tablet (75 mg total) by mouth 2 (two) times daily. Junie Spencer, FNP Taking Active   escitalopram (LEXAPRO) 10 MG tablet [id number] No Take 1 tablet (10 mg total) by mouth daily. Junie Spencer, FNP Taking Active   Evolocumab (REPATHA SURECLICK) 140 MG/ML SOAJ [id number] No INJECT 140 MG INTO THE SKIN EVERY 14 (FOURTEEN) DAYS. [place], Peter M, MD Taking Active   fluticasone Aleda Grana)  50 MCG/ACT nasal spray [id number] No Place 1 spray into both nostrils daily. Junie Spencer, FNP Taking Active   glucose blood (ONETOUCH ULTRA) test strip [id number] No Test blood sugar 4 times daily as directed. Junie Spencer, FNP Taking Active   latanoprost (XALATAN) 0.005 % ophthalmic solution [id number] No Place 1 drop into both eyes daily  Taking Active   loratadine (CLARITIN) 10 MG  tablet [id number] No Take 10 mg by mouth daily. [provider] Taking Active   losartan (COZAAR) 100 MG tablet [id number] No Take 1 tablet (100 mg total) by mouth daily. [place], Peter M, MD Taking Active   metoprolol succinate (TOPROL-XL) 100 MG 24 hr tablet [id number] No Take 1 tablet (100 mg total) by mouth daily. Take with or immediately following a meal. [place], Peter M, MD Taking Active   rosuvastatin (CRESTOR) 10 MG tablet [id number] No Take 0.5 tablets (5 mg total) by mouth daily. [place], Peter M, MD Taking Active   Semaglutide, 2 MG/DOSE, 8 MG/3ML Namon Cirri [id number] No Inject 2 mg as directed once a week. Junie Spencer, FNP Taking Active            Med Note Cresenciano Genre, Lilla Shook   Fri Jun 17, 2023  9:33 AM) Via novo nordisk patient assistance program              Assessment/Plan:   Diabetes: - Currently controlled - Reviewed long term cardiovascular and renal outcomes of uncontrolled blood sugar - Reviewed goal A1c, goal fasting, and goal 2 hour post prandial glucose - Recommend to CONTINUE OZEMPIC  - Recommend to check glucose DAILY/FASTING OR IF SYMPTOMATIC - Meets financial criteria for [place] patient assistance program through [employer]. Will collaborate with provider, CPhT, and patient to pursue assistance.   Current medication access support: NOVO N[employer]ISK PAP FOR OZEMPIC (RE-ENROLLING FOR 2025); CPHT SUBMITTED ONLINE   Follow Up Plan: AS NEEDED  Kieth Brightly, PharmD, BCACP, CPP Clinical Pharmacist, [facility]

## 2023-06-17 NOTE — Progress Notes (Signed)
Subjective:    Patient ID: Danielle Ellis, female    DOB: 26-Nov-1955, 67 y.o.   MRN: [id number]  Chief Complaint  Patient presents with   Medical Management of Chronic Issues    Friends dx with Cancer. Causing anxiety     Pt present to the office today chronic follow up. She is followed by Cardiologists every 6 months  for NSTEMI on 10/09/20.    She was started Ozempic 2 mg. She had lost 12 lbs. She is morbid obese with a BMI of 37 with HTN and DM.      06/17/2023    8:36 AM 05/26/2023    1:58 PM 05/05/2023    2:12 PM  Last 3 Weights  Weight (lbs) 203 lb 9.6 oz 204 lb 204 lb 6.4 oz  Weight (kg) 92.352 kg 92.534 kg 92.715 kg     Hypertension This is a chronic problem. The current episode started more than 1 year ago. The problem has been resolved since onset. The problem is controlled. Associated symptoms include anxiety. Pertinent negatives include no blurred vision, malaise/fatigue, peripheral edema or shortness of breath. Risk factors for coronary artery disease include dyslipidemia, diabetes mellitus, obesity and sedentary lifestyle. The current treatment provides moderate improvement.  Diabetes She presents for her follow-up diabetic visit. She has type 2 diabetes mellitus. Hypoglycemia symptoms include nervousness/anxiousness. Pertinent negatives for diabetes include no blurred vision, no fatigue and no foot paresthesias. Symptoms are stable. Risk factors for coronary artery disease include dyslipidemia, diabetes mellitus, hypertension and sedentary lifestyle. She is following a generally healthy diet. Her overall blood glucose range is 90-110 mg/dl. Eye exam is current.  Hyperlipidemia This is a chronic problem. The current episode started more than 1 year ago. The problem is controlled. Recent lipid tests were reviewed and are normal. Exacerbating diseases include obesity. Pertinent negatives include no shortness of breath. Current antihyperlipidemic treatment includes statins.  The current treatment provides moderate improvement of lipids. Risk factors for coronary artery disease include diabetes mellitus, hypertension, a sedentary lifestyle, post-menopausal, dyslipidemia and obesity.  Depression        This is a chronic problem.  The current episode started more than 1 year ago.   The problem occurs intermittently.  Associated symptoms include sad.  Associated symptoms include no fatigue, no helplessness and no hopelessness.  Past treatments include SSRIs - Selective serotonin reuptake inhibitors.  Past medical history includes anxiety.   Anxiety Presents for follow-up visit. Symptoms include excessive worry and nervous/anxious behavior. Patient reports no hyperventilation or shortness of breath. Symptoms occur occasionally.        Review of Systems  Constitutional:  Negative for fatigue and malaise/fatigue.  Eyes:  Negative for blurred vision.  Respiratory:  Negative for shortness of breath.   Psychiatric/Behavioral:  Positive for depression. The patient is nervous/anxious.   All other systems reviewed and are negative.      Objective:   Physical Exam Vitals reviewed.  Constitutional:      General: She is not in acute distress.    Appearance: She is well-developed. She is obese.  HENT:     Head: Normocephalic and atraumatic.     Right Ear: Tympanic membrane normal.     Left Ear: Tympanic membrane normal.  Eyes:     Pupils: Pupils are equal, round, and reactive to light.  Neck:     Thyroid: No thyromegaly.  Cardiovascular:     Rate and Rhythm: Normal rate and regular rhythm.     Heart  sounds: Normal heart sounds. No murmur heard. Pulmonary:     Effort: Pulmonary effort is normal. No respiratory distress.     Breath sounds: Normal breath sounds. No wheezing.  Abdominal:     General: Bowel sounds are normal. There is no distension.     Palpations: Abdomen is soft.     Tenderness: There is no abdominal tenderness.  Musculoskeletal:        General:  No tenderness. Normal range of motion.     Cervical back: Normal range of motion and neck supple.  Skin:    General: Skin is warm and dry.  Neurological:     Mental Status: She is alert and oriented to person, place, and time.     Cranial Nerves: No cranial nerve deficit.     Deep Tendon Reflexes: Reflexes are normal and symmetric.  Psychiatric:        Behavior: Behavior normal.        Thought Content: Thought content normal.        Judgment: Judgment normal.       BP 132/67   Pulse 66   Temp 98 F (36.7 C) (Temporal)   Ht 5' 1.5" (1.562 m)   Wt 203 lb 9.6 oz (92.4 kg)   BMI 37.85 kg/m      Assessment & Plan:  Danielle Ellis comes in today with chief complaint of Medical Management of Chronic Issues (Friends dx with Cancer. Causing anxiety )   Diagnosis and orders addressed:  1. Acute right-sided thoracic back pain - baclofen (LIORESAL) 10 MG tablet; Take 1 tablet (10 mg total) by mouth 3 (three) times daily.  Dispense: 30 each; Refill: 0 - CMP14+EGFR  2. Hypertension, unspecified type - CMP14+EGFR  3. Mixed hyperlipidemia - CMP14+EGFR  4. Depression, major, single episode, mild (HCC) - CMP14+EGFR  5. GAD (generalized anxiety disorder) - CMP14+EGFR  6. Type 2 diabetes mellitus with other specified complication, without long-term current use of insulin (HCC) - Bayer DCA Hb A1c Waived - CMP14+EGFR  7. S/P CABG x 5 - CMP14+EGFR  8. Coronary artery disease involving native coronary artery of native heart without angina pectoris - CMP14+EGFR  9. Morbid obesity (HCC) - CMP14+EGFR  10. History of non-ST elevation myocardial infarction (NSTEMI) - CMP14+EGFR  11. Encounter for immunization - Flu Vaccine Trivalent High Dose (Fluad)   Labs pending Continue current medications  Health Maintenance reviewed Diet and exercise encouraged  Follow up plan: 3 months    Jannifer Rodney, FNP

## 2023-06-22 ENCOUNTER — Other Ambulatory Visit (HOSPITAL_COMMUNITY): Payer: Self-pay

## 2023-06-22 ENCOUNTER — Telehealth: Payer: Self-pay

## 2023-06-22 NOTE — Telephone Encounter (Signed)
Submitted re-enrollment application for OZEMPIC to [employer] for patient assistance.   Phone: [phone]

## 2023-07-07 ENCOUNTER — Encounter: Payer: Self-pay | Admitting: Cardiovascular Disease

## 2023-07-07 ENCOUNTER — Ambulatory Visit: Payer: PPO | Attending: Cardiovascular Disease | Admitting: Cardiovascular Disease

## 2023-07-07 VITALS — BP 132/84 | HR 69 | Ht 60.5 in | Wt 202.0 lb

## 2023-07-07 DIAGNOSIS — Z951 Presence of aortocoronary bypass graft: Secondary | ICD-10-CM | POA: Diagnosis not present

## 2023-07-07 DIAGNOSIS — I10 Essential (primary) hypertension: Secondary | ICD-10-CM

## 2023-07-07 DIAGNOSIS — E66812 Obesity, class 2: Secondary | ICD-10-CM

## 2023-07-07 DIAGNOSIS — I2510 Atherosclerotic heart disease of native coronary artery without angina pectoris: Secondary | ICD-10-CM | POA: Diagnosis not present

## 2023-07-07 DIAGNOSIS — E119 Type 2 diabetes mellitus without complications: Secondary | ICD-10-CM

## 2023-07-07 DIAGNOSIS — G4733 Obstructive sleep apnea (adult) (pediatric): Secondary | ICD-10-CM | POA: Diagnosis not present

## 2023-07-07 DIAGNOSIS — F419 Anxiety disorder, unspecified: Secondary | ICD-10-CM

## 2023-07-07 DIAGNOSIS — E782 Mixed hyperlipidemia: Secondary | ICD-10-CM | POA: Diagnosis not present

## 2023-07-07 NOTE — Progress Notes (Signed)
Cardiology Office Note    Date:  07/17/2023   ID:  Danielle Ellis, DOB October 19, 1955, MRN [id number]  PCP:  Junie Spencer, FNP  Cardiologist:  Nicki Guadalajara, MD (sleep); Dr Peter Swaziland  12 month F/U  sleep evaluation  History of Present Illness:  Danielle Ellis is a 67 y.o. female who is followed by Dr. Peter Swaziland for cardiology care.  I saw her for initial sleep evaluation on November 17, 2021 and last saw her on July 12, 2022.  She presents for 1 year evaluation.  Ms Bunts has a history of hypertension, type 2 diabetes mellitus, hyperlipidemia, and known CAD.  She suffered a non-STEMI on October 09, 2020 and due to multivessel CAD underwent CABG revascularization x5 on October 14, 2020 with a LIMA to LAD, SVG to diagonal, sequential SVG to OM1 and OM 2, and SVG to PDA by Dr. Evelene Croon.  Due to concerns for obstructive sleep apnea, she was referred for a initial sleep study which was done at [facility] sleep lab and on Jan 08, 2021.  She was found to have severe obstructive sleep apnea during the diagnostic portion of the study with an AHI of 30.7.  She had severe oxygen desaturation to a nadir of 77%.  CPAP was implemented but was suboptimal.  She subsequently underwent a BiPAP titration evaluation on February 10, 2021 and was titrated to 16 over 12 cm of water with residual AHI at 1.3/h and O2 nadir at 92%.  She received her ResMed AirCurve 10 VAuto BiPAP unit on February 26, 2021.  She was seen by Micah Flesher, PA on May 05, 2021 and was 100% compliant, feeling significantly improved with BiPAP institution.  She was unaware of breakthrough snoring and that significantly reduce daytime fatigue.  I saw her for my initial sleep evaluation on November 17, 2021.  At that tme, she informed me that years ago she was originally diagnosed with obstructive sleep apnea when she was being followed by Dr. Samuel Jester.  She used CPAP for approximately 2 years and then discontinued therapy over  6 years ago.  Presently, following reinstitution of BiPAP therapy, she feels significantly better and is tolerating BiPAP. She has had some issues with the humidification level.  And download was obtained in the office today from March 5 through November 16, 2021 which confirms 100% use.  Average use is 8 hours and 3 minutes.  At her pressure setting of 16/12, AHI is excellent at 0.7.  There is no significant mask leak.  An Epworth Sleepiness Scale score was calculated in the office today and this endorsed at 4 arguing against residual daytime sleepiness.  That evaluation, I had an extensive discussion with her regarding sleep apnea and its effects on normal sleep architecture as well as potential adverse cardiovascular consequences if left untreated. She was on losartan 100 mg and metoprolol succinate 50 mg daily for hypertension.  For optimal LDL lowering she was now on low-dose rosuvastatin 10 mg with Repatha 140 mg every 2-week injection.  She was on Lexapro for anxiety.   I last saw her on July 12, 2022.  She was continuing to use BiPAP therapy.  She has been started on semaglutide and has had 20 pounds of weight loss.  She has been sleeping approximately 8 hours per night and admits to excellent compliance with therapy.  A download was obtained from October 29 through July 12, 2022.  Average use is 7  hours and 45 minutes.  Her pressure setting is 16/12 and AHI is 1.2.  She is unaware of any breakthrough snoring.  She denies any nocturia.  She continues to be on amlodipine 5 mg, losartan 100 mg, metoprolol succinate 100 mg daily for hypertension.  She continues to be on Repatha and rosuvastatin.  She has been on semaglutide but she also tells me her primary physician darted Ozempic which is the same drug.    Since I last saw her, she has seen Dr. Peter Swaziland on May 26, 2023 for cardiology follow-up.  She has remained stable without chest pain, dyspnea, palpitations dizziness or edema.  Presently,  from a sleep perspective she is doing well.  She continues to use CPAP.  A download was obtained from October 21 through July 05, 2023 which confirms 100% compliance.  Average use is 7 hours and 35 minutes.  Her ResMed air curve 10 via auto BiPAP unit is set at an IPAP pressure of 16 and EPAP pressure of 12.  AHI is excellent at 1.3.  There is no mask leak.  She typically goes to bed between 9 and 930 and wakes up between 730 or 8.  At times she notes that her mouth is dry particular when she wakes up.  She presents for yearly evaluation.  Past Medical History:  Diagnosis Date   Anxiety    Hypertension    Sleep apnea     Past Surgical History:  Procedure Laterality Date   APPENDECTOMY     CORONARY ARTERY BYPASS GRAFT N/A 10/14/2020   Procedure: CORONARY ARTERY BYPASS GRAFTING (CABG) X FIVE, USING LEFT INTERNAL MAMMARY ARTERY AND BILATERAL LEGS GREATER SAPHENOUS VEINS HARVESTED ENDOSCOPICALLY;  Surgeon: Alleen Borne, MD;  Location: [facility] OR;  Service: Open Heart Surgery;  Laterality: N/A;   L ankle surgery     LEFT HEART CATH AND CORONARY ANGIOGRAPHY N/A 10/10/2020   Procedure: LEFT HEART CATH AND CORONARY ANGIOGRAPHY;  Surgeon: Swaziland, Peter M, MD;  Location: [facility] INVASIVE CV LAB;  Service: Cardiovascular;  Laterality: N/A;   TEE WITHOUT CARDIOVERSION N/A 10/14/2020   Procedure: TRANSESOPHAGEAL ECHOCARDIOGRAM (TEE);  Surgeon: Alleen Borne, MD;  Location[facility] OR;  Service: Open Heart Surgery;  Laterality: N/A;    Current Medications: Outpatient Medications Prior to Visit  Medication Sig Dispense Refill   amLODipine (NORVASC) 5 MG tablet Take 1 tablet (5 mg total) by mouth daily. 90 tablet 2   baclofen (LIORESAL) 10 MG tablet Take 1 tablet (10 mg total) by mouth 3 (three) times daily. 30 each 0   blood glucose meter kit and supplies Dispense based on patient and insurance preference. Use up to four times daily as directed. (FOR ICD-10 E10.9, E11.9). 1 each 0   diclofenac (VOLTAREN) 75 MG EC  tablet Take 1 tablet (75 mg total) by mouth 2 (two) times daily. 60 tablet 2   escitalopram (LEXAPRO) 10 MG tablet Take 1 tablet (10 mg total) by mouth daily. 90 tablet 2   Evolocumab (REPATHA SURECLICK) 140 MG/ML SOAJ INJECT 140 MG INTO THE SKIN EVERY 14 (FOURTEEN) DAYS. 6 mL 3   fluticasone (FLONASE) 50 MCG/ACT nasal spray Place 1 spray into both nostrils daily. 48 g 1   glucose blood (ONETOUCH ULTRA) test strip Test blood sugar 4 times daily as directed. 400 strip 3   latanoprost (XALATAN) 0.005 % ophthalmic solution Place 1 drop into both eyes daily 2.5 mL 6   losartan (COZAAR) 100 MG tablet Take 1 tablet (100  mg total) by mouth daily. 90 tablet 3   metoprolol succinate (TOPROL-XL) 100 MG 24 hr tablet Take 1 tablet (100 mg total) by mouth daily. Take with or immediately following a meal. 90 tablet 3   rosuvastatin (CRESTOR) 10 MG tablet Take 0.5 tablets (5 mg total) by mouth daily. 45 tablet 3   Semaglutide, 2 MG/DOSE, 8 MG/3ML SOPN Inject 2 mg as directed once a week. 9 mL 2   No facility-administered medications prior to visit.     Allergies:   Patient has no known allergies.   Social History   Socioeconomic History   Marital status: Married    Spouse name: Not on file   Number of children: 1   Years of education: Not on file   Highest education level: 12th grade  Occupational History   Occupation: part time Therapist, sports    Comment: retired  Tobacco Use   Smoking status: Never   Smokeless tobacco: Never  Vaping Use   Vaping status: Never Used  Substance and Sexual Activity   Alcohol use: No   Drug use: No   Sexual activity: Yes    Partners: Male  Other Topics Concern   Not on file  Social History Narrative   Lives with her husband. They have one daughter, [name] - She lives 10 minutes away.   Social Determinants of Health   Financial Resource Strain: Medium Risk (06/16/2023)   Overall Financial Resource Strain (CARDIA)    Difficulty of Paying Living Expenses:  Somewhat hard  Food Insecurity: No Food Insecurity (06/16/2023)   Hunger Vital Sign    Worried About Running Out of Food in the Last Year: Never true    Ran Out of Food in the Last Year: Never true  Transportation Needs: No Transportation Needs (06/16/2023)   PRAPARE - Administrator, Civil Service (Medical): No    Lack of Transportation (Non-Medical): No  Physical Activity: Insufficiently Active (06/16/2023)   Exercise Vital Sign    Days of Exercise per Week: 5 days    Minutes of Exercise per Session: 20 min  Stress: No Stress Concern Present (06/16/2023)   [employer] of Occupational Health - Occupational Stress Questionnaire    Feeling of Stress : Only a little  Social Connections: Moderately Integrated (06/16/2023)   Social Connection and Isolation Panel [NHANES]    Frequency of Communication with Friends and Family: More than three times a week    Frequency of Social Gatherings with Friends and Family: More than three times a week    Attends Religious Services: More than 4 times per year    Active Member of [employer] or Organizations: No    Attends Banker Meetings: Never    Marital Status: Married    Socially she is married for 46 years.  She has 1 daughter who is a Engineer, civil (consulting).  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  Family History:  The patient's family history includes Cancer in her mother; Diabetes in her mother; Heart attack in her mother; Kidney disease in her mother.   ROS General: Negative; No fevers, chills, or night sweats;  HEENT: Negative; No changes in vision or hearing, sinus congestion, difficulty swallowing Pulmonary: Negative; No cough, wheezing, shortness of breath, hemoptysis Cardiovascular: No recurrent chest pain or angina GI: Negative; No nausea, vomiting, diarrhea, or abdominal pain GU: Negative; No dysuria, hematuria, or difficulty voiding Musculoskeletal: Negative; no myalgias, joint pain, or  weakness Hematologic/Oncology: Negative;  no easy bruising, bleeding Endocrine: Type 2 diabetes mellitus on metformin Neuro: Negative; no changes in balance, headaches Skin: Negative; No rashes or skin lesions Psychiatric: Anxiety Sleep: Negative; No snoring, daytime sleepiness, hypersomnolence, bruxism, restless legs, hypnogognic hallucinations, no cataplexy Other comprehensive 14 point system review is negative.   PHYSICAL EXAM:   VS:  BP 132/84   Pulse 69   Ht 5' 0.5" (1.537 m)   Wt 202 lb (91.6 kg)   SpO2 96%   BMI 38.80 kg/m     Blood pressure by me was 118/80  Wt Readings from Last 3 Encounters:  07/07/23 202 lb (91.6 kg)  06/17/23 203 lb 9.6 oz (92.4 kg)  05/26/23 204 lb (92.5 kg)    General: Alert, oriented, no distress.  Weight has slightly increased from 1 98-2 02 since my last evaluation. Skin: normal turgor, no rashes, warm and dry HEENT: Normocephalic, atraumatic. Pupils equal round and reactive to light; sclera anicteric; extraocular muscles intact;  Nose without nasal septal hypertrophy Mouth/Parynx benign; Mallinpatti scale 3/4 Neck: No JVD, no carotid bruits; normal carotid upstroke Lungs: clear to ausculatation and percussion; no wheezing or rales Chest wall: without tenderness to palpitation Heart: PMI not displaced, RRR, s1 s2 normal, 1/6 systolic murmur, no diastolic murmur, no rubs, gallops, thrills, or heaves Abdomen: soft, nontender; no hepatosplenomehaly, BS+; abdominal aorta nontender and not dilated by palpation. Back: no CVA tenderness Pulses 2+ Musculoskeletal: full range of motion, normal strength, no joint deformities Extremities: no clubbing cyanosis or edema, Homan's sign negative  Neurologic: grossly nonfocal; Cranial nerves grossly wnl Psychologic: Normal mood and affect     Studies/Labs Reviewed:   EKG Interpretation Date/Time:  Thursday July 07 2023 08:46:55 EST Ventricular Rate:  69 PR Interval:  178 QRS Duration:  80 QT  Interval:  396 QTC Calculation: 424 R Axis:   18  Text Interpretation: Normal sinus rhythm Septal infarct , age undetermined When compared with ECG of 15-Oct-2020 07:04, Septal infarct is now Present Confirmed by Nicki Guadalajara ([id number]) on 07/07/2023 9:45:17 AM    July 12, 2022 ECG (independently read by me): NSR at 73; QS V1-3, no ectopy  I personally reviewed her ECG from September 08, 2021: Normal sinus rhythm at 76, QS V1 and V2.  No ectopy.  Recent Labs:    Latest Ref Rng & Units 06/17/2023    9:19 AM 03/17/2023   11:04 AM 10/15/2022   10:43 AM  BMP  Glucose 70 - 99 mg/dL 83  85  84   BUN 8 - 27 mg/dL 16  12  15    Creatinine 0.57 - 1.00 mg/dL 9.14  7.82  9.56   BUN/Creat Ratio 12 - 28 15  14  16    Sodium 134 - 144 mmol/L 144  141  142   Potassium 3.5 - 5.2 mmol/L 4.2  4.5  4.2   Chloride 96 - 106 mmol/L 104  103  104   CO2 20 - 29 mmol/L 22  24  21    Calcium 8.7 - 10.3 mg/dL 9.6  9.8  9.3         Latest Ref Rng & Units 06/17/2023    9:19 AM 03/17/2023   11:04 AM 10/15/2022   10:43 AM  Hepatic Function  Total Protein 6.0 - 8.5 g/dL 6.9  7.1  6.9   Albumin 3.9 - 4.9 g/dL 4.6  4.6  4.7   AST 0 - 40 IU/L 29  46  56   ALT 0 - 32  IU/L 69  86  93   Alk Phosphatase 44 - 121 IU/L 185  190  181   Total Bilirubin 0.0 - 1.2 mg/dL 0.5  0.5  0.5        Latest Ref Rng & Units 03/17/2023   11:04 AM 02/11/2022   10:30 AM 02/02/2021   10:45 AM  CBC  WBC 3.4 - 10.8 x10E3/uL 5.3  5.3  6.0   Hemoglobin 11.1 - 15.9 g/dL 13.2  44.0  10.2   Hematocrit 34.0 - 46.6 % 43.2  40.7  43.8   Platelets 150 - 450 x10E3/uL 205  167  210    Lab Results  Component Value Date   MCV 88 03/17/2023   MCV 85 02/11/2022   MCV 83 02/02/2021   Lab Results  Component Value Date   TSH 2.360 03/17/2023   Lab Results  Component Value Date   HGBA1C 5.5 06/17/2023     BNP No results found for: "BNP"  ProBNP No results found for: "PROBNP"   Lipid Panel     Component Value Date/Time   CHOL 142  03/17/2023 1104   TRIG 235 (H) 03/17/2023 1104   HDL 45 03/17/2023 1104   CHOLHDL 3.2 03/17/2023 1104   CHOLHDL 8.2 10/10/2020 0144   VLDL 57 (H) 10/10/2020 0144   LDLCALC 59 03/17/2023 1104   LABVLDL 38 03/17/2023 1104     RADIOLOGY: No results found.   Additional studies/ records that were reviewed today include:                                           [facility]                                          Patient Name: Carly, Roady Date: 02/10/2021 Gender: Female D.O.B: [birth year] Age (years): 37 Referring Provider: Edd Fabian NP Height (inches): 61 Interpreting Physician: Nicki Guadalajara MD, ABSM Weight (lbs): 196 RPSGT: Alfonso Ellis BMI: 37 MRN: [id number] Neck Size: 15.00   CLINICAL INFORMATION The patient is referred for a BiPAP titration to treat sleep apnea.   Date of Split Night:  01/08/2021:  AHI 30.7/h; REM AHI 52.2/h; supine sleep AHI 86.2/h; O2 nadir 77%.   SLEEP STUDY TECHNIQUE As per the AASM Manual for the Scoring of Sleep and Associated Events v2.3 (April 2016) with a hypopnea requiring 4% desaturations.   The channels recorded and monitored were frontal, central and occipital EEG, electrooculogram (EOG), submentalis EMG (chin), nasal and oral airflow, thoracic and abdominal wall motion, anterior tibialis EMG, snore microphone, electrocardiogram, and pulse oximetry. Bilevel positive airway pressure (BPAP) was initiated at the beginning of the study and titrated to treat sleep-disordered breathing.   MEDICATIONS acetaminophen (TYLENOL) 325 MG tablet aspirin EC 81 MG tablet blood glucose meter kit and supplies calcium gluconate 500 MG tablet clopidogrel (PLAVIX) 75 MG tablet escitalopram (LEXAPRO) 10 MG tablet fluticasone (FLONASE) 50 MCG/ACT nasal spray glucose blood (ONETOUCH ULTRA) test strip latanoprost (XALATAN) 0.005 % ophthalmic solution loratadine (CLARITIN) 10 MG tablet losartan (COZAAR) 50 MG tablet metFORMIN (GLUCOPHAGE)  500 MG tablet metoprolol succinate (TOPROL-XL) 50 MG 24 hr tablet Multiple Vitamin (MULTIVITAMIN) capsule rosuvastatin (CRESTOR) 10 MG tablet valACYclovir (VALTREX) 1000 MG tablet Medications self-administered by patient taken the night of the  study : N/A   RESPIRATORY PARAMETERS Optimal IPAP Pressure (cm): 16        AHI at Optimal Pressure (/hr) 1.3 Optimal EPAP Pressure (cm):            12           Overall Minimal O2 (%):         88.00   Minimal O2 at Optimal Pressure (%): 92.0   SLEEP ARCHITECTURE Start Time:      11:01:39 PM    Stop Time:       5:54:58 AM      Total Time (min):         413.3   Total Sleep Time (min):      300 Sleep Latency (min):   55.3     Sleep Efficiency (%):   72.6     REM Latency (min):    117.5   WASO (min):  58.1 Stage N1 (%): 3.83     Stage N2 (%): 34.50   Stage N3 (%): 37.50   Stage R (%):   24.2 Supine (%):     29.83   Arousal Index (/hr):     5.2          CARDIAC DATA The 2 lead EKG demonstrated sinus rhythm. The mean heart rate was 51.83 beats per minute. Other EKG findings include: PVCs.   LEG MOVEMENT DATA The total Periodic Limb Movements of Sleep (PLMS) were 37. The PLMS index was 7.40. A PLMS index of <15 is considered normal in adults.   IMPRESSIONS - BiPAP was initiated at 8/4 and was titrated to optimal PAP pressure at 16 /12  cm of water); AHI 1.3/h; O2 nadir 92%. - Central sleep apnea was not noted during this titration (CAI = 2.8/h). - Mild oxygen desaturations to a nadir of 88.00% at 13/9 cm of water.  - The patient snored with moderate snoring volume. - 2-lead EKG demonstrated: PVCs - Mild periodic limb movements were observed during this study. Arousals associated with PLMs were rare.   DIAGNOSIS - Obstructive Sleep Apnea (G47.33)   RECOMMENDATIONS - Recommend an initial trial of BiPAP therapy with EPR at 16/12 cm H2O with heated humidification.  A small size Philips Respironics Full Face Mask Dreamwear mask was used for the  titration study.  - Effort should be made to optimize nasal and oropharyngeal patency. - Avoid alcohol, sedatives and other CNS depressants that may worsen sleep apnea and disrupt normal sleep architecture. - Sleep hygiene should be reviewed to assess factors that may improve sleep quality. - Weight management (BMI 37) and regular exercise should be initiated or continued. - Recommend a download in 30 days and sleep clinic evaluation after 4 weeks of therapy.    ASSESSMENT:    1. Obstructive sleep apnea (adult) (pediatric)   2. Coronary artery disease involving native coronary artery of native heart without angina pectoris   3. S/P CABG x 5   4. Essential hypertension   5. Type 2 diabetes mellitus without complication, without long-term current use of insulin (HCC)   6. Obesity, Class II, BMI 35-39.9   7. Anxiety   8. Mixed hyperlipidemia     PLAN:  [name] is a 67 year old female who has a history of hypertension, hyperlipidemia, diabetes mellitus, generalized anxiety disorder, and following a non-STEMI was found to have significant multivessel coronary artery disease.  She underwent successful CABG surgery x5 on October 14, 2020 by Dr. Laneta Simmers and cardiovascularly has remained  relatively stable.  Remotely, she had been diagnosed with obstructive sleep apnea when cared for by Dr. Samuel Jester and states that she may have been on CPAP therapy for 2 years but ultimately stopped treatment over 6 years ago.  She underwent a new sleep evaluation in May and June 2022 and was found to have severe obstructive sleep apnea with an overall AHI of 30.7/h, and more severe sleep apnea during REM sleep with an AHI 52.2/h.  Episodes were very severe with supine sleep with an AHI of 86.2/h and significant oxygen desaturation to 77%.  She did not do well with CPAP and ultimately required BiPAP titration.  She received a new ResMed air curve 10 via auto BiPAP unit on February 26, 2021.  Since initiating  BiPAP therapy she has felt significantly improved both with sleep quality as well as energy and she has not had any recurrence of snoring or frequent nocturia.  At her initial evaluation, I had an extensive discussion with her regarding potential adverse consequences of untreated sleep apnea particularly as it relates to her cardiovascular health.  Over the past year, she has continued to use CPAP with 100% compliance.  Average use is 7 hours 35 minutes.  Her BiPAP is set at 16/12 and AHI is excellent at 1.3.  With her dry mouth, I have recommended increasing her humidification level from 3 up to 4.  She typically has been going to bed between 9 and 930 and waking up around 7 30-8.  [facility] is her DME company.  BMI continues to be consistent with moderate obesity.  Her blood pressure today when rechecked by me was excellent at 118/80.  She continues to be on amlodipine 5 mg, losartan 100 mg, and metoprolol succinate 100 mg daily.  She is on rosuvastatin 5 mg and Repatha for lipid management.  Most recent LDL in March 17, 2023 was 59.  She is taking semaglutide weekly and most recent hemoglobin A1c was 5.5.  I discussed plans for future retirement.  I will see her as needed until I retire but following retirement she will need to be transitioned to Dr. Mayford Knife or a new sleep provider.  Medication Adjustments/Labs and Tests Ordered: Current medicines are reviewed at length with the patient today.  Concerns regarding medicines are outlined above.  Medication changes, Labs and Tests ordered today are listed in the Patient Instructions below. Patient Instructions  Medication Instructions:  No medication changes *If you need a refill on your cardiac medications before your next appointment, please call your pharmacy*   Lab Work: No labs  If you have labs (blood work) drawn today and your tests are completely normal, you will receive your results only by: MyChart Message (if you have MyChart) OR A  paper copy in the mail If you have any lab test that is abnormal or we need to change your treatment, we will call you to review the results.   Testing/Procedures: No labs ordered   Follow-Up: At [facility], you and your health needs are our priority.  As part of our continuing mission to provide you with exceptional heart care, we have created designated Provider Care Teams.  These Care Teams include your primary Cardiologist (physician) and Advanced Practice Providers (APPs -  Physician Assistants and Nurse Practitioners) who all work together to provide you with the care you need, when you need it.  We recommend signing up for the patient portal called "MyChart".  Sign up information is provided on  this After Visit Summary.  MyChart is used to connect with patients for Virtual Visits (Telemedicine).  Patients are able to view lab/test results, encounter notes, upcoming appointments, etc.  Non-urgent messages can be sent to your provider as well.   To learn more about what you can do with MyChart, go to [email or web address].    Your next appointment:    As needed for seep concerns  Provider:   Dr. Armanda Magic     Other Instructions If you have any questions or concerns regarding your c-pap, bi-pap or sleep accessories, please contact Brandie Rorie at [phone].       Signed, Nicki Guadalajara, MD, [facility], ABSM Diplomate, American Board of Sleep Medicine       07/17/2023 1:24 PM    [facility] [address], [address], [place], [place]  [address] Phone: [phone]

## 2023-07-07 NOTE — Patient Instructions (Signed)
Medication Instructions:  No medication changes *If you need a refill on your cardiac medications before your next appointment, please call your pharmacy*   Lab Work: No labs  If you have labs (blood work) drawn today and your tests are completely normal, you will receive your results only by: MyChart Message (if you have MyChart) OR A paper copy in the mail If you have any lab test that is abnormal or we need to change your treatment, we will call you to review the results.   Testing/Procedures: No labs ordered   Follow-Up: At [facility], you and your health needs are our priority.  As part of our continuing mission to provide you with exceptional heart care, we have created designated Provider Care Teams.  These Care Teams include your primary Cardiologist (physician) and Advanced Practice Providers (APPs -  Physician Assistants and Nurse Practitioners) who all work together to provide you with the care you need, when you need it.  We recommend signing up for the patient portal called "MyChart".  Sign up information is provided on this After Visit Summary.  MyChart is used to connect with patients for Virtual Visits (Telemedicine).  Patients are able to view lab/test results, encounter notes, upcoming appointments, etc.  Non-urgent messages can be sent to your provider as well.   To learn more about what you can do with MyChart, go to [email or web address].    Your next appointment:    As needed for seep concerns  Provider:   Dr. Armanda Magic     Other Instructions If you have any questions or concerns regarding your c-pap, bi-pap or sleep accessories, please contact Brandie Rorie at [phone].

## 2023-08-02 ENCOUNTER — Other Ambulatory Visit: Payer: Self-pay | Admitting: Family

## 2023-08-02 DIAGNOSIS — M546 Pain in thoracic spine: Secondary | ICD-10-CM

## 2023-08-15 ENCOUNTER — Telehealth: Payer: Self-pay

## 2023-08-15 DIAGNOSIS — G4733 Obstructive sleep apnea (adult) (pediatric): Secondary | ICD-10-CM | POA: Diagnosis not present

## 2023-08-15 NOTE — Telephone Encounter (Signed)
Copied from CRM [id number]. Topic: Clinical - Medication Question >> Aug 15, 2023 10:02 AM Danielle Ellis wrote: Reason for CRM: Pt states she is almost out of Ozempic and wants to know when  a refill will be available at the office for her to pick up

## 2023-08-16 NOTE — Progress Notes (Signed)
 Pharmacy Medication Assistance Program Note    08/16/2023  Patient ID: Danielle Ellis, female   DOB: 06/05/1956, 67 y.o.   MRN: [id number]     06/22/2023  Outreach Medication One  Manufacturer Medication One Novo Nordisk  Nordisk Drugs Ozempic   Dose of Ozempic  2mg   Type of Sport And Exercise Psychologist  Date Application Submitted to Manufacturer 06/22/2023  Patient Assistance Determination Approved  Approval End Date 08/11/2024

## 2023-08-19 ENCOUNTER — Other Ambulatory Visit (HOSPITAL_COMMUNITY): Payer: Self-pay

## 2023-08-27 ENCOUNTER — Other Ambulatory Visit: Payer: Self-pay | Admitting: Family

## 2023-08-27 ENCOUNTER — Other Ambulatory Visit (HOSPITAL_COMMUNITY): Payer: Self-pay

## 2023-08-27 DIAGNOSIS — F411 Generalized anxiety disorder: Secondary | ICD-10-CM

## 2023-08-27 DIAGNOSIS — F320 Major depressive disorder, single episode, mild: Secondary | ICD-10-CM

## 2023-08-28 MED ORDER — ESCITALOPRAM OXALATE 10 MG PO TABS
10.0000 mg | ORAL_TABLET | Freq: Every day | ORAL | 0 refills | Status: DC
  Filled 2023-08-28: qty 90, 90d supply, fill #0

## 2023-08-29 ENCOUNTER — Other Ambulatory Visit (HOSPITAL_COMMUNITY): Payer: Self-pay

## 2023-08-29 ENCOUNTER — Other Ambulatory Visit: Payer: Self-pay

## 2023-08-30 ENCOUNTER — Other Ambulatory Visit: Payer: Self-pay

## 2023-09-06 ENCOUNTER — Telehealth: Payer: Self-pay

## 2023-09-06 NOTE — Telephone Encounter (Signed)
Copied from CRM [id number]. Topic: Clinical - Prescription Issue >> Sep 06, 2023 11:54 AM Fuller Mandril wrote: Reason for CRM: Patient called to check status on Ozempic shipment she receives from patient assistance program. She states program renewal approved but she only has enough to last until Friday and not sure what to do if it doesn't come in by then. Per letter it should have been received 10-14 business days from 12/27. Patient reached out to PAP and they say it is in process but she says they have been saying that since last year. Thank You

## 2023-09-07 ENCOUNTER — Other Ambulatory Visit (HOSPITAL_COMMUNITY): Payer: Self-pay

## 2023-09-07 ENCOUNTER — Telehealth: Payer: Self-pay

## 2023-09-07 ENCOUNTER — Other Ambulatory Visit (HOSPITAL_BASED_OUTPATIENT_CLINIC_OR_DEPARTMENT_OTHER): Payer: Self-pay

## 2023-09-07 ENCOUNTER — Other Ambulatory Visit: Payer: Self-pay

## 2023-09-07 NOTE — Telephone Encounter (Signed)
Copied from CRM [id number]. Topic: Clinical - Prescription Issue >> Sep 07, 2023  3:15 PM Victorino Dike T wrote: Reason for CRM: Semaglutide,0.25 or 0.5MG /DOS, (OZEMPIC, 0.25 OR 0.5 MG/DOSE,) 2 MG/3ML SOPN have not received any call back about this please call patient [phone]

## 2023-09-19 ENCOUNTER — Telehealth: Payer: Self-pay | Admitting: *Deleted

## 2023-09-19 NOTE — Telephone Encounter (Signed)
Patient aware patient assistance Ozempic available for pick up.

## 2023-09-20 ENCOUNTER — Ambulatory Visit (INDEPENDENT_AMBULATORY_CARE_PROVIDER_SITE_OTHER): Payer: PPO | Admitting: Family

## 2023-09-20 ENCOUNTER — Encounter: Payer: Self-pay | Admitting: Family

## 2023-09-20 VITALS — BP 117/71 | HR 77 | Temp 96.9°F | Ht 61.5 in | Wt 203.4 lb

## 2023-09-20 DIAGNOSIS — Z7985 Long-term (current) use of injectable non-insulin antidiabetic drugs: Secondary | ICD-10-CM

## 2023-09-20 DIAGNOSIS — I252 Old myocardial infarction: Secondary | ICD-10-CM

## 2023-09-20 DIAGNOSIS — I2510 Atherosclerotic heart disease of native coronary artery without angina pectoris: Secondary | ICD-10-CM

## 2023-09-20 DIAGNOSIS — F411 Generalized anxiety disorder: Secondary | ICD-10-CM | POA: Diagnosis not present

## 2023-09-20 DIAGNOSIS — E1169 Type 2 diabetes mellitus with other specified complication: Secondary | ICD-10-CM | POA: Diagnosis not present

## 2023-09-20 DIAGNOSIS — F320 Major depressive disorder, single episode, mild: Secondary | ICD-10-CM | POA: Diagnosis not present

## 2023-09-20 DIAGNOSIS — I10 Essential (primary) hypertension: Secondary | ICD-10-CM | POA: Diagnosis not present

## 2023-09-20 DIAGNOSIS — Z951 Presence of aortocoronary bypass graft: Secondary | ICD-10-CM

## 2023-09-20 DIAGNOSIS — E782 Mixed hyperlipidemia: Secondary | ICD-10-CM

## 2023-09-20 LAB — BAYER DCA HB A1C WAIVED: HB A1C (BAYER DCA - WAIVED): 5.5 % (ref 4.8–5.6)

## 2023-09-20 NOTE — Patient Instructions (Signed)
 Health Maintenance After Age 70 After age 27, you are at a higher risk for certain long-term diseases and infections as well as injuries from falls. Falls are a major cause of broken bones and head injuries in people who are older than age 73. Getting regular preventive care can help to keep you healthy and well. Preventive care includes getting regular testing and making lifestyle changes as recommended by your health care provider. Talk with your health care provider about: Which screenings and tests you should have. A screening is a test that checks for a disease when you have no symptoms. A diet and exercise plan that is right for you. What should I know about screenings and tests to prevent falls? Screening and testing are the best ways to find a health problem early. Early diagnosis and treatment give you the best chance of managing medical conditions that are common after age 90. Certain conditions and lifestyle choices may make you more likely to have a fall. Your health care provider may recommend: Regular vision checks. Poor vision and conditions such as cataracts can make you more likely to have a fall. If you wear glasses, make sure to get your prescription updated if your vision changes. Medicine review. Work with your health care provider to regularly review all of the medicines you are taking, including over-the-counter medicines. Ask your health care provider about any side effects that may make you more likely to have a fall. Tell your health care provider if any medicines that you take make you feel dizzy or sleepy. Strength and balance checks. Your health care provider may recommend certain tests to check your strength and balance while standing, walking, or changing positions. Foot health exam. Foot pain and numbness, as well as not wearing proper footwear, can make you more likely to have a fall. Screenings, including: Osteoporosis screening. Osteoporosis is a condition that causes  the bones to get weaker and break more easily. Blood pressure screening. Blood pressure changes and medicines to control blood pressure can make you feel dizzy. Depression screening. You may be more likely to have a fall if you have a fear of falling, feel depressed, or feel unable to do activities that you used to do. Alcohol  use screening. Using too much alcohol  can affect your balance and may make you more likely to have a fall. Follow these instructions at home: Lifestyle Do not drink alcohol  if: Your health care provider tells you not to drink. If you drink alcohol : Limit how much you have to: 0-1 drink a day for women. 0-2 drinks a day for men. Know how much alcohol  is in your drink. In the [place] one drink equals one 12 oz bottle of beer (355 mL), one 5 oz glass of wine (148 mL), or one 1 oz glass of hard liquor (44 mL). Do not use any products that contain nicotine or tobacco. These products include cigarettes, chewing tobacco, and vaping devices, such as e-cigarettes. If you need help quitting, ask your health care provider. Activity  Follow a regular exercise program to stay fit. This will help you maintain your balance. Ask your health care provider what types of exercise are appropriate for you. If you need a cane or walker, use it as recommended by your health care provider. Wear supportive shoes that have nonskid soles. Safety  Remove any tripping hazards, such as rugs, cords, and clutter. Install safety equipment such as grab bars in bathrooms and safety rails on stairs. Keep rooms and walkways  well-lit. General instructions Talk with your health care provider about your risks for falling. Tell your health care provider if: You fall. Be sure to tell your health care provider about all falls, even ones that seem minor. You feel dizzy, tiredness (fatigue), or off-balance. Take over-the-counter and prescription medicines only as told by your health care provider. These include  supplements. Eat a healthy diet and maintain a healthy weight. A healthy diet includes low-fat dairy products, low-fat (lean) meats, and fiber from whole grains, beans, and lots of fruits and vegetables. Stay current with your vaccines. Schedule regular health, dental, and eye exams. Summary Having a healthy lifestyle and getting preventive care can help to protect your health and wellness after age 15. Screening and testing are the best way to find a health problem early and help you avoid having a fall. Early diagnosis and treatment give you the best chance for managing medical conditions that are more common for people who are older than age 42. Falls are a major cause of broken bones and head injuries in people who are older than age 64. Take precautions to prevent a fall at home. Work with your health care provider to learn what changes you can make to improve your health and wellness and to prevent falls. This information is not intended to replace advice given to you by your health care provider. Make sure you discuss any questions you have with your health care provider. Document Revised: 12/22/2020 Document Reviewed: 12/22/2020 Elsevier Patient Education  2024 [employer].

## 2023-09-20 NOTE — Progress Notes (Signed)
 Subjective:    Patient ID: Danielle Ellis, female    DOB: 1955-08-30, 68 y.o.   MRN: [id number]  Chief Complaint  Patient presents with   Follow-up    Pt present to the office today chronic follow up. She is followed by Cardiologists every 6 months  for NSTEMI on 10/09/20.    She was started Ozempic  2 mg. She had lost 12 lbs. She is morbid obese with a BMI of 37 with HTN and DM.      09/20/2023    8:30 AM 07/07/2023    8:45 AM 06/17/2023    8:36 AM  Last 3 Weights  Weight (lbs) 203 lb 6.4 oz 202 lb 203 lb 9.6 oz  Weight (kg) 92.262 kg 91.627 kg 92.352 kg     Hypertension This is a chronic problem. The current episode started more than 1 year ago. The problem has been resolved since onset. The problem is controlled. Associated symptoms include anxiety. Pertinent negatives include no blurred vision, malaise/fatigue, peripheral edema or shortness of breath. Risk factors for coronary artery disease include dyslipidemia, diabetes mellitus, obesity and sedentary lifestyle. The current treatment provides moderate improvement.  Diabetes She presents for her follow-up diabetic visit. She has type 2 diabetes mellitus. Hypoglycemia symptoms include nervousness/anxiousness. Pertinent negatives for diabetes include no blurred vision, no fatigue and no foot paresthesias. Symptoms are stable. Risk factors for coronary artery disease include dyslipidemia, diabetes mellitus, hypertension and sedentary lifestyle. She is following a generally healthy diet. Her overall blood glucose range is 90-110 mg/dl. Eye exam is current.  Hyperlipidemia This is a chronic problem. The current episode started more than 1 year ago. The problem is controlled. Recent lipid tests were reviewed and are normal. Exacerbating diseases include obesity. Pertinent negatives include no shortness of breath. Current antihyperlipidemic treatment includes statins. The current treatment provides moderate improvement of lipids. Risk  factors for coronary artery disease include diabetes mellitus, hypertension, a sedentary lifestyle, post-menopausal, dyslipidemia and obesity.  Depression        This is a chronic problem.  The current episode started more than 1 year ago.   The problem occurs intermittently.  Associated symptoms include sad.  Associated symptoms include no fatigue, no helplessness and no hopelessness.  Past treatments include SSRIs - Selective serotonin reuptake inhibitors.  Past medical history includes anxiety.   Anxiety Presents for follow-up visit. Symptoms include excessive worry and nervous/anxious behavior. Patient reports no hyperventilation or shortness of breath. Symptoms occur occasionally.        Review of Systems  Constitutional:  Negative for fatigue and malaise/fatigue.  Eyes:  Negative for blurred vision.  Respiratory:  Negative for shortness of breath.   Psychiatric/Behavioral:  The patient is nervous/anxious.   All other systems reviewed and are negative.      Objective:   Physical Exam Vitals reviewed.  Constitutional:      General: She is not in acute distress.    Appearance: She is well-developed. She is obese.  HENT:     Head: Normocephalic and atraumatic.     Right Ear: Tympanic membrane normal.     Left Ear: Tympanic membrane normal.  Eyes:     Pupils: Pupils are equal, round, and reactive to light.  Neck:     Thyroid : No thyromegaly.  Cardiovascular:     Rate and Rhythm: Normal rate and regular rhythm.     Heart sounds: Normal heart sounds. No murmur heard. Pulmonary:     Effort: Pulmonary effort is normal.  No respiratory distress.     Breath sounds: Normal breath sounds. No wheezing.  Abdominal:     General: Bowel sounds are normal. There is no distension.     Palpations: Abdomen is soft.     Tenderness: There is no abdominal tenderness.  Musculoskeletal:        General: No tenderness. Normal range of motion.     Cervical back: Normal range of motion and neck  supple.  Skin:    General: Skin is warm and dry.  Neurological:     Mental Status: She is alert and oriented to person, place, and time.     Cranial Nerves: No cranial nerve deficit.     Deep Tendon Reflexes: Reflexes are normal and symmetric.  Psychiatric:        Behavior: Behavior normal.        Thought Content: Thought content normal.        Judgment: Judgment normal.       BP 117/71   Pulse 77   Temp (!) 96.9 F (36.1 C)   Ht 5' 1.5 (1.562 m)   Wt 203 lb 6.4 oz (92.3 kg)   SpO2 95%   BMI 37.81 kg/m      Assessment & Plan:  Danielle Ellis comes in today with chief complaint of Follow-up   Diagnosis and orders addressed:  1. Coronary artery disease involving native coronary artery of native heart without angina pectoris - CMP14+EGFR  2. Depression, major, single episode, mild (HCC) - CMP14+EGFR  3. Type 2 diabetes mellitus with other specified complication, without long-term current use of insulin  (HCC) (Primary) - CMP14+EGFR - Bayer DCA Hb A1c Waived  4. GAD (generalized anxiety disorder) - CMP14+EGFR  5. History of non-ST elevation myocardial infarction (NSTEMI) - CMP14+EGFR  6. Mixed hyperlipidemia - CMP14+EGFR  7. Hypertension, unspecified type - CMP14+EGFR  8. Morbid obesity (HCC) - CMP14+EGFR  9. S/P CABG x 5 - CMP14+EGFR   Labs pending Continue current medications  Health Maintenance reviewed Diet and exercise encouraged  Follow up plan: 4 months    Bari Learn, FNP

## 2023-09-21 LAB — CMP14+EGFR
ALT: 78 IU/L — ABNORMAL HIGH (ref 0–32)
AST: 35 IU/L (ref 0–40)
Albumin: 4.7 g/dL (ref 3.9–4.9)
Alkaline Phosphatase: 175 IU/L — ABNORMAL HIGH (ref 44–121)
BUN/Creatinine Ratio: 13 (ref 12–28)
BUN: 12 mg/dL (ref 8–27)
Bilirubin Total: 0.5 mg/dL (ref 0.0–1.2)
CO2: 23 mmol/L (ref 20–29)
Calcium: 9.6 mg/dL (ref 8.7–10.3)
Chloride: 105 mmol/L (ref 96–106)
Creatinine, Ser: 0.9 mg/dL (ref 0.57–1.00)
Globulin, Total: 2.3 g/dL (ref 1.5–4.5)
Glucose: 97 mg/dL (ref 70–99)
Potassium: 4.3 mmol/L (ref 3.5–5.2)
Sodium: 144 mmol/L (ref 134–144)
Total Protein: 7 g/dL (ref 6.0–8.5)
eGFR: 69 mL/min/{1.73_m2} (ref >59)

## 2023-09-22 ENCOUNTER — Other Ambulatory Visit: Payer: Self-pay | Admitting: Family

## 2023-09-22 DIAGNOSIS — K862 Cyst of pancreas: Secondary | ICD-10-CM

## 2023-09-22 DIAGNOSIS — R748 Abnormal levels of other serum enzymes: Secondary | ICD-10-CM

## 2023-09-26 ENCOUNTER — Encounter (INDEPENDENT_AMBULATORY_CARE_PROVIDER_SITE_OTHER): Payer: Self-pay | Admitting: *Deleted

## 2023-09-30 ENCOUNTER — Other Ambulatory Visit: Payer: Self-pay | Admitting: Family

## 2023-09-30 ENCOUNTER — Other Ambulatory Visit: Payer: Self-pay

## 2023-09-30 ENCOUNTER — Other Ambulatory Visit (HOSPITAL_COMMUNITY): Payer: Self-pay

## 2023-09-30 MED ORDER — VALACYCLOVIR HCL 1 G PO TABS
1000.0000 mg | ORAL_TABLET | Freq: Two times a day (BID) | ORAL | 2 refills | Status: DC
  Filled 2023-09-30: qty 60, 30d supply, fill #0

## 2023-10-03 ENCOUNTER — Other Ambulatory Visit: Payer: Self-pay

## 2023-10-03 ENCOUNTER — Other Ambulatory Visit (HOSPITAL_COMMUNITY): Payer: Self-pay

## 2023-10-05 ENCOUNTER — Other Ambulatory Visit: Payer: Self-pay

## 2023-10-06 ENCOUNTER — Other Ambulatory Visit (HOSPITAL_COMMUNITY): Payer: Self-pay

## 2023-10-06 ENCOUNTER — Other Ambulatory Visit: Payer: Self-pay

## 2023-10-11 ENCOUNTER — Other Ambulatory Visit (HOSPITAL_COMMUNITY): Payer: Self-pay

## 2023-10-11 ENCOUNTER — Telehealth: Payer: Self-pay | Admitting: Pharmacist

## 2023-10-11 NOTE — Telephone Encounter (Signed)
 Faxed replacement Repatha Rx to [facility]

## 2023-10-17 ENCOUNTER — Encounter: Payer: Self-pay | Admitting: *Deleted

## 2023-10-20 ENCOUNTER — Ambulatory Visit (INDEPENDENT_AMBULATORY_CARE_PROVIDER_SITE_OTHER): Payer: PPO | Admitting: Gastroenterology

## 2023-10-20 ENCOUNTER — Encounter (INDEPENDENT_AMBULATORY_CARE_PROVIDER_SITE_OTHER): Payer: Self-pay | Admitting: Gastroenterology

## 2023-10-20 VITALS — BP 121/72 | HR 75 | Temp 96.9°F | Ht 62.0 in | Wt 203.4 lb

## 2023-10-20 DIAGNOSIS — Z1159 Encounter for screening for other viral diseases: Secondary | ICD-10-CM | POA: Diagnosis not present

## 2023-10-20 DIAGNOSIS — R7401 Elevation of levels of liver transaminase levels: Secondary | ICD-10-CM

## 2023-10-20 DIAGNOSIS — K862 Cyst of pancreas: Secondary | ICD-10-CM | POA: Diagnosis not present

## 2023-10-20 DIAGNOSIS — R748 Abnormal levels of other serum enzymes: Secondary | ICD-10-CM

## 2023-10-20 DIAGNOSIS — R7989 Other specified abnormal findings of blood chemistry: Secondary | ICD-10-CM | POA: Diagnosis not present

## 2023-10-20 NOTE — Patient Instructions (Addendum)
 Perform blood workup Repeat MRCP in 03/2024 Explained to the patient the etiology and consequences of his current liver disease. Patient was counseled about the benefit of implementing a Mediterranean diet and exercise plan to decrease at least 5% of weight. The patient understood about the importance of lifestyle changes to potentially reverse his liver involvement.

## 2023-10-20 NOTE — Progress Notes (Signed)
 Danielle Ellis, M.D. Gastroenterology & Hepatology [facility] [address] [place], [place] [address] Primary Care Physician: Junie Spencer, FNP [address] [place] [address]  Referring MD: PCP  Chief Complaint:  elevated LFTs  History of Present Illness: Danielle Ellis is a 68 y.o. female with PMH anxiety, CAD s/p CABG, OSA, HTN, DM, who presents for evaluation of elevated LFTs.  Patient denies having any complaints. The patient denies having any nausea, vomiting, fever, chills, hematochezia, melena, hematemesis, abdominal distention, abdominal pain, diarrhea, jaundice, pruritus . Has had difficulty losing weight, tries mostly eating baked but does not follow a very specific diet.  Has been on semaglutide chronically. No recent new medications. Does not take any herbs or supplements.  Has had mildly elevated aminotransferases and alk phos since at least 2021.  Most recent labs in 09/20/2023 showed alkaline phosphatase 175, AST 35, ALT 78, total bilirubin 0.5, creatinine 0.9, sodium 144, potassium 4.3, albumin 4.7.  Imaging: Ultrasound of the right upper quadrant on 10/29/2022 showed normal liver, no presence of cholelithiasis, CBD measured 4.2 mm.  Underwent a subsequent MRCP on 04/08/2023 that showed presence of multiple nonenhancing cystic lesions in the uncinate process measuring up to 6 x 11 mm without any internal abnormalities.  Lesions were considered to be secondary to IPMN.  Last PPI:RJJOA Last Colonoscopy:07/29/2025 - diverticulosis and internal hemorrhoids  FHx: brother had liver cirrhosis due to EtOH, neg for any gastrointestinal/liver disease, mother had ovarian cancer Social: neg smoking, alcohol or illicit drug use Surgical: appendectomy  Past Medical History: Past Medical History:  Diagnosis Date   Anxiety    Hypertension    Sleep apnea     Past Surgical History: Past Surgical History:  Procedure  Laterality Date   APPENDECTOMY     CORONARY ARTERY BYPASS GRAFT N/A 10/14/2020   Procedure: CORONARY ARTERY BYPASS GRAFTING (CABG) X FIVE, USING LEFT INTERNAL MAMMARY ARTERY AND BILATERAL LEGS GREATER SAPHENOUS VEINS HARVESTED ENDOSCOPICALLY;  Surgeon: Alleen Borne, MD;  Location: [facility] OR;  Service: Open Heart Surgery;  Laterality: N/A;   L ankle surgery     LEFT HEART CATH AND CORONARY ANGIOGRAPHY N/A 10/10/2020   Procedure: LEFT HEART CATH AND CORONARY ANGIOGRAPHY;  Surgeon: Swazil[place], Peter M, MD;  Location: [facility] INVASIVE CV LAB;  Service: Cardiovascular;  Laterality: N/A;   TEE WITHOUT CARDIOVERSION N/A 10/14/2020   Procedure: TRANSESOPHAGEAL ECHOCARDIOGRAM (TEE);  Surgeon: Alleen Borne, MD;  Location: [facility] OR;  Service: Open Heart Surgery;  Laterality: N/A;    Family History: Family History  Problem Relation Age of Onset   Diabetes Mother    Kidney disease Mother    Cancer Mother    Heart attack Mother     Social History: Social History   Tobacco Use  Smoking Status Never  Smokeless Tobacco Never   Social History   Substance and Sexual Activity  Alcohol Use No   Social History   Substance and Sexual Activity  Drug Use No    Allergies: No Known Allergies  Medications: Current Outpatient Medications  Medication Sig Dispense Refill   amLODipine (NORVASC) 5 MG tablet Take 1 tablet (5 mg total) by mouth daily. 90 tablet 2   baclofen (LIORESAL) 10 MG tablet Take 1 tablet (10 mg total) by mouth 3 (three) times daily. 30 each 0   blood glucose meter kit and supplies Dispense based on patient and insurance preference. Use up to four times daily as directed. (FOR ICD-10 E10.9,  E11.9). 1 each 0   diclofenac (VOLTAREN) 75 MG EC tablet TAKE 1 TABLET BY MOUTH TWICE A DAY 60 tablet 1   escitalopram (LEXAPRO) 10 MG tablet Take 1 tablet (10 mg total) by mouth daily. 90 tablet 0   Evolocumab (REPATHA SURECLICK) 140 MG/ML SOAJ INJECT 140 MG INTO THE SKIN EVERY 14 (FOURTEEN) DAYS. 6 mL 3    fluticasone (FLONASE) 50 MCG/ACT nasal spray Place 1 spray into both nostrils daily. (Patient taking differently: Place 2 sprays into both nostrils daily. As needed.) 48 g 1   glucose blood (ONETOUCH ULTRA) test strip Test blood sugar 4 times daily as directed. 400 strip 3   latanoprost (XALATAN) 0.005 % ophthalmic solution Place 1 drop into both eyes daily 2.5 mL 6   losartan (COZAAR) 100 MG tablet Take 1 tablet (100 mg total) by mouth daily. 90 tablet 3   metoprolol succinate (TOPROL-XL) 100 MG 24 hr tablet Take 1 tablet (100 mg total) by mouth daily. Take with or immediately following a meal. 90 tablet 3   rosuvastatin (CRESTOR) 10 MG tablet Take 0.5 tablets (5 mg total) by mouth daily. 45 tablet 3   Semaglutide, 2 MG/DOSE, 8 MG/3ML SOPN Inject 2 mg as directed once a week. 9 mL 2   valACYclovir (VALTREX) 1000 MG tablet Take 1 tablet (1,000 mg total) by mouth 2 (two) times daily. 60 tablet 2   No current facility-administered medications for this visit.    Review of Systems: GENERAL: negative for malaise, night sweats HEENT: No changes in hearing or vision, no nose bleeds or other nasal problems. NECK: Negative for lumps, goiter, pain and significant neck swelling RESPIRATORY: Negative for cough, wheezing CARDIOVASCULAR: Negative for chest pain, leg swelling, palpitations, orthopnea GI: SEE HPI MUSCULOSKELETAL: Negative for joint pain or swelling, back pain, and muscle pain. SKIN: Negative for lesions, rash PSYCH: Negative for sleep disturbance, mood disorder and recent psychosocial stressors. HEMATOLOGY Negative for prolonged bleeding, bruising easily, and swollen nodes. ENDOCRINE: Negative for cold or heat intolerance, polyuria, polydipsia and goiter. NEURO: negative for tremor, gait imbalance, syncope and seizures. The remainder of the review of systems is noncontributory.   Physical Exam: BP 121/72 (BP Location: Left Arm, Patient Position: Sitting, Cuff Size: Large)   Pulse 75    Temp (!) 96.9 F (36.1 C) (Temporal)   Ht 5\' 2"  (1.575 m)   Wt 203 lb 6.4 oz (92.3 kg)   BMI 37.20 kg/m  GENERAL: The patient is AO x3, in no acute distress.  Obese HEENT: Head is normocephalic and atraumatic. EOMI are intact. Mouth is well hydrated and without lesions. NECK: Supple. No masses LUNGS: Clear to auscultation. No presence of rhonchi/wheezing/rales. Adequate chest expansion HEART: RRR, normal s1 and s2. ABDOMEN: Soft, nontender, no guarding, no peritoneal signs, and nondistended. BS +. No masses. EXTREMITIES: Without any cyanosis, clubbing, rash, lesions or edema. NEUROLOGIC: AOx3, no focal motor deficit. SKIN: no jaundice, no rashes   Imaging/Labs: as above  I personally reviewed and interpreted the available labs, imaging and endoscopic files.  Impression and Plan: Danielle Ellis is a 68 y.o. female with PMH anxiety, CAD s/p CABG, OSA, HTN, DM, who presents for evaluation of elevated LFTs.  Patient has presented chronically elevated aminotransferases and alkaline phosphatase.  She has been asymptomatic and denies any complaints.  Elevation showing a mixed pattern.  Has had some previous abdominal imaging that has not shown any hepatic parenchymal abnormalities, no biliary obstruction although there is presence of cystic lesions in  her pancreas (possible sidebranch IPMN).  We will check serologies to evaluate viral, autoimmune and metabolic etiologies of her elevated liver enzymes.  There could be a component of NASH driving her abnormal LFTs, for which she was advised to implement a Mediterranean diet.  Ultimately, if her workup is negative and she presents persistently elevated LFTs, we will need to proceed with a liver biopsy.  -- Check CBC, CMP, hepatitis A/B/C serologies, iron panel, ANA, AMA, ASMA, IgG, celiac disease panel -Repeat MRCP in 03/2024 -Explained to the patient the etiology and consequences of his current liver disease. Patient was counseled about the  benefit of implementing a Mediterranean diet and exercise plan to decrease at least 5% of weight. The patient understood about the importance of lifestyle changes to potentially reverse his liver involvement.  All questions were answered.      Danielle Blazing, MD Gastroenterology and Hepatology [facility]

## 2023-10-23 LAB — IRON,TIBC AND FERRITIN PANEL
Ferritin: 174 ng/mL — ABNORMAL HIGH (ref 15–150)
Iron Saturation: 23 % (ref 15–55)
Iron: 84 ug/dL (ref 27–139)
Total Iron Binding Capacity: 360 ug/dL (ref 250–450)
UIBC: 276 ug/dL (ref 118–369)

## 2023-10-23 LAB — CELIAC DISEASE PANEL
Endomysial IgA: NEGATIVE
IgA/Immunoglobulin A, Serum: 202 mg/dL (ref 87–352)
Transglutaminase IgA: <2 U/mL (ref 0–3)

## 2023-10-23 LAB — CBC WITH DIFFERENTIAL/PLATELET
Basophils Absolute: 0 10*3/uL (ref 0.0–0.2)
Basos: 0 %
EOS (ABSOLUTE): 0.1 10*3/uL (ref 0.0–0.4)
Eos: 2 %
Hematocrit: 44.8 % (ref 34.0–46.6)
Hemoglobin: 14.4 g/dL (ref 11.1–15.9)
Immature Grans (Abs): 0 10*3/uL (ref 0.0–0.1)
Immature Granulocytes: 0 %
Lymphocytes Absolute: 2 10*3/uL (ref 0.7–3.1)
Lymphs: 37 %
MCH: 29.1 pg (ref 26.6–33.0)
MCHC: 32.1 g/dL (ref 31.5–35.7)
MCV: 91 fL (ref 79–97)
Monocytes Absolute: 0.4 10*3/uL (ref 0.1–0.9)
Monocytes: 8 %
Neutrophils Absolute: 3 10*3/uL (ref 1.4–7.0)
Neutrophils: 53 %
Platelets: 182 10*3/uL (ref 150–450)
RBC: 4.95 x10E6/uL (ref 3.77–5.28)
RDW: 13 % (ref 11.7–15.4)
WBC: 5.5 10*3/uL (ref 3.4–10.8)

## 2023-10-23 LAB — IGM: IgM (Immunoglobulin M), Srm: 45 mg/dL (ref 26–217)

## 2023-10-23 LAB — COMPREHENSIVE METABOLIC PANEL
ALT: 107 IU/L — ABNORMAL HIGH (ref 0–32)
AST: 74 IU/L — ABNORMAL HIGH (ref 0–40)
Albumin: 4.7 g/dL (ref 3.9–4.9)
Alkaline Phosphatase: 170 IU/L — ABNORMAL HIGH (ref 44–121)
BUN/Creatinine Ratio: 18 (ref 12–28)
BUN: 17 mg/dL (ref 8–27)
Bilirubin Total: 0.5 mg/dL (ref 0.0–1.2)
CO2: 23 mmol/L (ref 20–29)
Calcium: 9.7 mg/dL (ref 8.7–10.3)
Chloride: 104 mmol/L (ref 96–106)
Creatinine, Ser: 0.96 mg/dL (ref 0.57–1.00)
Globulin, Total: 2.5 g/dL (ref 1.5–4.5)
Glucose: 84 mg/dL (ref 70–99)
Potassium: 4.5 mmol/L (ref 3.5–5.2)
Sodium: 143 mmol/L (ref 134–144)
Total Protein: 7.2 g/dL (ref 6.0–8.5)
eGFR: 64 mL/min/{1.73_m2} (ref >59)

## 2023-10-23 LAB — HEPATITIS B SURFACE ANTIBODY,QUALITATIVE: Hep B Surface Ab, Qual: NONREACTIVE

## 2023-10-23 LAB — ANA: Anti Nuclear Antibody (ANA): NEGATIVE

## 2023-10-23 LAB — IGG: IgG (Immunoglobin G), Serum: 968 mg/dL (ref 586–1602)

## 2023-10-23 LAB — ACUTE VIRAL HEPATITIS (HAV, HBV, HCV)
HCV Ab: NONREACTIVE
Hep A IgM: NEGATIVE
Hep B C IgM: NEGATIVE
Hepatitis B Surface Ag: NEGATIVE

## 2023-10-23 LAB — HEPATITIS A ANTIBODY, TOTAL: hep A Total Ab: NEGATIVE

## 2023-10-23 LAB — ANTI-SMOOTH MUSCLE ANTIBODY, IGG: Smooth Muscle Ab: 6 U (ref 0–19)

## 2023-10-23 LAB — MITOCHONDRIAL ANTIBODIES: Mitochondrial Ab: <20 U (ref 0.0–20.0)

## 2023-10-23 LAB — HCV INTERPRETATION

## 2023-10-24 ENCOUNTER — Telehealth: Payer: Self-pay

## 2023-10-24 ENCOUNTER — Other Ambulatory Visit (HOSPITAL_COMMUNITY): Payer: Self-pay

## 2023-10-24 ENCOUNTER — Other Ambulatory Visit: Payer: Self-pay

## 2023-10-24 NOTE — Telephone Encounter (Signed)
 Pharmacy Patient Advocate Encounter  Received notification from [name] [employer] that Prior Authorization for Ozempic (2 MG/DOSE) 8MG /3ML pen-injectors has been APPROVED from 10/24/23 to 10/23/24. Ran test claim, Copay is $0. This test claim was processed through [facility]- copay amounts may vary at other pharmacies due to pharmacy/plan contracts, or as the patient moves through the different stages of their insurance plan.   PA #/Case ID/Reference #:  [id number]

## 2023-10-26 ENCOUNTER — Other Ambulatory Visit (HOSPITAL_COMMUNITY): Payer: Self-pay

## 2023-10-28 DIAGNOSIS — G4733 Obstructive sleep apnea (adult) (pediatric): Secondary | ICD-10-CM | POA: Diagnosis not present

## 2023-11-23 ENCOUNTER — Other Ambulatory Visit: Payer: Self-pay | Admitting: Family

## 2023-11-23 ENCOUNTER — Other Ambulatory Visit: Payer: Self-pay

## 2023-11-23 ENCOUNTER — Telehealth: Payer: Self-pay | Admitting: Cardiology

## 2023-11-23 ENCOUNTER — Other Ambulatory Visit (HOSPITAL_COMMUNITY): Payer: Self-pay

## 2023-11-23 ENCOUNTER — Other Ambulatory Visit: Payer: Self-pay | Admitting: Cardiology

## 2023-11-23 DIAGNOSIS — F411 Generalized anxiety disorder: Secondary | ICD-10-CM

## 2023-11-23 DIAGNOSIS — F320 Major depressive disorder, single episode, mild: Secondary | ICD-10-CM

## 2023-11-23 MED ORDER — LOSARTAN POTASSIUM 100 MG PO TABS
100.0000 mg | ORAL_TABLET | Freq: Every day | ORAL | 3 refills | Status: DC
  Filled 2023-11-23: qty 90, 90d supply, fill #0
  Filled 2024-03-01: qty 90, 90d supply, fill #1
  Filled 2024-05-28: qty 90, 90d supply, fill #2

## 2023-11-23 MED ORDER — ROSUVASTATIN CALCIUM 10 MG PO TABS
5.0000 mg | ORAL_TABLET | Freq: Every day | ORAL | 3 refills | Status: DC
  Filled 2023-11-23: qty 45, 90d supply, fill #0
  Filled 2024-03-01: qty 45, 90d supply, fill #1
  Filled 2024-05-28: qty 45, 90d supply, fill #2

## 2023-11-23 MED ORDER — METOPROLOL SUCCINATE ER 100 MG PO TB24
100.0000 mg | ORAL_TABLET | Freq: Every day | ORAL | 3 refills | Status: DC
  Filled 2023-11-23: qty 90, 90d supply, fill #0
  Filled 2024-02-27: qty 90, 90d supply, fill #1
  Filled 2024-05-28: qty 90, 90d supply, fill #2

## 2023-11-23 NOTE — Telephone Encounter (Signed)
*  STAT* If patient is at the pharmacy, call can be transferred to refill team.   1. Which medications need to be refilled? (please list name of each medication and dose if known) metoprolol succinate (TOPROL-XL) 100 MG 24 hr tablet   losartan (COZAAR) 100 MG tablet  rosuvastatin (CRESTOR) 10 MG tablet    2. Which pharmacy/location (including street and city if local pharmacy) is medication to be sent to?  [facility]      3. Do they need a 30 day or 90 day supply? 90 day

## 2023-11-24 ENCOUNTER — Other Ambulatory Visit: Payer: Self-pay

## 2023-11-24 ENCOUNTER — Other Ambulatory Visit (HOSPITAL_COMMUNITY): Payer: Self-pay

## 2023-11-24 MED ORDER — ESCITALOPRAM OXALATE 10 MG PO TABS
10.0000 mg | ORAL_TABLET | Freq: Every day | ORAL | 0 refills | Status: DC
Start: 2023-11-24 — End: 2024-02-16
  Filled 2023-11-24: qty 90, 90d supply, fill #0

## 2023-12-01 ENCOUNTER — Other Ambulatory Visit (HOSPITAL_COMMUNITY): Payer: Self-pay

## 2023-12-05 ENCOUNTER — Other Ambulatory Visit (HOSPITAL_COMMUNITY): Payer: Self-pay

## 2023-12-15 ENCOUNTER — Ambulatory Visit: Payer: PPO

## 2023-12-15 VITALS — BP 121/72 | HR 75 | Ht 62.0 in | Wt 203.0 lb

## 2023-12-15 DIAGNOSIS — Z0000 Encounter for general adult medical examination without abnormal findings: Secondary | ICD-10-CM

## 2023-12-15 NOTE — Patient Instructions (Addendum)
 Ms. Wittman , Thank you for taking time to come for your Medicare Wellness Visit. I appreciate your ongoing commitment to your health goals. Please review the following plan we discussed and let me know if I can assist you in the future.   Referrals/Orders/Follow-Ups/Clinician Recommendations: Please remember to discuss with your provider about getting your vaccines updated: Shingles, Tetanus and don't forget to make your eye appointment.   This is a list of the screening recommended for you and due dates:  Health Maintenance  Topic Date Due   Eye exam for diabetics  04/07/2023   Zoster (Shingles) Vaccine (1 of 2) 12/18/2023*   DTaP/Tdap/Td vaccine (1 - Tdap) 06/16/2024*   DEXA scan (bone density measurement)  09/19/2024*   COVID-19 Vaccine (4 - 2024-25 season) 12/30/2024*   Yearly kidney health urinalysis for diabetes  03/16/2024   Flu Shot  03/16/2024   Complete foot exam   03/16/2024   Hemoglobin A1C  03/19/2024   Mammogram  03/31/2024   Yearly kidney function blood test for diabetes  10/19/2024   Medicare Annual Wellness Visit  12/14/2024   Colon Cancer Screening  07/29/2025   Pneumonia Vaccine  Completed   Hepatitis C Screening  Completed   HPV Vaccine  Aged Out   Meningitis B Vaccine  Aged Out  *Topic was postponed. The date shown is not the original due date.    Advanced directives: (Declined) Advance directive discussed with you today. Even though you declined this today, please call our office should you change your mind, and we can give you the proper paperwork for you to fill out.  Next Medicare Annual Wellness Visit scheduled for next year: Yes

## 2023-12-15 NOTE — Progress Notes (Signed)
 Subjective:   Danielle Ellis is a 68 y.o. who presents for a Medicare Wellness preventive visit.  Visit Complete: Virtual I connected with  Danielle Ellis on 12/15/23 by a audio enabled telemedicine application and verified that I am speaking with the correct person using two identifiers.  Patient Location: Home  Provider Location: Home Office  I discussed the limitations of evaluation and management by telemedicine. The patient expressed understanding and agreed to proceed.  Vital Signs: Because this visit was a virtual/telehealth visit, some criteria may be missing or patient reported. Any vitals not documented were not able to be obtained and vitals that have been documented are patient reported.  VideoDeclined- This patient declined Librarian, academic. Therefore the visit was completed with audio only.  Persons Participating in Visit: Patient.  AWV Questionnaire: Yes: Patient Medicare AWV questionnaire was completed by the patient on 12/12/23; I have confirmed that all information answered by patient is correct and no changes since this date.  Cardiac Risk Factors include: advanced age (>34men, >76 women);obesity (BMI >30kg/m2);dyslipidemia;hypertension     Objective:    Today's Vitals   12/15/23 1137  BP: 121/72  Pulse: 75  Weight: 203 lb (92.1 kg)  Height: 5\' 2"  (1.575 m)   Body mass index is 37.13 kg/m.     12/15/2023   11:32 AM 12/13/2022    8:21 AM 12/09/2021   10:36 AM 12/08/2020   10:59 AM 10/10/2020   12:48 AM 10/09/2020    6:38 PM 12/07/2019   10:39 AM  Advanced Directives  Does Patient Have a Medical Advance Directive? No No No No No No No  Would patient like information on creating a medical advance directive?  No - Patient declined No - Patient declined Yes (MAU/Ambulatory/Procedural Areas - Information given) No - Patient declined  Yes (Inpatient - patient defers creating a medical advance directive at this time - Information  given)    Current Medications (verified) Outpatient Encounter Medications as of 12/15/2023  Medication Sig   amLODipine  (NORVASC ) 5 MG tablet Take 1 tablet (5 mg total) by mouth daily.   baclofen  (LIORESAL ) 10 MG tablet Take 1 tablet (10 mg total) by mouth 3 (three) times daily.   blood glucose meter kit and supplies Dispense based on patient and insurance preference. Use up to four times daily as directed. (FOR ICD-10 E10.9, E11.9).   escitalopram  (LEXAPRO ) 10 MG tablet Take 1 tablet (10 mg total) by mouth daily.   Evolocumab  (REPATHA  SURECLICK) 140 MG/ML SOAJ INJECT 140 MG INTO THE SKIN EVERY 14 (FOURTEEN) DAYS.   fluticasone  (FLONASE ) 50 MCG/ACT nasal spray Place 1 spray into both nostrils daily. (Patient taking differently: Place 2 sprays into both nostrils daily. As needed.)   glucose blood (ONETOUCH ULTRA) test strip Test blood sugar 4 times daily as directed.   latanoprost  (XALATAN ) 0.005 % ophthalmic solution Place 1 drop into both eyes daily   losartan  (COZAAR ) 100 MG tablet Take 1 tablet (100 mg total) by mouth daily.   metoprolol  succinate (TOPROL -XL) 100 MG 24 hr tablet Take 1 tablet (100 mg total) by mouth daily. Take with or immediately following a meal.   rosuvastatin  (CRESTOR ) 10 MG tablet Take 0.5 tablets (5 mg total) by mouth daily.   Semaglutide , 2 MG/DOSE, 8 MG/3ML SOPN Inject 2 mg as directed once a week.   [DISCONTINUED] diclofenac  (VOLTAREN ) 75 MG EC tablet TAKE 1 TABLET BY MOUTH TWICE A DAY   [DISCONTINUED] valACYclovir  (VALTREX ) 1000 MG tablet Take  1 tablet (1,000 mg total) by mouth 2 (two) times daily.   No facility-administered encounter medications on file as of 12/15/2023.    Allergies (verified) Patient has no known allergies.   History: Past Medical History:  Diagnosis Date   Anxiety    Hypertension    Sleep apnea    Past Surgical History:  Procedure Laterality Date   APPENDECTOMY     CORONARY ARTERY BYPASS GRAFT N/A 10/14/2020   Procedure: CORONARY  ARTERY BYPASS GRAFTING (CABG) X FIVE, USING LEFT INTERNAL MAMMARY ARTERY AND BILATERAL LEGS GREATER SAPHENOUS VEINS HARVESTED ENDOSCOPICALLY;  Surgeon: Bartley Lightning, MD;  Location: [facility] OR;  Service: Open Heart Surgery;  Laterality: N/A;   L ankle surgery     LEFT HEART CATH AND CORONARY ANGIOGRAPHY N/A 10/10/2020   Procedure: LEFT HEART CATH AND CORONARY ANGIOGRAPHY;  Surgeon: Swaziland, Peter M, MD;  Location: [facility] INVASIVE CV LAB;  Service: Cardiovascular;  Laterality: N/A;   TEE WITHOUT CARDIOVERSION N/A 10/14/2020   Procedure: TRANSESOPHAGEAL ECHOCARDIOGRAM (TEE);  Surgeon: Bartley Lightning, MD;  Location: [facility] OR;  Service: Open Heart Surgery;  Laterality: N/A;   Family History  Problem Relation Age of Onset   Diabetes Mother    Kidney disease Mother    Cancer Mother    Heart attack Mother    Social History   Socioeconomic History   Marital status: Married    Spouse name: Not on file   Number of children: 1   Years of education: Not on file   Highest education level: Some college, no degree  Occupational History   Occupation: part time Therapist, sports    Comment: retired  Tobacco Use   Smoking status: Never   Smokeless tobacco: Never  Vaping Use   Vaping status: Never Used  Substance and Sexual Activity   Alcohol use: No   Drug use: No   Sexual activity: Yes    Partners: Male  Other Topics Concern   Not on file  Social History Narrative   Lives with her husband. They have one daughter, Moshe Ares - She lives 10 minutes away.   Social Drivers of Corporate investment banker Strain: Low Risk  (12/15/2023)   Overall Financial Resource Strain (CARDIA)    Difficulty of Paying Living Expenses: Not hard at all  Recent Concern: Financial Resource Strain - Medium Risk (09/18/2023)   Overall Financial Resource Strain (CARDIA)    Difficulty of Paying Living Expenses: Somewhat hard  Food Insecurity: No Food Insecurity (12/15/2023)   Hunger Vital Sign    Worried About Running Out of Food in  the Last Year: Never true    Ran Out of Food in the Last Year: Never true  Transportation Needs: No Transportation Needs (12/15/2023)   PRAPARE - Administrator, Civil Service (Medical): No    Lack of Transportation (Non-Medical): No  Physical Activity: Patient Declined (12/15/2023)   Exercise Vital Sign    Days of Exercise per Week: Patient declined    Minutes of Exercise per Session: Patient declined  Recent Concern: Physical Activity - Insufficiently Active (09/18/2023)   Exercise Vital Sign    Days of Exercise per Week: 5 days    Minutes of Exercise per Session: 20 min  Stress: No Stress Concern Present (12/15/2023)   [employer] of Occupational Health - Occupational Stress Questionnaire    Feeling of Stress : Not at all  Social Connections: Moderately Isolated (12/15/2023)   Social Connection and Isolation Panel [NHANES]  Frequency of Communication with Friends and Family: More than three times a week    Frequency of Social Gatherings with Friends and Family: More than three times a week    Attends Religious Services: Never    Database administrator or Organizations: No    Attends Engineer, structural: Never    Marital Status: Married    Tobacco Counseling Counseling given: Yes    Clinical Intake:  Pre-visit preparation completed: Yes  Pain : No/denies pain     BMI - recorded: 37.13 Nutritional Status: BMI > 30  Obese Nutritional Risks: None Diabetes: Yes CBG done?: Yes (89)  Lab Results  Component Value Date   HGBA1C 5.5 09/20/2023   HGBA1C 5.5 06/17/2023   HGBA1C 5.5 03/17/2023     How often do you need to have someone help you when you read instructions, pamphlets, or other written materials from your doctor or pharmacy?: 1 - Never  Interpreter Needed?: No  Information entered by :: Alia T/cma   Activities of Daily Living     12/15/2023   11:30 AM 12/12/2023   12:49 PM  In your present state of health, do you have any difficulty  performing the following activities:  Hearing? 0 0  Vision? 0 0  Difficulty concentrating or making decisions? 0   Walking or climbing stairs? 0   Dressing or bathing? 0 0  Doing errands, shopping? 0 0  Preparing Food and eating ? N N  Using the Toilet? N N  In the past six months, have you accidently leaked urine? Y Y  Do you have problems with loss of bowel control? N N  Managing your Medications? N N  Managing your Finances? N N  Housekeeping or managing your Housekeeping? N N    Patient Care Team: Yevette Hem, FNP as PCP - General (Family Medicine) [place], Peter M, MD as PCP - Cardiology (Cardiology) Millicent Ally, MD as PCP - Sleep Medicine (Cardiology) Delilah Fend, [facility] as Pharmacist (Family Medicine)  Indicate any recent Medical Services you may have received from other than [facility] providers in the past year (date may be approximate).     Assessment:   This is a routine wellness examination for Danielle Ellis.  Hearing/Vision screen Hearing Screening - Comments:: Pt denies hearing dif Vision Screening - Comments:: pt wear glasses-pt goes to [facility] [address] in [place], [place]   Goals Addressed               This Visit's Progress     Exercise 3x per week (30 min per time)   On track     Continue walking for 25 minutes twice per day. Work on losing 2-4 lbs per month until you get to your goal weight. Goals Addressed             This Visit's Progress    Exercise 3x per week (30 min per time)       Continue walking for 25 minutes twice per day. Work on losing 2-4 lbs per month until you get to your goal weight.             T2DM PHARMD GOAL (pt-stated)   On track     Current Barriers:  Unable to independently afford treatment regimen Unable to maintain control of T2DM  Pharmacist Clinical Goal(s):  patient will verbalize ability to afford treatment regimen maintain control of T2DM as evidenced by GOAL A1C  through collaboration with PharmD and provider.  Interventions: 1:1 collaboration with Yevette Hem, FNP regarding development and update of comprehensive plan of care as evidenced by provider attestation and co-signature Inter-disciplinary care team collaboration (see longitudinal plan of care) Comprehensive medication review performed; medication list updated in electronic medical record  Diabetes: Goal on Track (progressing): YES. controlled; current treatment:OZEMPIC  2MG ;  Patient is currently controlled, however she is now having difficulty paying for ozempic  in the coverage gap Metformin  d/c'd Denies personal and family history of Medullary thyroid  cancer (MTC) Patient reports 30lb weight loss A1c 5.9%, GFR 73 Current glucose readings: fasting glucose: <130, post prandial glucose: n/a Denies hypoglycemic/hyperglycemic symptoms Current exercise: encouraged Assessed patient finances. Enrolled in novo nordisk PAP; healthwell grant for repatha  via heart care  Hypertension -improving--cards added new amlodipine ; patient reports BP averages around 130/80s  Patient Goals/Self-Care Activities patient will:  - take medications as prescribed as evidenced by patient report and record review check glucose DAILY-FASTING OR IF SYMPTOMATIC, document, and provide at future appointments collaborate with provider on medication access solutions target a minimum of 150 minutes of moderate intensity exercise weekly engage in dietary modifications by FOLLOWING A HEART HEALTHY DIET/HEALTHY PLATE METHOD         Depression Screen     12/15/2023   11:34 AM 09/20/2023    8:35 AM 06/17/2023    8:35 AM 03/17/2023   10:25 AM 12/13/2022    8:21 AM 07/16/2022    9:35 AM 06/14/2022    2:37 PM  PHQ 2/9 Scores  PHQ - 2 Score 0 0 0 1 0 0 0  PHQ- 9 Score  0 0 1  0 0    Fall Risk     12/12/2023   12:49 PM 06/17/2023    8:35 AM 12/13/2022    8:19 AM 12/10/2022    9:45 AM 10/15/2022    9:09 AM  Fall Risk   Falls in the past year? 0 0 0 0 0  Number  falls in past yr:  0 0    Injury with Fall?  0 0    Risk for fall due to :  No Fall Risks No Fall Risks    Follow up   Falls prevention discussed      MEDICARE RISK AT HOME:  Medicare Risk at Home Any stairs in or around the home?: (Patient-Rptd) Yes If so, are there any without handrails?: (Patient-Rptd) Yes Home free of loose throw rugs in walkways, pet beds, electrical cords, etc?: (Patient-Rptd) Yes Adequate lighting in your home to reduce risk of falls?: (Patient-Rptd) Yes Life alert?: (Patient-Rptd) No Use of a cane, walker or w/c?: (Patient-Rptd) No Grab bars in the bathroom?: (Patient-Rptd) No Shower chair or bench in shower?: (Patient-Rptd) No Elevated toilet seat or a handicapped toilet?: (Patient-Rptd) No  TIMED UP AND GO:  Was the test performed?  no  Cognitive Function: 6CIT completed        12/15/2023   11:36 AM 12/13/2022    8:22 AM 12/09/2021   10:39 AM 12/07/2019   10:43 AM  6CIT Screen  What Year? 0 points 0 points 0 points 0 points  What month? 0 points 0 points 0 points 0 points  What time? 0 points 0 points 0 points 0 points  Count back from 20 0 points 0 points 0 points 0 points  Months in reverse 0 points 0 points 0 points 0 points  Repeat phrase 0 points 0 points 6 points 0 points  Total Score 0 points 0 points 6 points  0 points    Immunizations Immunization History  Administered Date(s) Administered   Fluad Quad(high Dose 65+) 05/23/2019, 10/03/2020, 06/14/2022   Fluad Trivalent(High Dose 65+) 06/17/2023   Influenza, High Dose Seasonal PF 07/08/2021   Moderna Sars-Covid-2 Vaccination 10/11/2019, 11/09/2019, 08/05/2020   Pneumococcal Conjugate-13 05/23/2019   Pneumococcal Polysaccharide-23 02/11/2022   Pneumococcal-Unspecified 06/14/2018    Screening Tests Health Maintenance  Topic Date Due   OPHTHALMOLOGY EXAM  04/07/2023   Zoster Vaccines- Shingrix (1 of 2) 12/18/2023 (Originally 01/03/2004)   DTaP/Tdap/Td (1 - Tdap) 06/16/2024 (Originally  [birth year]02/1973)   DEXA SCAN  09/19/2024 (Originally 07/01/2023)   COVID-19 Vaccine (4 - 2024-25 season) 12/30/2024 (Originally 04/17/2023)   Diabetic kidney evaluation - Urine ACR  03/16/2024   INFLUENZA VACCINE  03/16/2024   FOOT EXAM  03/16/2024   HEMOGLOBIN A1C  03/19/2024   MAMMOGRAM  03/31/2024   Diabetic kidney evaluation - eGFR measurement  10/19/2024   Medicare Annual Wellness (AWV)  12/14/2024   Colonoscopy  07/29/2025   Pneumonia Vaccine 35+ Years old  Completed   Hepatitis C Screening  Completed   HPV VACCINES  Aged Out   Meningococcal B Vaccine  Aged Out    Health Maintenance  Health Maintenance Due  Topic Date Due   OPHTHALMOLOGY EXAM  04/07/2023   Health Maintenance Items Addressed: See Nurse Notes  Additional Screening:  Vision Screening: Recommended annual ophthalmology exams for early detection of glaucoma and other disorders of the eye.  Dental Screening: Recommended annual dental exams for proper oral hygiene  Community Resource Referral / Chronic Care Management: CRR required this visit?  No   CCM required this visit?  No     Plan:     I have personally reviewed and noted the following in the patient's chart:   Medical and social history Use of alcohol, tobacco or illicit drugs  Current medications and supplements including opioid prescriptions. Patient is not currently taking opioid prescriptions. Functional ability and status Nutritional status Physical activity Advanced directives List of other physicians Hospitalizations, surgeries, and ER visits in previous 12 months Vitals Screenings to include cognitive, depression, and falls Referrals and appointments  In addition, I have reviewed and discussed with patient certain preventive protocols, quality metrics, and best practice recommendations. A written personalized care plan for preventive services as well as general preventive health recommendations were provided to patient.     Michaelle Adolphus, CMA   12/15/2023   After Visit Summary: (MyChart) Due to this being a telephonic visit, the after visit summary with patients personalized plan was offered to patient via MyChart   Notes: Clinician Recommendations: Please remember to discuss with your provider about getting your vaccines updated: Shingles, Tetanus and don't forget to make your eye appointment.

## 2024-01-17 ENCOUNTER — Ambulatory Visit: Admitting: Family

## 2024-01-17 ENCOUNTER — Ambulatory Visit (INDEPENDENT_AMBULATORY_CARE_PROVIDER_SITE_OTHER)

## 2024-01-17 ENCOUNTER — Telehealth: Payer: Self-pay

## 2024-01-17 ENCOUNTER — Other Ambulatory Visit (HOSPITAL_COMMUNITY): Payer: Self-pay

## 2024-01-17 ENCOUNTER — Other Ambulatory Visit: Payer: Self-pay

## 2024-01-17 ENCOUNTER — Encounter: Payer: Self-pay | Admitting: Pharmacist

## 2024-01-17 ENCOUNTER — Encounter: Payer: Self-pay | Admitting: Family

## 2024-01-17 VITALS — BP 121/66 | HR 66 | Temp 97.3°F | Ht 62.0 in | Wt 204.4 lb

## 2024-01-17 DIAGNOSIS — Z951 Presence of aortocoronary bypass graft: Secondary | ICD-10-CM

## 2024-01-17 DIAGNOSIS — I10 Essential (primary) hypertension: Secondary | ICD-10-CM

## 2024-01-17 DIAGNOSIS — I2510 Atherosclerotic heart disease of native coronary artery without angina pectoris: Secondary | ICD-10-CM | POA: Diagnosis not present

## 2024-01-17 DIAGNOSIS — Z780 Asymptomatic menopausal state: Secondary | ICD-10-CM

## 2024-01-17 DIAGNOSIS — I252 Old myocardial infarction: Secondary | ICD-10-CM | POA: Diagnosis not present

## 2024-01-17 DIAGNOSIS — F320 Major depressive disorder, single episode, mild: Secondary | ICD-10-CM | POA: Diagnosis not present

## 2024-01-17 DIAGNOSIS — F411 Generalized anxiety disorder: Secondary | ICD-10-CM

## 2024-01-17 DIAGNOSIS — E1169 Type 2 diabetes mellitus with other specified complication: Secondary | ICD-10-CM | POA: Diagnosis not present

## 2024-01-17 DIAGNOSIS — E785 Hyperlipidemia, unspecified: Secondary | ICD-10-CM

## 2024-01-17 DIAGNOSIS — Z7985 Long-term (current) use of injectable non-insulin antidiabetic drugs: Secondary | ICD-10-CM

## 2024-01-17 DIAGNOSIS — Z23 Encounter for immunization: Secondary | ICD-10-CM

## 2024-01-17 LAB — CMP14+EGFR
ALT: 89 IU/L — ABNORMAL HIGH (ref 0–32)
AST: 41 IU/L — ABNORMAL HIGH (ref 0–40)
Albumin: 4.5 g/dL (ref 3.9–4.9)
Alkaline Phosphatase: 166 IU/L — ABNORMAL HIGH (ref 44–121)
BUN/Creatinine Ratio: 14 (ref 12–28)
BUN: 14 mg/dL (ref 8–27)
Bilirubin Total: 0.5 mg/dL (ref 0.0–1.2)
CO2: 22 mmol/L (ref 20–29)
Calcium: 9.4 mg/dL (ref 8.7–10.3)
Chloride: 103 mmol/L (ref 96–106)
Creatinine, Ser: 0.99 mg/dL (ref 0.57–1.00)
Globulin, Total: 2.4 g/dL (ref 1.5–4.5)
Glucose: 91 mg/dL (ref 70–99)
Potassium: 4.5 mmol/L (ref 3.5–5.2)
Sodium: 142 mmol/L (ref 134–144)
Total Protein: 6.9 g/dL (ref 6.0–8.5)
eGFR: 61 mL/min/{1.73_m2} (ref >59)

## 2024-01-17 LAB — HM DIABETES EYE EXAM

## 2024-01-17 MED ORDER — TIRZEPATIDE 5 MG/0.5ML ~~LOC~~ SOAJ
5.0000 mg | SUBCUTANEOUS | 2 refills | Status: DC
Start: 2024-01-17 — End: 2024-02-16
  Filled 2024-01-17: qty 2, 28d supply, fill #0
  Filled 2024-01-23: qty 6, 84d supply, fill #0

## 2024-01-17 MED ORDER — TIRZEPATIDE 5 MG/0.5ML ~~LOC~~ SOAJ
5.0000 mg | SUBCUTANEOUS | 2 refills | Status: DC
Start: 2024-01-17 — End: 2024-01-17
  Filled 2024-01-17: qty 6, 84d supply, fill #0

## 2024-01-17 NOTE — Addendum Note (Signed)
 Addended by: Kyra Phy on: 01/17/2024 09:19 AM   Modules accepted: Orders

## 2024-01-17 NOTE — Progress Notes (Signed)
 Subjective:    Patient ID: Danielle Ellis, female    DOB: [birth year], 68 y.o.   MRN: [id number]  Chief Complaint  Patient presents with   Medical Management of Chronic Issues    Pt present to the office today chronic follow up. She is followed by Cardiologists every 6 months  for NSTEMI on 10/09/20.    She was started Ozempic  2 mg. She had lost 12 lbs. She is morbid obese with a BMI of 37 with HTN and DM.      01/17/2024    8:01 AM 12/15/2023   11:37 AM 10/20/2023    8:23 AM  Last 3 Weights  Weight (lbs) 204 lb 6.4 oz 203 lb 203 lb 6.4 oz  Weight (kg) 92.715 kg 92.08 kg 92.262 kg     Hypertension This is a chronic problem. The current episode started more than 1 year ago. The problem has been resolved since onset. The problem is controlled. Associated symptoms include anxiety. Pertinent negatives include no blurred vision, malaise/fatigue, peripheral edema or shortness of breath. Risk factors for coronary artery disease include dyslipidemia, diabetes mellitus, obesity and sedentary lifestyle. The current treatment provides moderate improvement.  Diabetes She presents for her follow-up diabetic visit. She has type 2 diabetes mellitus. Hypoglycemia symptoms include nervousness/anxiousness. Pertinent negatives for diabetes include no blurred vision, no fatigue and no foot paresthesias. Symptoms are stable. Risk factors for coronary artery disease include dyslipidemia, diabetes mellitus, hypertension and sedentary lifestyle. She is following a generally healthy diet. Her overall blood glucose range is 90-110 mg/dl. Eye exam is current.  Hyperlipidemia This is a chronic problem. The current episode started more than 1 year ago. The problem is controlled. Recent lipid tests were reviewed and are normal. Exacerbating diseases include obesity. Pertinent negatives include no shortness of breath. Current antihyperlipidemic treatment includes statins. The current treatment provides moderate  improvement of lipids. Risk factors for coronary artery disease include diabetes mellitus, hypertension, a sedentary lifestyle, post-menopausal, dyslipidemia and obesity.  Depression        This is a chronic problem.  The current episode started more than 1 year ago.   The problem occurs intermittently.  Associated symptoms include sad.  Associated symptoms include no fatigue, no helplessness and no hopelessness.  Past treatments include SSRIs - Selective serotonin reuptake inhibitors.  Past medical history includes anxiety.   Anxiety Presents for follow-up visit. Symptoms include excessive worry and nervous/anxious behavior. Patient reports no hyperventilation or shortness of breath. Symptoms occur occasionally. The severity of symptoms is mild.        Review of Systems  Constitutional:  Negative for fatigue and malaise/fatigue.  Eyes:  Negative for blurred vision.  Respiratory:  Negative for shortness of breath.   Psychiatric/Behavioral:  The patient is nervous/anxious.   All other systems reviewed and are negative.      Objective:   Physical Exam Vitals reviewed.  Constitutional:      General: She is not in acute distress.    Appearance: She is well-developed. She is obese.  HENT:     Head: Normocephalic and atraumatic.     Right Ear: Tympanic membrane normal.     Left Ear: Tympanic membrane normal.  Eyes:     Pupils: Pupils are equal, round, and reactive to light.  Neck:     Thyroid : No thyromegaly.  Cardiovascular:     Rate and Rhythm: Normal rate and regular rhythm.     Heart sounds: Normal heart sounds. No murmur heard. Pulmonary:  Effort: Pulmonary effort is normal. No respiratory distress.     Breath sounds: Normal breath sounds. No wheezing.  Abdominal:     General: Bowel sounds are normal. There is no distension.     Palpations: Abdomen is soft.     Tenderness: There is no abdominal tenderness.  Musculoskeletal:        General: No tenderness. Normal range of  motion.     Cervical back: Normal range of motion and neck supple.  Skin:    General: Skin is warm and dry.  Neurological:     Mental Status: She is alert and oriented to person, place, and time.     Cranial Nerves: No cranial nerve deficit.     Deep Tendon Reflexes: Reflexes are normal and symmetric.  Psychiatric:        Behavior: Behavior normal.        Thought Content: Thought content normal.        Judgment: Judgment normal.       BP 121/66   Pulse 66   Temp (!) 97.3 F (36.3 C) (Temporal)   Ht 5\' 2"  (1.575 m)   Wt 204 lb 6.4 oz (92.7 kg)   SpO2 97%   BMI 37.39 kg/m      Assessment & Plan:  Danielle Ellis comes in today with chief complaint of Medical Management of Chronic Issues   Diagnosis and orders addressed:  1. Hypertension, unspecified type (Primary) - tirzepatide (MOUNJARO) 5 MG/0.5ML Pen; Inject 5 mg into the skin once a week.  Dispense: 6 mL; Refill: 2 - CMP14+EGFR  2. Hyperlipidemia associated with type 2 diabetes mellitus (HCC) - tirzepatide (MOUNJARO) 5 MG/0.5ML Pen; Inject 5 mg into the skin once a week.  Dispense: 6 mL; Refill: 2 - CMP14+EGFR  3. Depression, major, single episode, mild (HCC) - CMP14+EGFR  4. GAD (generalized anxiety disorder) - CMP14+EGFR  5. S/P CABG x 5 - tirzepatide (MOUNJARO) 5 MG/0.5ML Pen; Inject 5 mg into the skin once a week.  Dispense: 6 mL; Refill: 2 - CMP14+EGFR  6. Coronary artery disease involving native coronary artery of native heart without angina pectoris  - tirzepatide (MOUNJARO) 5 MG/0.5ML Pen; Inject 5 mg into the skin once a week.  Dispense: 6 mL; Refill: 2 - CMP14+EGFR  7. History of non-ST elevation myocardial infarction (NSTEMI) - tirzepatide (MOUNJARO) 5 MG/0.5ML Pen; Inject 5 mg into the skin once a week.  Dispense: 6 mL; Refill: 2 - CMP14+EGFR  8. Morbid obesity (HCC)  - tirzepatide (MOUNJARO) 5 MG/0.5ML Pen; Inject 5 mg into the skin once a week.  Dispense: 6 mL; Refill: 2 -  CMP14+EGFR  9. Type 2 diabetes mellitus with other specified complication, without long-term current use of insulin  (HCC) - tirzepatide (MOUNJARO) 5 MG/0.5ML Pen; Inject 5 mg into the skin once a week.  Dispense: 6 mL; Refill: 2 - CMP14+EGFR  10. Post-menopausal - DG [facility] DEXA   Labs pending Will change Ozempic  2 mg to Mounjaro 5 mg  Continue current medications  Health Maintenance reviewed Diet and exercise encouraged  Follow up plan: 2 months to recheck Connor Deiters, FNP

## 2024-01-17 NOTE — Patient Instructions (Signed)
 Health Maintenance After Age 70 After age 4, you are at a higher risk for certain long-term diseases and infections as well as injuries from falls. Falls are a major cause of broken bones and head injuries in people who are older than age 47. Getting regular preventive care can help to keep you healthy and well. Preventive care includes getting regular testing and making lifestyle changes as recommended by your health care provider. Talk with your health care provider about: Which screenings and tests you should have. A screening is a test that checks for a disease when you have no symptoms. A diet and exercise plan that is right for you. What should I know about screenings and tests to prevent falls? Screening and testing are the best ways to find a health problem early. Early diagnosis and treatment give you the best chance of managing medical conditions that are common after age 37. Certain conditions and lifestyle choices may make you more likely to have a fall. Your health care provider may recommend: Regular vision checks. Poor vision and conditions such as cataracts can make you more likely to have a fall. If you wear glasses, make sure to get your prescription updated if your vision changes. Medicine review. Work with your health care provider to regularly review all of the medicines you are taking, including over-the-counter medicines. Ask your health care provider about any side effects that may make you more likely to have a fall. Tell your health care provider if any medicines that you take make you feel dizzy or sleepy. Strength and balance checks. Your health care provider may recommend certain tests to check your strength and balance while standing, walking, or changing positions. Foot health exam. Foot pain and numbness, as well as not wearing proper footwear, can make you more likely to have a fall. Screenings, including: Osteoporosis screening. Osteoporosis is a condition that causes  the bones to get weaker and break more easily. Blood pressure screening. Blood pressure changes and medicines to control blood pressure can make you feel dizzy. Depression screening. You may be more likely to have a fall if you have a fear of falling, feel depressed, or feel unable to do activities that you used to do. Alcohol use screening. Using too much alcohol can affect your balance and may make you more likely to have a fall. Follow these instructions at home: Lifestyle Do not drink alcohol if: Your health care provider tells you not to drink. If you drink alcohol: Limit how much you have to: 0-1 drink a day for women. 0-2 drinks a day for men. Know how much alcohol is in your drink. In the [place] one drink equals one 12 oz bottle of beer (355 mL), one 5 oz glass of wine (148 mL), or one 1 oz glass of hard liquor (44 mL). Do not use any products that contain nicotine or tobacco. These products include cigarettes, chewing tobacco, and vaping devices, such as e-cigarettes. If you need help quitting, ask your health care provider. Activity  Follow a regular exercise program to stay fit. This will help you maintain your balance. Ask your health care provider what types of exercise are appropriate for you. If you need a cane or walker, use it as recommended by your health care provider. Wear supportive shoes that have nonskid soles. Safety  Remove any tripping hazards, such as rugs, cords, and clutter. Install safety equipment such as grab bars in bathrooms and safety rails on stairs. Keep rooms and walkways  well-lit. General instructions Talk with your health care provider about your risks for falling. Tell your health care provider if: You fall. Be sure to tell your health care provider about all falls, even ones that seem minor. You feel dizzy, tiredness (fatigue), or off-balance. Take over-the-counter and prescription medicines only as told by your health care provider. These include  supplements. Eat a healthy diet and maintain a healthy weight. A healthy diet includes low-fat dairy products, low-fat (lean) meats, and fiber from whole grains, beans, and lots of fruits and vegetables. Stay current with your vaccines. Schedule regular health, dental, and eye exams. Summary Having a healthy lifestyle and getting preventive care can help to protect your health and wellness after age 11. Screening and testing are the best way to find a health problem early and help you avoid having a fall. Early diagnosis and treatment give you the best chance for managing medical conditions that are more common for people who are older than age 28. Falls are a major cause of broken bones and head injuries in people who are older than age 48. Take precautions to prevent a fall at home. Work with your health care provider to learn what changes you can make to improve your health and wellness and to prevent falls. This information is not intended to replace advice given to you by your health care provider. Make sure you discuss any questions you have with your health care provider. Document Revised: 12/22/2020 Document Reviewed: 12/22/2020 Elsevier Patient Education  2024 [employer].

## 2024-01-17 NOTE — Telephone Encounter (Signed)
 Pharmacy Patient Advocate Encounter   Received notification from CoverMyMeds that prior authorization for Mounjaro 5 is required/requested.   Insurance verification completed.   The patient is insured through [employer] .   Per test claim: PA required; PA submitted to above mentioned insurance via CoverMyMeds Key/confirmation #/EOC [id number] Status is pending

## 2024-01-17 NOTE — Progress Notes (Signed)
 Danielle Ellis arrived 01/17/2024 and has given verbal consent to obtain images and complete their overdue diabetic retinal screening.  The images have been sent to an ophthalmologist or optometrist for review and interpretation.  Results will be sent back to Yevette Hem, FNP for review.  Patient has been informed they will be contacted when we receive the results via telephone or MyChart

## 2024-01-17 NOTE — Telephone Encounter (Signed)
 Pharmacy Patient Advocate Encounter  Received notification from RX Advance that Prior Authorization for Mounjaro 5 has been APPROVED from 01/17/24 to 01/16/25. Ran test claim, Copay is $0.00. This test claim was processed through [facility]- copay amounts may vary at other pharmacies due to pharmacy/plan contracts, or as the patient moves through the different stages of their insurance plan.   PA #/Case ID/Reference #: [id number]

## 2024-01-18 ENCOUNTER — Other Ambulatory Visit (HOSPITAL_COMMUNITY): Payer: Self-pay

## 2024-01-18 ENCOUNTER — Other Ambulatory Visit: Payer: Self-pay

## 2024-01-18 DIAGNOSIS — Z780 Asymptomatic menopausal state: Secondary | ICD-10-CM | POA: Diagnosis not present

## 2024-01-19 ENCOUNTER — Ambulatory Visit: Payer: PPO | Admitting: Family

## 2024-01-20 DIAGNOSIS — G4733 Obstructive sleep apnea (adult) (pediatric): Secondary | ICD-10-CM | POA: Diagnosis not present

## 2024-01-21 ENCOUNTER — Other Ambulatory Visit: Payer: Self-pay | Admitting: Cardiology

## 2024-01-21 DIAGNOSIS — Z951 Presence of aortocoronary bypass graft: Secondary | ICD-10-CM

## 2024-01-21 DIAGNOSIS — I2510 Atherosclerotic heart disease of native coronary artery without angina pectoris: Secondary | ICD-10-CM

## 2024-01-21 DIAGNOSIS — E782 Mixed hyperlipidemia: Secondary | ICD-10-CM

## 2024-01-23 ENCOUNTER — Ambulatory Visit: Payer: Self-pay | Admitting: Family

## 2024-01-23 ENCOUNTER — Other Ambulatory Visit: Payer: Self-pay

## 2024-01-23 ENCOUNTER — Other Ambulatory Visit (HOSPITAL_COMMUNITY): Payer: Self-pay

## 2024-01-25 ENCOUNTER — Other Ambulatory Visit (HOSPITAL_COMMUNITY): Payer: Self-pay

## 2024-02-08 ENCOUNTER — Ambulatory Visit (INDEPENDENT_AMBULATORY_CARE_PROVIDER_SITE_OTHER): Admitting: Gastroenterology

## 2024-02-08 ENCOUNTER — Encounter (INDEPENDENT_AMBULATORY_CARE_PROVIDER_SITE_OTHER): Payer: Self-pay | Admitting: Gastroenterology

## 2024-02-08 VITALS — BP 129/74 | HR 86 | Temp 97.5°F | Ht 61.0 in | Wt 206.0 lb

## 2024-02-08 DIAGNOSIS — R7401 Elevation of levels of liver transaminase levels: Secondary | ICD-10-CM | POA: Diagnosis not present

## 2024-02-08 DIAGNOSIS — K7581 Nonalcoholic steatohepatitis (NASH): Secondary | ICD-10-CM | POA: Insufficient documentation

## 2024-02-08 DIAGNOSIS — R7989 Other specified abnormal findings of blood chemistry: Secondary | ICD-10-CM

## 2024-02-08 NOTE — Patient Instructions (Addendum)
 Continue working on weight loss and preventing a Mediterranean diet, avoiding carbohydrates as much as possible Discussed with PCP possibility of switching Lexapro  to another medication Ask PCP about hepatitis A and B vaccine

## 2024-02-08 NOTE — Progress Notes (Signed)
 Toribio Fortune, M.D. Gastroenterology & Hepatology [facility] [address] [place], [place] [address]  Primary Care Physician: Lavell Bari LABOR, FNP [address] [place] [address]  I will communicate my assessment and recommendations to the referring MD via EMR.  Problems: Elevated LFTs, possibly related to NASH  History of Present Illness: Danielle Ellis is a 68 y.o. female with PMH anxiety, CAD s/p CABG, OSA, HTN, DM, who presents for evaluation of elevated LFTs.   The patient was last seen on 10/20/2023. At that time, the patient had blood workup for evaluation of elevated LFTs.  The patient denies any complaints such as nausea, vomiting, fever, chills, hematochezia, melena, hematemesis, abdominal distention, abdominal pain, diarrhea, jaundice, pruritus or weight loss.  Labs performed on 10/20/2023 showed normal CBC, CMP with elevation of ALT up to 110, AST 74, alkaline phosphatase 170, total bilirubin 0.5, rest within normal limits, normal IgA, smooth muscle antibody, ANA, IgM, microcolon antibody.  Ferritin was slightly elevated at 174.  Normal celiac disease panel.  Negative hepatitis A/B antibodies.  Patient was advised to stop using diclofenac  and to possibly switch her Lexapro .  Patient was advised to proceed with liver biopsy but she wants to hold off on this.  Repeat LFTs on 01/17/2024 showed AST of 41, ALT 89, alkaline phosphatase 566, total bilirubin 0.5.  Does not take any herbs or supplements.  Given presence of pancreatic lesions and possible IPMN, we will need to have MRCP in 03/2025.  Patient was switched from Ozempic  to Mounjaro  recently (2 weeks).  Belives she lost 15 lb with Ozempic .  Last ZHI:wzczm Last Colonoscopy:07/30/2015 - diverticulosis and internal hemorrhoids  Past Medical History: Past Medical History:  Diagnosis Date   Anxiety    Hypertension    Sleep apnea     Past Surgical History: Past  Surgical History:  Procedure Laterality Date   APPENDECTOMY     CORONARY ARTERY BYPASS GRAFT N/A 10/14/2020   Procedure: CORONARY ARTERY BYPASS GRAFTING (CABG) X FIVE, USING LEFT INTERNAL MAMMARY ARTERY AND BILATERAL LEGS GREATER SAPHENOUS VEINS HARVESTED ENDOSCOPICALLY;  Surgeon: Lucas Dorise POUR, MD;  Location: [facility] OR;  Service: Open Heart Surgery;  Laterality: N/A;   L ankle surgery     LEFT HEART CATH AND CORONARY ANGIOGRAPHY N/A 10/10/2020   Procedure: LEFT HEART CATH AND CORONARY ANGIOGRAPHY;  Surgeon: [place]az[place], Peter M, MD;  Location: [facility] INVASIVE CV LAB;  Service: Cardiovascular;  Laterality: N/A;   TEE WITHOUT CARDIOVERSION N/A 10/14/2020   Procedure: TRANSESOPHAGEAL ECHOCARDIOGRAM (TEE);  Surgeon: Lucas Dorise POUR, MD;  Location: [facility] OR;  Service: Open Heart Surgery;  Laterality: N/A;    Family History: Family History  Problem Relation Age of Onset   Diabetes Mother    Kidney disease Mother    Cancer Mother    Heart attack Mother     Social History: Social History   Tobacco Use  Smoking Status Never  Smokeless Tobacco Never   Social History   Substance and Sexual Activity  Alcohol Use No   Social History   Substance and Sexual Activity  Drug Use No    Allergies: No Known Allergies  Medications: Current Outpatient Medications  Medication Sig Dispense Refill   amLODipine  (NORVASC ) 5 MG tablet Take 1 tablet (5 mg total) by mouth daily. 90 tablet 2   baclofen  (LIORESAL ) 10 MG tablet Take 1 tablet (10 mg total) by mouth 3 (three) times daily. 30 each 0   blood glucose meter kit  and supplies Dispense based on patient and insurance preference. Use up to four times daily as directed. (FOR ICD-10 E10.9, E11.9). 1 each 0   escitalopram  (LEXAPRO ) 10 MG tablet Take 1 tablet (10 mg total) by mouth daily. 90 tablet 0   Evolocumab  (REPATHA  SURECLICK) 140 MG/ML SOAJ INJECT 140 MG INTO THE SKIN EVERY 14 (FOURTEEN) DAYS. 6 mL 3   fluticasone  (FLONASE ) 50 MCG/ACT nasal spray Place 1  spray into both nostrils daily. 48 g 1   glucose blood (ONETOUCH ULTRA) test strip Test blood sugar 4 times daily as directed. 400 strip 3   latanoprost  (XALATAN ) 0.005 % ophthalmic solution Place 1 drop into both eyes daily 2.5 mL 6   losartan  (COZAAR ) 100 MG tablet Take 1 tablet (100 mg total) by mouth daily. 90 tablet 3   metoprolol  succinate (TOPROL -XL) 100 MG 24 hr tablet Take 1 tablet (100 mg total) by mouth daily. Take with or immediately following a meal. 90 tablet 3   rosuvastatin  (CRESTOR ) 10 MG tablet Take 0.5 tablets (5 mg total) by mouth daily. 45 tablet 3   tirzepatide  (MOUNJARO ) 5 MG/0.5ML Pen Inject 5 mg into the skin once a week. 6 mL 2   No current facility-administered medications for this visit.    Review of Systems: GENERAL: negative for malaise, night sweats HEENT: No changes in hearing or vision, no nose bleeds or other nasal problems. NECK: Negative for lumps, goiter, pain and significant neck swelling RESPIRATORY: Negative for cough, wheezing CARDIOVASCULAR: Negative for chest pain, leg swelling, palpitations, orthopnea GI: SEE HPI MUSCULOSKELETAL: Negative for joint pain or swelling, back pain, and muscle pain. SKIN: Negative for lesions, rash PSYCH: Negative for sleep disturbance, mood disorder and recent psychosocial stressors. HEMATOLOGY Negative for prolonged bleeding, bruising easily, and swollen nodes. ENDOCRINE: Negative for cold or heat intolerance, polyuria, polydipsia and goiter. NEURO: negative for tremor, gait imbalance, syncope and seizures. The remainder of the review of systems is noncontributory.   Physical Exam: BP 129/74 (BP Location: Left Arm, Patient Position: Sitting, Cuff Size: Large)   Pulse 86   Temp (!) 97.5 F (36.4 C) (Temporal)   Ht 5' 1 (1.549 m)   Wt 206 lb (93.4 kg)   BMI 38.92 kg/m  GENERAL: The patient is AO x3, in no acute distress. Obese. HEENT: Head is normocephalic and atraumatic. EOMI are intact. Mouth is well  hydrated and without lesions. NECK: Supple. No masses LUNGS: Clear to auscultation. No presence of rhonchi/wheezing/rales. Adequate chest expansion HEART: RRR, normal s1 and s2. ABDOMEN: Soft, nontender, no guarding, no peritoneal signs, and nondistended. BS +. No masses. EXTREMITIES: Without any cyanosis, clubbing, rash, lesions or edema. NEUROLOGIC: AOx3, no focal motor deficit. SKIN: no jaundice, no rashes  Imaging/Labs: as above  I personally reviewed and interpreted the available labs, imaging and endoscopic files.  Impression and Plan: Danielle Ellis is a 68 y.o. female with PMH anxiety, CAD s/p CABG, OSA, HTN, DM, who presents for evaluation of elevated LFTs.  The patient has presented chronic elevation of her aminotransferases, for which she underwent recent blood workup that did not show any significant elevation explain her abnormalities.  We discussed that it is possible her mild elevation of LFTs is related to NASH and/or mild DILI due to Lexapro .  Due to these, I advised her to discuss with her PCP the possibility of switching her Lexapro  to another agent, but she will also need to continue working on weight loss and intermittent Mediterranean diet.  We have  previously discussed the possibility of pursuing a liver biopsy but she does not want to perform this, which I consider is reasonable given the fact her LFTs have decreased with weight loss.  -Continue working on weight loss and preventing a Mediterranean diet, avoiding carbohydrates as much as possible -Discussed with PCP possibility of switching Lexapro  to another medication -Ask PCP about hepatitis A and B vaccine  All questions were answered.      Toribio Fortune, MD Gastroenterology and Hepatology [facility]

## 2024-02-09 ENCOUNTER — Telehealth: Payer: Self-pay

## 2024-02-09 NOTE — Telephone Encounter (Signed)
 Does the pt need to be seen for titers? Do you prefer an appt to discuss changing Lexapro 

## 2024-02-09 NOTE — Telephone Encounter (Signed)
 Copied from CRM [id number]. Topic: General - Other >> Feb 09, 2024 10:56 AM Danielle Ellis wrote: Reason for CRM: Patient called.. had question if she needs measles vaccine.SABRA and if she needs hep a and b .SABRA Per the gastro doctor.SABRA and she needs change of lexapro  med to something else

## 2024-02-10 ENCOUNTER — Telehealth: Payer: Self-pay

## 2024-02-10 NOTE — Telephone Encounter (Signed)
 Needs appointment

## 2024-02-10 NOTE — Telephone Encounter (Signed)
 Copied from CRM [id number]. Topic: General - Other >> Feb 09, 2024 10:56 AM Emylou G wrote: Reason for CRM: Patient called.. had question if she needs measles vaccine.SABRA and if she needs hep a and b .SABRA Per the gastro doctor.SABRA and she needs change of lexapro  med to something else >> Feb 10, 2024 10:59 AM Powell HERO wrote: Patient calling in again to follow up. I let her know that I would relay a message that she would like to be called back today to discuss if vaccines are needed, or if she needs to make an appointment with Bari Learn about changing medications.

## 2024-02-10 NOTE — Telephone Encounter (Signed)
 Appt made

## 2024-02-16 ENCOUNTER — Encounter: Payer: Self-pay | Admitting: Family

## 2024-02-16 ENCOUNTER — Ambulatory Visit (INDEPENDENT_AMBULATORY_CARE_PROVIDER_SITE_OTHER): Admitting: Family

## 2024-02-16 ENCOUNTER — Other Ambulatory Visit: Payer: Self-pay

## 2024-02-16 ENCOUNTER — Other Ambulatory Visit (HOSPITAL_COMMUNITY): Payer: Self-pay

## 2024-02-16 VITALS — BP 111/59 | HR 67 | Temp 97.9°F | Ht 61.0 in | Wt 207.6 lb

## 2024-02-16 DIAGNOSIS — Z0184 Encounter for antibody response examination: Secondary | ICD-10-CM | POA: Diagnosis not present

## 2024-02-16 DIAGNOSIS — Z951 Presence of aortocoronary bypass graft: Secondary | ICD-10-CM | POA: Diagnosis not present

## 2024-02-16 DIAGNOSIS — F320 Major depressive disorder, single episode, mild: Secondary | ICD-10-CM

## 2024-02-16 DIAGNOSIS — I252 Old myocardial infarction: Secondary | ICD-10-CM | POA: Diagnosis not present

## 2024-02-16 DIAGNOSIS — E782 Mixed hyperlipidemia: Secondary | ICD-10-CM | POA: Diagnosis not present

## 2024-02-16 DIAGNOSIS — I10 Essential (primary) hypertension: Secondary | ICD-10-CM | POA: Diagnosis not present

## 2024-02-16 DIAGNOSIS — I2510 Atherosclerotic heart disease of native coronary artery without angina pectoris: Secondary | ICD-10-CM

## 2024-02-16 DIAGNOSIS — K7581 Nonalcoholic steatohepatitis (NASH): Secondary | ICD-10-CM

## 2024-02-16 DIAGNOSIS — F411 Generalized anxiety disorder: Secondary | ICD-10-CM

## 2024-02-16 DIAGNOSIS — R7303 Prediabetes: Secondary | ICD-10-CM

## 2024-02-16 MED ORDER — TIRZEPATIDE 7.5 MG/0.5ML ~~LOC~~ SOAJ
7.5000 mg | SUBCUTANEOUS | 2 refills | Status: DC
  Filled 2024-02-16: qty 6, 84d supply, fill #0

## 2024-02-16 MED ORDER — ESCITALOPRAM OXALATE 5 MG PO TABS
5.0000 mg | ORAL_TABLET | Freq: Every day | ORAL | 1 refills | Status: DC
  Filled 2024-02-16: qty 30, 30d supply, fill #0

## 2024-02-16 NOTE — Patient Instructions (Signed)
 Fatty Liver Disease (Steatotic Liver Disease): What to Know  Your liver is an organ with many jobs. It makes proteins and helps change food into energy. It also gets rid of harmful things in your blood and absorbs vitamins from food. Fatty liver disease happens when too much fat builds up in your liver cells. It's also called steatotic liver disease. In many cases, fatty liver disease doesn't cause symptoms. But over time, it can cause irritation and swelling. This can lead to other liver problems, such as: Cirrhosis, or scarring of the liver. Liver cancer. Liver failure. What are the causes? Fatty liver disease may be caused by: Being overweight. Having: High cholesterol. High blood pressure. Cushing syndrome. Not getting enough nutrients in your diet. Other causes include: Certain drugs. Poisons. Some infections caused by a germ called a virus. What increases the risk? You're more likely to get fatty liver disease if: You drink alcohol. You're overweight. You have diabetes. You have hepatitis. You have a high triglyceride level. You're pregnant. What are the signs or symptoms? You may not have symptoms. If you do, they may include: Feeling weak and tired. Losing weight. Feeling like you may throw up. Throwing up. Jaundice. This is when your skin or the white parts of your eyes turn yellow. Swelling in your belly or legs. Tenderness in the right-upper part of your belly. How is this diagnosed? Fatty liver disease may be diagnosed based on your medical history and an exam. You may also need tests. These may include: Blood tests. An ultrasound. A CT scan. An MRI. A biopsy. This is when a small piece of tissue is removed from your liver for testing. How is this treated? Fatty liver disease is often caused by other conditions. You may need to take medicines and make changes to your daily life. These changes may help you manage conditions, such as: Alcohol use disorder. This  is a condition where you may not be able to stop drinking. High cholesterol. Diabetes. Being overweight. Follow these instructions at home: Eat healthy. Work with your health care provider or an expert in healthy eating called a dietitian. They can help you make an eating plan. Get enough exercise. This can help you lose weight. It can also help you manage your cholesterol and diabetes. Talk to your provider about an exercise plan. Ask what things are best for you to do. Do not drink alcohol. If you have trouble quitting, ask your provider for help. Take your medicines only as told. Keep all follow-up visits. Your provider will check if you're getting better. Contact a health care provider if: You can't control your blood sugar. This is extra important if you have diabetes. You have a fever. You have swelling in your belly or legs. You have belly pain. You have jaundice. You feel like you may throw up. You throw up. Get help right away if: You throw up, and it looks like: Bright red blood. Coffee grounds. You throw up something that looks like coffee ground. Your poop looks bloody or black. You get confused. These symptoms may be an emergency. Call 911 right away. Do not wait to see if the symptoms will go away. Do not drive yourself to the hospital. This information is not intended to replace advice given to you by your health care provider. Make sure you discuss any questions you have with your health care provider. Document Revised: 01/20/2023 Document Reviewed: 01/20/2023 Elsevier Patient Education  2024 [employer].

## 2024-02-16 NOTE — Progress Notes (Signed)
 Subjective:    Patient ID: Danielle Ellis, female    DOB: Dec 14, 1955, 68 y.o.   MRN: [id number]  Chief Complaint  Patient presents with   Medical Management of Chronic Issues    Wants to come off lexapro , MMR titters needed check hep a and b     Pt present to the office today chronic follow up.   She is followed by Cardiologists every 6 months  for NSTEMI on 10/09/20.   She is followed by GI for fatty liver. Recommended her Hep A and Hep B immunity be checked.    She was started Ozempic  2 mg. She had lost 12 lbs. She is morbid obese with a BMI of 39.  with HTN and DM. We changed this to Mounjaro  5 mg. She has gained weight. Her starting 228 lbs.      02/16/2024   11:40 AM 02/08/2024    3:41 PM 01/17/2024    8:01 AM  Last 3 Weights  Weight (lbs) 207 lb 9.6 oz 206 lb 204 lb 6.4 oz  Weight (kg) 94.167 kg 93.441 kg 92.715 kg    She reports she wants to change her lexapro  10 mg.  Hypertension This is a chronic problem. The current episode started more than 1 year ago. The problem has been resolved since onset. The problem is controlled. Associated symptoms include anxiety. Pertinent negatives include no blurred vision, malaise/fatigue, peripheral edema or shortness of breath. Risk factors for coronary artery disease include dyslipidemia, diabetes mellitus, obesity and sedentary lifestyle. The current treatment provides moderate improvement.  Diabetes She presents for her follow-up diabetic visit. She has type 2 diabetes mellitus. Hypoglycemia symptoms include nervousness/anxiousness. Pertinent negatives for diabetes include no blurred vision, no fatigue and no foot paresthesias. Symptoms are stable. Risk factors for coronary artery disease include dyslipidemia, diabetes mellitus, hypertension and sedentary lifestyle. She is following a generally healthy diet. Her overall blood glucose range is 90-110 mg/dl. Eye exam is current.  Hyperlipidemia This is a chronic problem. The current episode  started more than 1 year ago. The problem is controlled. Recent lipid tests were reviewed and are normal. Exacerbating diseases include obesity. Pertinent negatives include no shortness of breath. Current antihyperlipidemic treatment includes statins. The current treatment provides moderate improvement of lipids. Risk factors for coronary artery disease include diabetes mellitus, hypertension, a sedentary lifestyle, post-menopausal, dyslipidemia and obesity.  Depression        This is a chronic problem.  The current episode started more than 1 year ago.   The problem occurs intermittently.  Associated symptoms include no fatigue, no helplessness, no hopelessness and not sad.  Past treatments include SSRIs - Selective serotonin reuptake inhibitors.  Past medical history includes anxiety.   Anxiety Presents for follow-up visit. Symptoms include excessive worry and nervous/anxious behavior. Patient reports no hyperventilation or shortness of breath. Symptoms occur occasionally. The severity of symptoms is mild.        Review of Systems  Constitutional:  Negative for fatigue and malaise/fatigue.  Eyes:  Negative for blurred vision.  Respiratory:  Negative for shortness of breath.   Psychiatric/Behavioral:  The patient is nervous/anxious.   All other systems reviewed and are negative.      Objective:   Physical Exam Vitals reviewed.  Constitutional:      General: She is not in acute distress.    Appearance: She is well-developed. She is obese.  HENT:     Head: Normocephalic and atraumatic.     Right Ear: Tympanic  membrane normal.     Left Ear: Tympanic membrane normal.  Eyes:     Pupils: Pupils are equal, round, and reactive to light.  Neck:     Thyroid : No thyromegaly.  Cardiovascular:     Rate and Rhythm: Normal rate and regular rhythm.     Heart sounds: Normal heart sounds. No murmur heard. Pulmonary:     Effort: Pulmonary effort is normal. No respiratory distress.     Breath  sounds: Normal breath sounds. No wheezing.  Abdominal:     General: Bowel sounds are normal. There is no distension.     Palpations: Abdomen is soft.     Tenderness: There is no abdominal tenderness.  Musculoskeletal:        General: No tenderness. Normal range of motion.     Cervical back: Normal range of motion and neck supple.  Skin:    General: Skin is warm and dry.  Neurological:     Mental Status: She is alert and oriented to person, place, and time.     Cranial Nerves: No cranial nerve deficit.     Deep Tendon Reflexes: Reflexes are normal and symmetric.  Psychiatric:        Behavior: Behavior normal.        Thought Content: Thought content normal.        Judgment: Judgment normal.       BP (!) 111/59   Pulse 67   Temp 97.9 F (36.6 C) (Temporal)   Ht 5' 1 (1.549 m)   Wt 207 lb 9.6 oz (94.2 kg)   BMI 39.23 kg/m      Assessment & Plan:  Danielle Ellis comes in today with chief complaint of Medical Management of Chronic Issues (Wants to come off lexapro , MMR titters needed check hep a and b )   Diagnosis and orders addressed:  1. Hypertension, unspecified type (Primary)  2. Mixed hyperlipidemia  3. Depression, major, single episode, mild (HCC) - escitalopram  (LEXAPRO ) 5 MG tablet; Take 1 tablet (5 mg total) by mouth daily.  Dispense: 30 tablet; Refill: 1  4. GAD (generalized anxiety disorder)  - escitalopram  (LEXAPRO ) 5 MG tablet; Take 1 tablet (5 mg total) by mouth daily.  Dispense: 30 tablet; Refill: 1  5. S/P CABG x 5 - tirzepatide  (MOUNJARO ) 7.5 MG/0.5ML Pen; Inject 7.5 mg into the skin once a week.  Dispense: 6 mL; Refill: 2  6. Coronary artery disease involving native coronary artery of native heart without angina pectoris - tirzepatide  (MOUNJARO ) 7.5 MG/0.5ML Pen; Inject 7.5 mg into the skin once a week.  Dispense: 6 mL; Refill: 2  7. History of non-ST elevation myocardial infarction (NSTEMI)  - tirzepatide  (MOUNJARO ) 7.5 MG/0.5ML Pen; Inject  7.5 mg into the skin once a week.  Dispense: 6 mL; Refill: 2  8. Morbid obesity (HCC) - tirzepatide  (MOUNJARO ) 7.5 MG/0.5ML Pen; Inject 7.5 mg into the skin once a week.  Dispense: 6 mL; Refill: 2  9. NASH (nonalcoholic steatohepatitis) - tirzepatide  (MOUNJARO ) 7.5 MG/0.5ML Pen; Inject 7.5 mg into the skin once a week.  Dispense: 6 mL; Refill: 2  10. Immunity status testing  - Measles/Mumps/Rubella Immunity - Hepatitis B surface antibody,quantitative - Hepatitis A Antibody, Total  11. Prediabetes - tirzepatide  (MOUNJARO ) 7.5 MG/0.5ML Pen; Inject 7.5 mg into the skin once a week.  Dispense: 6 mL; Refill: 2   Labs pending Will decrease Lexapro  to 5 mg from 10 mg. Take for 2 weeks then decrease to 2.5 mg for two  weeks then stop.  Will change  Mounjaro  5 mg to 7.5 mg Continue current medications  Health Maintenance reviewed Diet and exercise encouraged  Follow up plan: Keep follow up appt   Bari Learn, FNP

## 2024-02-17 LAB — MEASLES/MUMPS/RUBELLA IMMUNITY
MUMPS ABS, IGG: <9 [AU]/ml — ABNORMAL LOW (ref >10.9)
RUBEOLA AB, IGG: >300 [AU]/ml (ref >16.4)
Rubella Antibodies, IGG: 9.1 {index} (ref >0.99)

## 2024-02-17 LAB — HEPATITIS B SURFACE ANTIBODY, QUANTITATIVE: Hepatitis B Surf Ab Quant: <3.5 m[IU]/mL — ABNORMAL LOW

## 2024-02-17 LAB — HEPATITIS A ANTIBODY, TOTAL: hep A Total Ab: NEGATIVE

## 2024-02-20 ENCOUNTER — Ambulatory Visit: Payer: Self-pay | Admitting: Family

## 2024-02-27 ENCOUNTER — Other Ambulatory Visit (HOSPITAL_COMMUNITY): Payer: Self-pay

## 2024-02-27 ENCOUNTER — Other Ambulatory Visit: Payer: Self-pay

## 2024-02-27 ENCOUNTER — Other Ambulatory Visit: Payer: Self-pay | Admitting: Family

## 2024-02-27 DIAGNOSIS — I10 Essential (primary) hypertension: Secondary | ICD-10-CM

## 2024-02-27 MED ORDER — AMLODIPINE BESYLATE 5 MG PO TABS
5.0000 mg | ORAL_TABLET | Freq: Every day | ORAL | 0 refills | Status: DC
  Filled 2024-02-27: qty 90, 90d supply, fill #0

## 2024-02-28 ENCOUNTER — Other Ambulatory Visit: Payer: Self-pay | Admitting: Family

## 2024-02-28 ENCOUNTER — Other Ambulatory Visit (HOSPITAL_COMMUNITY): Payer: Self-pay

## 2024-02-28 ENCOUNTER — Other Ambulatory Visit: Payer: Self-pay

## 2024-02-28 DIAGNOSIS — I10 Essential (primary) hypertension: Secondary | ICD-10-CM

## 2024-02-28 DIAGNOSIS — G4733 Obstructive sleep apnea (adult) (pediatric): Secondary | ICD-10-CM | POA: Diagnosis not present

## 2024-02-28 MED ORDER — AMLODIPINE BESYLATE 5 MG PO TABS
5.0000 mg | ORAL_TABLET | Freq: Every day | ORAL | 0 refills | Status: DC
  Filled 2024-02-28 – 2024-05-28 (×2): qty 90, 90d supply, fill #0

## 2024-02-29 ENCOUNTER — Other Ambulatory Visit (HOSPITAL_COMMUNITY): Payer: Self-pay

## 2024-03-01 ENCOUNTER — Other Ambulatory Visit: Payer: Self-pay

## 2024-03-01 ENCOUNTER — Other Ambulatory Visit: Payer: Self-pay | Admitting: Family

## 2024-03-01 MED ORDER — FLUTICASONE PROPIONATE 50 MCG/ACT NA SUSP
1.0000 | Freq: Every day | NASAL | 1 refills | Status: AC
  Filled 2024-03-01: qty 16, 60d supply, fill #0
  Filled 2024-05-28: qty 16, 60d supply, fill #1
  Filled 2024-07-23: qty 16, 60d supply, fill #2
  Filled 2024-09-21: qty 16, 60d supply, fill #3

## 2024-03-20 ENCOUNTER — Other Ambulatory Visit: Payer: Self-pay

## 2024-03-20 ENCOUNTER — Encounter: Payer: Self-pay | Admitting: Family

## 2024-03-20 ENCOUNTER — Ambulatory Visit (INDEPENDENT_AMBULATORY_CARE_PROVIDER_SITE_OTHER): Admitting: Family

## 2024-03-20 ENCOUNTER — Other Ambulatory Visit (HOSPITAL_COMMUNITY): Payer: Self-pay

## 2024-03-20 VITALS — BP 106/67 | HR 67 | Temp 98.0°F | Ht 61.0 in | Wt 207.0 lb

## 2024-03-20 DIAGNOSIS — I2510 Atherosclerotic heart disease of native coronary artery without angina pectoris: Secondary | ICD-10-CM

## 2024-03-20 DIAGNOSIS — K7581 Nonalcoholic steatohepatitis (NASH): Secondary | ICD-10-CM

## 2024-03-20 DIAGNOSIS — F411 Generalized anxiety disorder: Secondary | ICD-10-CM | POA: Diagnosis not present

## 2024-03-20 DIAGNOSIS — I10 Essential (primary) hypertension: Secondary | ICD-10-CM

## 2024-03-20 DIAGNOSIS — Z0001 Encounter for general adult medical examination with abnormal findings: Secondary | ICD-10-CM | POA: Diagnosis not present

## 2024-03-20 DIAGNOSIS — F320 Major depressive disorder, single episode, mild: Secondary | ICD-10-CM | POA: Diagnosis not present

## 2024-03-20 DIAGNOSIS — Z0000 Encounter for general adult medical examination without abnormal findings: Secondary | ICD-10-CM

## 2024-03-20 DIAGNOSIS — E0849 Diabetes mellitus due to underlying condition with other diabetic neurological complication: Secondary | ICD-10-CM | POA: Diagnosis not present

## 2024-03-20 DIAGNOSIS — Z23 Encounter for immunization: Secondary | ICD-10-CM

## 2024-03-20 DIAGNOSIS — Z951 Presence of aortocoronary bypass graft: Secondary | ICD-10-CM

## 2024-03-20 DIAGNOSIS — I252 Old myocardial infarction: Secondary | ICD-10-CM

## 2024-03-20 DIAGNOSIS — E782 Mixed hyperlipidemia: Secondary | ICD-10-CM

## 2024-03-20 LAB — LIPID PANEL

## 2024-03-20 LAB — BAYER DCA HB A1C WAIVED: HB A1C (BAYER DCA - WAIVED): 5.3 % (ref 4.8–5.6)

## 2024-03-20 MED ORDER — TIRZEPATIDE 10 MG/0.5ML ~~LOC~~ SOAJ
10.0000 mg | SUBCUTANEOUS | 2 refills | Status: AC
  Filled 2024-03-20: qty 6, 84d supply, fill #0
  Filled 2024-06-07: qty 6, 84d supply, fill #1
  Filled 2024-08-30: qty 6, 84d supply, fill #2

## 2024-03-20 NOTE — Progress Notes (Signed)
 Subjective:    Patient ID: Danielle Ellis, female    DOB: 05/24/1956, 68 y.o.   MRN: [id number]  Chief Complaint  Patient presents with   Medical Management of Chronic Issues    F/u mounjaro     Pt present to the office today CPE and chronic follow up.   She is followed by Cardiologists every 6 months  for NSTEMI on 10/09/20.   She is followed by GI for fatty liver. Recommended her Hep A and Hep B immunity be checked.    She was started Ozempic  2 mg. She had lost 21 lbs. She is morbid obese with a BMI of 39 with HTN and DM. We changed this to Mounjaro  7.5. Her weight is stable.  Her starting 228 lbs.      03/20/2024    8:19 AM 02/16/2024   11:40 AM 02/08/2024    3:41 PM  Last 3 Weights  Weight (lbs) 207 lb 207 lb 9.6 oz 206 lb  Weight (kg) 93.895 kg 94.167 kg 93.441 kg     Hypertension This is a chronic problem. The current episode started more than 1 year ago. The problem has been resolved since onset. The problem is controlled. Associated symptoms include anxiety. Pertinent negatives include no blurred vision, malaise/fatigue, peripheral edema or shortness of breath. Risk factors for coronary artery disease include dyslipidemia, diabetes mellitus, obesity and sedentary lifestyle. The current treatment provides moderate improvement.  Diabetes She presents for her follow-up diabetic visit. She has type 2 diabetes mellitus. Hypoglycemia symptoms include nervousness/anxiousness. Pertinent negatives for diabetes include no blurred vision, no fatigue and no foot paresthesias. Symptoms are stable. Risk factors for coronary artery disease include dyslipidemia, diabetes mellitus, hypertension and sedentary lifestyle. She is following a generally healthy diet. Her overall blood glucose range is 90-110 mg/dl. Eye exam is current.  Hyperlipidemia This is a chronic problem. The current episode started more than 1 year ago. The problem is controlled. Recent lipid tests were reviewed and are  normal. Exacerbating diseases include obesity. Pertinent negatives include no shortness of breath. Current antihyperlipidemic treatment includes statins. The current treatment provides moderate improvement of lipids. Risk factors for coronary artery disease include diabetes mellitus, hypertension, a sedentary lifestyle, post-menopausal, dyslipidemia and obesity.  Depression        This is a chronic problem.  The current episode started more than 1 year ago.   The problem occurs intermittently.  Associated symptoms include no fatigue, no helplessness, no hopelessness and not sad.  Past treatments include SSRIs - Selective serotonin reuptake inhibitors.  Past medical history includes anxiety.   Anxiety Presents for follow-up visit. Symptoms include excessive worry and nervous/anxious behavior. Patient reports no hyperventilation or shortness of breath. Symptoms occur occasionally. The severity of symptoms is mild.        Review of Systems  Constitutional:  Negative for fatigue and malaise/fatigue.  Eyes:  Negative for blurred vision.  Respiratory:  Negative for shortness of breath.   Psychiatric/Behavioral:  The patient is nervous/anxious.   All other systems reviewed and are negative.  Family History  Problem Relation Age of Onset   Diabetes Mother    Kidney disease Mother    Cancer Mother    Heart attack Mother    Depression Mother    Heart disease Mother    Hyperlipidemia Mother    Hypertension Mother    Obesity Mother    Stroke Mother    Social History   Socioeconomic History   Marital status: Married  Spouse name: Not on file   Number of children: 1   Years of education: Not on file   Highest education level: 12th grade  Occupational History   Occupation: part time Therapist, sports    Comment: retired  Tobacco Use   Smoking status: Never   Smokeless tobacco: Never  Vaping Use   Vaping status: Never Used  Substance and Sexual Activity   Alcohol use: No   Drug  use: No   Sexual activity: Yes    Partners: Male  Other Topics Concern   Not on file  Social History Narrative   Lives with her husband. They have one daughter, Hunter - She lives 10 minutes away.   Social Drivers of Corporate investment banker Strain: Low Risk  (02/15/2024)   Overall Financial Resource Strain (CARDIA)    Difficulty of Paying Living Expenses: Not very hard  Food Insecurity: No Food Insecurity (02/15/2024)   Hunger Vital Sign    Worried About Running Out of Food in the Last Year: Never true    Ran Out of Food in the Last Year: Never true  Transportation Needs: No Transportation Needs (02/15/2024)   PRAPARE - Administrator, Civil Service (Medical): No    Lack of Transportation (Non-Medical): No  Physical Activity: Insufficiently Active (02/15/2024)   Exercise Vital Sign    Days of Exercise per Week: 4 days    Minutes of Exercise per Session: 20 min  Stress: No Stress Concern Present (02/15/2024)   [employer] of Occupational Health - Occupational Stress Questionnaire    Feeling of Stress: Only a little  Social Connections: Moderately Integrated (02/15/2024)   Social Connection and Isolation Panel    Frequency of Communication with Friends and Family: More than three times a week    Frequency of Social Gatherings with Friends and Family: More than three times a week    Attends Religious Services: More than 4 times per year    Active Member of [employer] or Organizations: No    Attends Engineer, structural: Not on file    Marital Status: Married  Recent Concern: Social Connections - Moderately Isolated (12/15/2023)   Social Connection and Isolation Panel    Frequency of Communication with Friends and Family: More than three times a week    Frequency of Social Gatherings with Friends and Family: More than three times a week    Attends Religious Services: Never    Database administrator or Organizations: No    Attends Banker Meetings: Never     Marital Status: Married       Objective:   Physical Exam Vitals reviewed.  Constitutional:      General: She is not in acute distress.    Appearance: She is well-developed. She is obese.  HENT:     Head: Normocephalic and atraumatic.     Right Ear: Tympanic membrane normal.     Left Ear: Tympanic membrane normal.  Eyes:     Pupils: Pupils are equal, round, and reactive to light.  Neck:     Thyroid : No thyromegaly.  Cardiovascular:     Rate and Rhythm: Normal rate and regular rhythm.     Heart sounds: Normal heart sounds. No murmur heard. Pulmonary:     Effort: Pulmonary effort is normal. No respiratory distress.     Breath sounds: Normal breath sounds. No wheezing.  Abdominal:     General: Bowel sounds are normal. There is no distension.  Palpations: Abdomen is soft.     Tenderness: There is no abdominal tenderness.  Musculoskeletal:        General: No tenderness. Normal range of motion.     Cervical back: Normal range of motion and neck supple.  Skin:    General: Skin is warm and dry.  Neurological:     Mental Status: She is alert and oriented to person, place, and time.     Cranial Nerves: No cranial nerve deficit.     Deep Tendon Reflexes: Reflexes are normal and symmetric.  Psychiatric:        Behavior: Behavior normal.        Thought Content: Thought content normal.        Judgment: Judgment normal.       BP 106/67   Pulse 67   Temp 98 F (36.7 C)   Ht 5' 1 (1.549 m)   Wt 207 lb (93.9 kg)   SpO2 95%   BMI 39.11 kg/m      Assessment & Plan:  KAYELYN LEMON comes in today with chief complaint of Medical Management of Chronic Issues (F/u mounjaro )   Diagnosis and orders addressed:  1. Annual physical exam (Primary) - Bayer DCA Hb A1c Waived - CMP14+EGFR - CBC with Differential/Platelet - Lipid panel - Microalbumin / creatinine urine ratio - TSH - Vitamin B12  2. Hypertension, unspecified type - CMP14+EGFR - CBC with  Differential/Platelet  3. Mixed hyperlipidemia - CMP14+EGFR - CBC with Differential/Platelet - Lipid panel  4. Depression, major, single episode, mild (HCC) - CMP14+EGFR - CBC with Differential/Platelet  5. GAD (generalized anxiety disorder) - CMP14+EGFR - CBC with Differential/Platelet  6. S/P CABG x 5 - CMP14+EGFR - CBC with Differential/Platelet  7. Coronary artery disease involving native coronary artery of native heart without angina pectoris - CMP14+EGFR - CBC with Differential/Platelet  8. Morbid obesity (HCC)  - CMP14+EGFR - CBC with Differential/Platelet  9. History of non-ST elevation myocardial infarction (NSTEMI) - CMP14+EGFR - CBC with Differential/Platelet  10. NASH (nonalcoholic steatohepatitis) - CMP14+EGFR - CBC with Differential/Platelet  11. 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  Dispense: 6 mL; Refill: 2  Labs pending Continue current medications  Will change  Mounjaro  7.5 mg to 10 mg Continue current medications  Health Maintenance reviewed- Shingles vaccine given today Diet and exercise encouraged  Follow up plan: 3 months   Bari Learn, FNP

## 2024-03-20 NOTE — Patient Instructions (Signed)

## 2024-03-20 NOTE — Addendum Note (Signed)
 Addended by: MILAS KENT D on: 03/20/2024 09:29 AM   Modules accepted: Orders

## 2024-03-21 LAB — MICROALBUMIN / CREATININE URINE RATIO
Creatinine, Urine: 161.3 mg/dL
Microalb/Creat Ratio: <2 mg/g{creat} (ref 0–29)
Microalbumin, Urine: <3 ug/mL

## 2024-03-21 LAB — CMP14+EGFR
ALT: 58 IU/L — AB (ref 0–32)
AST: 36 IU/L (ref 0–40)
Albumin: 4.5 g/dL (ref 3.9–4.9)
Alkaline Phosphatase: 151 IU/L — AB (ref 44–121)
BUN/Creatinine Ratio: 21 (ref 12–28)
BUN: 20 mg/dL (ref 8–27)
Bilirubin Total: 0.5 mg/dL (ref 0.0–1.2)
CO2: 19 mmol/L — AB (ref 20–29)
Calcium: 9.2 mg/dL (ref 8.7–10.3)
Chloride: 104 mmol/L (ref 96–106)
Creatinine, Ser: 0.97 mg/dL (ref 0.57–1.00)
Globulin, Total: 2.1 g/dL (ref 1.5–4.5)
Glucose: 91 mg/dL (ref 70–99)
Potassium: 4.2 mmol/L (ref 3.5–5.2)
Sodium: 141 mmol/L (ref 134–144)
Total Protein: 6.6 g/dL (ref 6.0–8.5)
eGFR: 63 mL/min/1.73 (ref >59)

## 2024-03-21 LAB — CBC WITH DIFFERENTIAL/PLATELET
Basophils Absolute: 0 x10E3/uL (ref 0.0–0.2)
Basos: 1 %
EOS (ABSOLUTE): 0.1 x10E3/uL (ref 0.0–0.4)
Eos: 2 %
Hematocrit: 42.9 % (ref 34.0–46.6)
Hemoglobin: 13.8 g/dL (ref 11.1–15.9)
Immature Grans (Abs): 0 x10E3/uL (ref 0.0–0.1)
Immature Granulocytes: 0 %
Lymphocytes Absolute: 2.1 x10E3/uL (ref 0.7–3.1)
Lymphs: 39 %
MCH: 28.9 pg (ref 26.6–33.0)
MCHC: 32.2 g/dL (ref 31.5–35.7)
MCV: 90 fL (ref 79–97)
Monocytes Absolute: 0.5 x10E3/uL (ref 0.1–0.9)
Monocytes: 10 %
Neutrophils Absolute: 2.7 x10E3/uL (ref 1.4–7.0)
Neutrophils: 48 %
Platelets: 191 x10E3/uL (ref 150–450)
RBC: 4.78 x10E6/uL (ref 3.77–5.28)
RDW: 12.9 % (ref 11.7–15.4)
WBC: 5.5 x10E3/uL (ref 3.4–10.8)

## 2024-03-21 LAB — LIPID PANEL
Cholesterol, Total: 125 mg/dL (ref 100–199)
HDL: 39 mg/dL — AB (ref >39)
LDL CALC COMMENT:: 3.2 ratio (ref 0.0–4.4)
LDL Chol Calc (NIH): 56 mg/dL (ref 0–99)
Triglycerides: 181 mg/dL — AB (ref 0–149)
VLDL Cholesterol Cal: 30 mg/dL (ref 5–40)

## 2024-03-21 LAB — TSH: TSH: 3.38 u[IU]/mL (ref 0.450–4.500)

## 2024-03-21 LAB — VITAMIN B12: Vitamin B-12: 290 pg/mL (ref 232–1245)

## 2024-03-22 ENCOUNTER — Ambulatory Visit: Payer: Self-pay | Admitting: Family

## 2024-05-02 ENCOUNTER — Other Ambulatory Visit: Payer: Self-pay | Admitting: Family

## 2024-05-02 DIAGNOSIS — Z1231 Encounter for screening mammogram for malignant neoplasm of breast: Secondary | ICD-10-CM

## 2024-05-09 DIAGNOSIS — H5213 Myopia, bilateral: Secondary | ICD-10-CM | POA: Diagnosis not present

## 2024-05-09 DIAGNOSIS — G4733 Obstructive sleep apnea (adult) (pediatric): Secondary | ICD-10-CM | POA: Diagnosis not present

## 2024-05-09 DIAGNOSIS — H2513 Age-related nuclear cataract, bilateral: Secondary | ICD-10-CM | POA: Diagnosis not present

## 2024-05-09 DIAGNOSIS — H401134 Primary open-angle glaucoma, bilateral, indeterminate stage: Secondary | ICD-10-CM | POA: Diagnosis not present

## 2024-05-29 ENCOUNTER — Other Ambulatory Visit (HOSPITAL_COMMUNITY): Payer: Self-pay

## 2024-05-30 ENCOUNTER — Encounter (INDEPENDENT_AMBULATORY_CARE_PROVIDER_SITE_OTHER): Payer: Self-pay | Admitting: Gastroenterology

## 2024-05-30 DIAGNOSIS — H5213 Myopia, bilateral: Secondary | ICD-10-CM | POA: Diagnosis not present

## 2024-05-30 DIAGNOSIS — H2513 Age-related nuclear cataract, bilateral: Secondary | ICD-10-CM | POA: Diagnosis not present

## 2024-05-30 DIAGNOSIS — H401131 Primary open-angle glaucoma, bilateral, mild stage: Secondary | ICD-10-CM | POA: Diagnosis not present

## 2024-05-30 NOTE — Progress Notes (Signed)
 Cardiology Office Note   Date:  06/04/2024   ID:  Danielle Ellis, DOB 1956-02-14, MRN [id number]  PCP:  Lavell Bari LABOR, FNP  Cardiologist:   Jerlene Rockers [place], MD   Chief Complaint  Patient presents with   Coronary Artery Disease    History of Present Illness: Danielle Ellis is a 68 y.o. female who presents for follow up CAD. She has a a PMH of hypertension, coronary artery disease status post NSTEMI 10/09/2020, type 2 diabetes, hyperlipidemia, generalized anxiety disorder, CABG x5 on 10/14/2020 (LIMA-LAD, SVG-diagonal, SVG-OM1/OM2, and SVG-PDA)   She was admitted to the hospital on 10/10/2020.  She reported several week history of recurrent episodes of substernal chest pain that would radiate to her left shoulder and left hand.  She also noted some associated shortness of breath diaphoresis and nausea.  She was noted to have mildly elevated troponins and her EKG showed diffuse ST depressions  with new T wave inversions inferiorly.  Echocardiogram showed normal LVEF with no significant valvular abnormalities.  She underwent cardiac catheterization which showed severe multivessel disease.  She underwent CABG x5 on 10/14/2020.   She maintained sinus rhythm.  Due to her NSTEMI she was placed on Plavix  and her aspirin  was decreased to 81 mg daily.   She was discharged in stable condition on 10/19/2020. When seen in March it was noted she had discontinued CPAP therapy several years before and sleep study was ordered. This was performed and BIPAP was recommended with follow up in sleep clinic.   She was seen back in September and was on BIPAP with significant improvement in symptoms. Was referred to lipid clinic since not at goal on maximally tolerated statin. PCSK 9 inhibitor was started. Tolerating this well.   On follow up today she is doing well. No chest pain or dyspnea. Using CPAP. Frustrated that she can't lose weight. Switched from Ozempic  to Mounjaro . She walks daily and is still working 3  days/week.     Past Medical History:  Diagnosis Date   Anxiety    Depression    Hyperlipidemia    Hypertension    Sleep apnea     Past Surgical History:  Procedure Laterality Date   APPENDECTOMY     CORONARY ARTERY BYPASS GRAFT N/A 10/14/2020   Procedure: CORONARY ARTERY BYPASS GRAFTING (CABG) X FIVE, USING LEFT INTERNAL MAMMARY ARTERY AND BILATERAL LEGS GREATER SAPHENOUS VEINS HARVESTED ENDOSCOPICALLY;  Surgeon: Lucas Dorise POUR, MD;  Location: [facility] OR;  Service: Open Heart Surgery;  Laterality: N/A;   L ankle surgery     LEFT HEART CATH AND CORONARY ANGIOGRAPHY N/A 10/10/2020   Procedure: LEFT HEART CATH AND CORONARY ANGIOGRAPHY;  Surgeon: [place], Shields Pautz M, MD;  Location: [facility] INVASIVE CV LAB;  Service: Cardiovascular;  Laterality: N/A;   TEE WITHOUT CARDIOVERSION N/A 10/14/2020   Procedure: TRANSESOPHAGEAL ECHOCARDIOGRAM (TEE);  Surgeon: Lucas Dorise POUR, MD;  Location: [facility] OR;  Service: Open Heart Surgery;  Laterality: N/A;     Current Outpatient Medications  Medication Sig Dispense Refill   baclofen  (LIORESAL ) 10 MG tablet Take 1 tablet (10 mg total) by mouth 3 (three) times daily. 30 each 0   blood glucose meter kit and supplies Dispense based on patient and insurance preference. Use up to four times daily as directed. (FOR ICD-10 E10.9, E11.9). 1 each 0   escitalopram  (LEXAPRO ) 5 MG tablet Take 1 tablet (5 mg total) by mouth daily. 30 tablet 1   Evolocumab  (REPATHA  SURECLICK) 140 MG/ML SOAJ INJECT  140 MG INTO THE SKIN EVERY 14 (FOURTEEN) DAYS. 6 mL 3   fluticasone  (FLONASE ) 50 MCG/ACT nasal spray Place 1 spray into both nostrils daily. 48 g 1   glucose blood (ONETOUCH ULTRA) test strip Test blood sugar 4 times daily as directed. 400 strip 3   latanoprost  (XALATAN ) 0.005 % ophthalmic solution Place 1 drop into both eyes daily 2.5 mL 6   tirzepatide  (MOUNJARO ) 10 MG/0.5ML Pen Inject 10 mg into the skin once a week. 6 mL 2   amLODipine  (NORVASC ) 5 MG tablet Take 1 tablet (5 mg total) by  mouth daily. 90 tablet 3   losartan  (COZAAR ) 100 MG tablet Take 1 tablet (100 mg total) by mouth daily. 90 tablet 3   metoprolol  succinate (TOPROL -XL) 100 MG 24 hr tablet Take 1 tablet (100 mg total) by mouth daily. Take with or immediately following a meal. 90 tablet 3   rosuvastatin  (CRESTOR ) 10 MG tablet Take 0.5 tablets (5 mg total) by mouth daily. 45 tablet 3   No current facility-administered medications for this visit.    Allergies:   Patient has no known allergies.    Social History:  The patient  reports that she has never smoked. She has never used smokeless tobacco. She reports that she does not drink alcohol and does not use drugs.   Family History:  The patient's family history includes Cancer in her mother; Depression in her mother; Diabetes in her mother; Heart attack in her mother; Heart disease in her mother; Hyperlipidemia in her mother; Hypertension in her mother; Kidney disease in her mother; Obesity in her mother; Stroke in her mother.    ROS:  Please see the history of present illness.   Otherwise, review of systems are positive for none.   All other systems are reviewed and negative.    PHYSICAL EXAM: VS:  BP 137/79 (BP Location: Left Arm, Patient Position: Sitting, Cuff Size: Normal)   Pulse 85   Ht 5' 1 (1.549 m)   Wt 209 lb 14.4 oz (95.2 kg)   SpO2 98%   BMI 39.66 kg/m  , BMI Body mass index is 39.66 kg/m. GEN: Well nourished, obese, in no acute distress  HEENT: normal  Neck: no JVD, carotid bruits, or masses Cardiac: RRR; no murmurs, rubs, or gallops,no edema. Sternal incision has healed well.  Respiratory:  clear to auscultation bilaterally, normal work of breathing GI: soft, nontender, nondistended, + BS MS: no deformity or atrophy  Skin: warm and dry, no rash Neuro:  Strength and sensation are intact Psych: euthymic mood, full affect   EKG Interpretation Date/Time:  Monday June 04 2024 09:59:15 EDT Ventricular Rate:  80 PR  Interval:  174 QRS Duration:  80 QT Interval:  376 QTC Calculation: 433 R Axis:   16  Text Interpretation: Normal sinus rhythm Septal infarct (cited on or before 07-Jul-2023) When compared with ECG of 07-Jul-2023 08:46, No significant change was found Confirmed by [place], Jhoan Schmieder [phone]) on 06/04/2024 10:01:03 AM    Recent Labs: 03/20/2024: ALT 58; BUN 20; Creatinine, Ser 0.97; Hemoglobin 13.8; Platelets 191; Potassium 4.2; Sodium 141; TSH 3.380    Lipid Panel    Component Value Date/Time   CHOL 125 03/20/2024 0924   TRIG 181 (H) 03/20/2024 0924   HDL 39 (L) 03/20/2024 0924   CHOLHDL 3.2 03/20/2024 0924   CHOLHDL 8.2 10/10/2020 0144   VLDL 57 (H) 10/10/2020 0144   LDLCALC 56 03/20/2024 0924      Wt Readings from Last  3 Encounters:  06/04/24 209 lb 14.4 oz (95.2 kg)  03/20/24 207 lb (93.9 kg)  02/16/24 207 lb 9.6 oz (94.2 kg)      Other studies Reviewed: Additional studies/ records that were reviewed today include: sleep study as noted in chart.    ASSESSMENT AND PLAN:  1.  Coronary artery disease-presented with NSTEMI. Found to have multivessel CAD. Status post CABG x5 October 14, 2020 Continue ASA, Toprol , statin.  Heart healthy diet. Low carb Remain active Encourage weight loss.    2. Essential hypertension-BP is well controlled  On losartan  and Toprol  XL.  On Amlodipine .     3. Hyperlipidemia- intolerant to lipitor and pravastatin  in the past. On low dose  Crestor  5mg  daily.  Now on Repatha .  Excellent response with LDL down to 56.    4. OSA/snoring/daytime somnolence -abnormal sleep study. Now on BIPAP. Follow up with Dr Shlomo   5. Type 2 diabetes-A1c 5.3%.  Continue  Mounjaro  Heart healthy low-sodium carb modified diet Per primary care.       Disposition:   FU with me in one year   Signed, Reba Hulett [place], MD  06/04/2024 10:09 AM    [facility] [address], [place], [place], [address] Phone [phone], Fax [phone]

## 2024-06-04 ENCOUNTER — Encounter: Payer: Self-pay | Admitting: Cardiology

## 2024-06-04 ENCOUNTER — Ambulatory Visit
Admission: RE | Admit: 2024-06-04 | Discharge: 2024-06-04 | Disposition: A | Discharge status: Home / Self Care | Source: Ambulatory Visit | Attending: Family | Admitting: Family

## 2024-06-04 ENCOUNTER — Other Ambulatory Visit (HOSPITAL_COMMUNITY): Payer: Self-pay

## 2024-06-04 ENCOUNTER — Ambulatory Visit: Attending: Cardiology | Admitting: Cardiology

## 2024-06-04 VITALS — BP 137/79 | HR 85 | Ht 61.0 in | Wt 209.9 lb

## 2024-06-04 DIAGNOSIS — Z951 Presence of aortocoronary bypass graft: Secondary | ICD-10-CM | POA: Diagnosis not present

## 2024-06-04 DIAGNOSIS — I2510 Atherosclerotic heart disease of native coronary artery without angina pectoris: Secondary | ICD-10-CM

## 2024-06-04 DIAGNOSIS — G4733 Obstructive sleep apnea (adult) (pediatric): Secondary | ICD-10-CM | POA: Diagnosis not present

## 2024-06-04 DIAGNOSIS — I10 Essential (primary) hypertension: Secondary | ICD-10-CM

## 2024-06-04 DIAGNOSIS — Z1231 Encounter for screening mammogram for malignant neoplasm of breast: Secondary | ICD-10-CM | POA: Diagnosis not present

## 2024-06-04 DIAGNOSIS — E782 Mixed hyperlipidemia: Secondary | ICD-10-CM

## 2024-06-04 MED ORDER — LOSARTAN POTASSIUM 100 MG PO TABS
100.0000 mg | ORAL_TABLET | Freq: Every day | ORAL | 3 refills | Status: AC
  Filled 2024-06-04 – 2024-08-23 (×2): qty 90, 90d supply, fill #0

## 2024-06-04 MED ORDER — AMLODIPINE BESYLATE 5 MG PO TABS
5.0000 mg | ORAL_TABLET | Freq: Every day | ORAL | 3 refills | Status: AC
  Filled 2024-06-04 – 2024-08-22 (×2): qty 90, 90d supply, fill #0

## 2024-06-04 MED ORDER — ROSUVASTATIN CALCIUM 10 MG PO TABS
5.0000 mg | ORAL_TABLET | Freq: Every day | ORAL | 3 refills | Status: AC
  Filled 2024-06-04 – 2024-08-23 (×2): qty 45, 90d supply, fill #0

## 2024-06-04 MED ORDER — METOPROLOL SUCCINATE ER 100 MG PO TB24
100.0000 mg | ORAL_TABLET | Freq: Every day | ORAL | 3 refills | Status: AC
  Filled 2024-06-04 – 2024-08-22 (×2): qty 90, 90d supply, fill #0

## 2024-06-04 NOTE — Patient Instructions (Signed)

## 2024-06-07 ENCOUNTER — Other Ambulatory Visit: Payer: Self-pay

## 2024-06-21 ENCOUNTER — Encounter: Payer: Self-pay | Admitting: Family

## 2024-06-21 ENCOUNTER — Ambulatory Visit (INDEPENDENT_AMBULATORY_CARE_PROVIDER_SITE_OTHER): Payer: Self-pay | Admitting: Family

## 2024-06-21 VITALS — BP 139/84 | HR 73 | Temp 97.8°F | Ht 61.0 in | Wt 205.8 lb

## 2024-06-21 DIAGNOSIS — K7581 Nonalcoholic steatohepatitis (NASH): Secondary | ICD-10-CM

## 2024-06-21 DIAGNOSIS — E0849 Diabetes mellitus due to underlying condition with other diabetic neurological complication: Secondary | ICD-10-CM

## 2024-06-21 DIAGNOSIS — Z951 Presence of aortocoronary bypass graft: Secondary | ICD-10-CM | POA: Diagnosis not present

## 2024-06-21 DIAGNOSIS — F320 Major depressive disorder, single episode, mild: Secondary | ICD-10-CM

## 2024-06-21 DIAGNOSIS — I2510 Atherosclerotic heart disease of native coronary artery without angina pectoris: Secondary | ICD-10-CM | POA: Diagnosis not present

## 2024-06-21 DIAGNOSIS — I10 Essential (primary) hypertension: Secondary | ICD-10-CM | POA: Diagnosis not present

## 2024-06-21 DIAGNOSIS — Z23 Encounter for immunization: Secondary | ICD-10-CM

## 2024-06-21 DIAGNOSIS — E782 Mixed hyperlipidemia: Secondary | ICD-10-CM | POA: Diagnosis not present

## 2024-06-21 DIAGNOSIS — F411 Generalized anxiety disorder: Secondary | ICD-10-CM

## 2024-06-21 DIAGNOSIS — Z7985 Long-term (current) use of injectable non-insulin antidiabetic drugs: Secondary | ICD-10-CM

## 2024-06-21 DIAGNOSIS — I252 Old myocardial infarction: Secondary | ICD-10-CM

## 2024-06-21 NOTE — Addendum Note (Signed)
 Addended by: MICHELINE ROSINA FALCON on: 06/21/2024 02:48 PM   Modules accepted: Orders

## 2024-06-21 NOTE — Progress Notes (Signed)
 Subjective:    Patient ID: Danielle Ellis, female    DOB: [birth year], 68 y.o.   MRN: [id number]  Chief Complaint  Patient presents with   Medical Management of Chronic Issues    Pt present to the office today chronic follow up.   She is followed by Cardiologists annually  for NSTEMI on 10/09/20.   She is followed by GI for fatty liver. Recommended her Hep A and Hep B immunity be checked.    She is currently taking Mounjaro  10 mg. She had lost 23 lbs. She is morbid obese with a BMI of 38 with HTN and DM. Her starting 228 lbs.      06/21/2024    2:06 PM 06/04/2024    9:51 AM 03/20/2024    8:19 AM  Last 3 Weights  Weight (lbs) 205 lb 12.8 oz 209 lb 14.4 oz 207 lb  Weight (kg) 93.35 kg 95.21 kg 93.895 kg     Hypertension This is a chronic problem. The current episode started more than 1 year ago. The problem has been resolved since onset. The problem is controlled. Associated symptoms include anxiety. Pertinent negatives include no blurred vision, malaise/fatigue, peripheral edema or shortness of breath. Risk factors for coronary artery disease include dyslipidemia, diabetes mellitus, obesity and sedentary lifestyle. The current treatment provides moderate improvement. Hypertensive end-organ damage includes CAD/MI.  Diabetes She presents for her follow-up diabetic visit. She has type 2 diabetes mellitus. Hypoglycemia symptoms include nervousness/anxiousness. Pertinent negatives for diabetes include no blurred vision, no fatigue and no foot paresthesias. Symptoms are stable. Risk factors for coronary artery disease include dyslipidemia, diabetes mellitus, hypertension and sedentary lifestyle. She is following a generally healthy diet. Her overall blood glucose range is 90-110 mg/dl. Eye exam is current.  Hyperlipidemia This is a chronic problem. The current episode started more than 1 year ago. The problem is controlled. Recent lipid tests were reviewed and are normal. Exacerbating  diseases include obesity. Pertinent negatives include no shortness of breath. Current antihyperlipidemic treatment includes statins. The current treatment provides moderate improvement of lipids. Risk factors for coronary artery disease include diabetes mellitus, hypertension, a sedentary lifestyle, post-menopausal, dyslipidemia and obesity.  Depression        This is a chronic problem.  The current episode started more than 1 year ago.   The problem occurs intermittently.  Associated symptoms include no fatigue, no helplessness, no hopelessness and not sad.  Past treatments include nothing (Stop Lexapro ).  Past medical history includes anxiety.   Anxiety Presents for follow-up visit. Symptoms include excessive worry and nervous/anxious behavior. Patient reports no hyperventilation or shortness of breath. Symptoms occur occasionally. The severity of symptoms is mild.        Review of Systems  Constitutional:  Negative for fatigue and malaise/fatigue.  Eyes:  Negative for blurred vision.  Respiratory:  Negative for shortness of breath.   Psychiatric/Behavioral:  The patient is nervous/anxious.   All other systems reviewed and are negative.  Family History  Problem Relation Age of Onset   Diabetes Mother    Kidney disease Mother    Cancer Mother    Heart attack Mother    Depression Mother    Heart disease Mother    Hyperlipidemia Mother    Hypertension Mother    Obesity Mother    Stroke Mother    Breast cancer Neg Hx    Social History   Socioeconomic History   Marital status: Married    Spouse name: Not on file  Number of children: 1   Years of education: Not on file   Highest education level: 12th grade  Occupational History   Occupation: part time therapist, sports    Comment: retired  Tobacco Use   Smoking status: Never   Smokeless tobacco: Never  Vaping Use   Vaping status: Never Used  Substance and Sexual Activity   Alcohol use: No   Drug use: No   Sexual  activity: Yes    Partners: Male  Other Topics Concern   Not on file  Social History Narrative   Lives with her husband. They have one daughter, Hunter - She lives 10 minutes away.   Social Drivers of Corporate Investment Banker Strain: Low Risk  (02/15/2024)   Overall Financial Resource Strain (CARDIA)    Difficulty of Paying Living Expenses: Not very hard  Food Insecurity: No Food Insecurity (02/15/2024)   Hunger Vital Sign    Worried About Running Out of Food in the Last Year: Never true    Ran Out of Food in the Last Year: Never true  Transportation Needs: No Transportation Needs (02/15/2024)   PRAPARE - Administrator, Civil Service (Medical): No    Lack of Transportation (Non-Medical): No  Physical Activity: Insufficiently Active (02/15/2024)   Exercise Vital Sign    Days of Exercise per Week: 4 days    Minutes of Exercise per Session: 20 min  Stress: No Stress Concern Present (02/15/2024)   [employer] of Occupational Health - Occupational Stress Questionnaire    Feeling of Stress: Only a little  Social Connections: Moderately Integrated (02/15/2024)   Social Connection and Isolation Panel    Frequency of Communication with Friends and Family: More than three times a week    Frequency of Social Gatherings with Friends and Family: More than three times a week    Attends Religious Services: More than 4 times per year    Active Member of [employer] or Organizations: No    Attends Engineer, Structural: Not on file    Marital Status: Married  Recent Concern: Social Connections - Moderately Isolated (12/15/2023)   Social Connection and Isolation Panel    Frequency of Communication with Friends and Family: More than three times a week    Frequency of Social Gatherings with Friends and Family: More than three times a week    Attends Religious Services: Never    Database Administrator or Organizations: No    Attends Banker Meetings: Never    Marital Status:  Married       Objective:   Physical Exam Vitals reviewed.  Constitutional:      General: She is not in acute distress.    Appearance: She is well-developed. She is obese.  HENT:     Head: Normocephalic and atraumatic.     Right Ear: Tympanic membrane normal.     Left Ear: Tympanic membrane normal.  Eyes:     Pupils: Pupils are equal, round, and reactive to light.  Neck:     Thyroid : No thyromegaly.  Cardiovascular:     Rate and Rhythm: Normal rate and regular rhythm.     Heart sounds: Normal heart sounds. No murmur heard. Pulmonary:     Effort: Pulmonary effort is normal. No respiratory distress.     Breath sounds: Normal breath sounds. No wheezing.  Abdominal:     General: Bowel sounds are normal. There is no distension.     Palpations: Abdomen is soft.  Tenderness: There is no abdominal tenderness.  Musculoskeletal:        General: No tenderness. Normal range of motion.     Cervical back: Normal range of motion and neck supple.  Skin:    General: Skin is warm and dry.  Neurological:     Mental Status: She is alert and oriented to person, place, and time.     Cranial Nerves: No cranial nerve deficit.     Deep Tendon Reflexes: Reflexes are normal and symmetric.  Psychiatric:        Behavior: Behavior normal.        Thought Content: Thought content normal.        Judgment: Judgment normal.       BP 139/84   Pulse 73   Temp 97.8 F (36.6 C) (Temporal)   Ht 5' 1 (1.549 m)   Wt 205 lb 12.8 oz (93.4 kg)   SpO2 94%   BMI 38.89 kg/m      Assessment & Plan:  ANNELYSE REY comes in today with chief complaint of Medical Management of Chronic Issues   Diagnosis and orders addressed:  1. Morbid obesity (HCC) - CMP14+EGFR  2. NASH (nonalcoholic steatohepatitis) - CMP14+EGFR  3. Hypertension, unspecified type - CMP14+EGFR  4. Mixed hyperlipidemia - CMP14+EGFR  5. S/P CABG x 5  - CMP14+EGFR  6. History of non-ST elevation myocardial infarction  (NSTEMI)  - CMP14+EGFR  7. GAD (generalized anxiety disorder) - CMP14+EGFR  8. Depression, major, single episode, mild  - CMP14+EGFR  9. Coronary artery disease involving native coronary artery of native heart without angina pectoris  - CMP14+EGFR  10. Diabetes due to undrl condition w oth diabetic neuro comp (HCC) (Primary - CMP14+EGFR   Labs pending Continue current medications  Will continue  Mounjaro  10 mg Continue current medications  Health Maintenance reviewed- Second shingles vaccine given today Diet and exercise encouraged  Follow up plan: 3 months   Bari Learn, FNP

## 2024-06-21 NOTE — Patient Instructions (Signed)

## 2024-06-22 ENCOUNTER — Ambulatory Visit: Payer: Self-pay | Admitting: Family

## 2024-06-22 LAB — CMP14+EGFR
ALT: 36 IU/L — ABNORMAL HIGH (ref 0–32)
AST: 28 IU/L (ref 0–40)
Albumin: 4.5 g/dL (ref 3.9–4.9)
Alkaline Phosphatase: 138 IU/L — ABNORMAL HIGH (ref 49–135)
BUN/Creatinine Ratio: 14 (ref 12–28)
BUN: 15 mg/dL (ref 8–27)
Bilirubin Total: 0.4 mg/dL (ref 0.0–1.2)
CO2: 21 mmol/L (ref 20–29)
Calcium: 9.6 mg/dL (ref 8.7–10.3)
Chloride: 105 mmol/L (ref 96–106)
Creatinine, Ser: 1.08 mg/dL — ABNORMAL HIGH (ref 0.57–1.00)
Globulin, Total: 2.6 g/dL (ref 1.5–4.5)
Glucose: 81 mg/dL (ref 70–99)
Potassium: 4.3 mmol/L (ref 3.5–5.2)
Sodium: 142 mmol/L (ref 134–144)
Total Protein: 7.1 g/dL (ref 6.0–8.5)
eGFR: 55 mL/min/1.73 — ABNORMAL LOW (ref >59)

## 2024-07-06 ENCOUNTER — Telehealth: Payer: Self-pay

## 2024-07-09 ENCOUNTER — Ambulatory Visit: Attending: Cardiovascular Disease | Admitting: Cardiology

## 2024-07-09 ENCOUNTER — Encounter: Payer: Self-pay | Admitting: Cardiology

## 2024-07-09 VITALS — BP 132/72 | HR 71 | Ht 61.5 in | Wt 206.0 lb

## 2024-07-09 DIAGNOSIS — G4733 Obstructive sleep apnea (adult) (pediatric): Secondary | ICD-10-CM | POA: Diagnosis not present

## 2024-07-09 DIAGNOSIS — I10 Essential (primary) hypertension: Secondary | ICD-10-CM

## 2024-07-09 NOTE — Patient Instructions (Signed)

## 2024-07-09 NOTE — Progress Notes (Signed)
 SLEEP MEDICINE OFFICE VISIT NOTE:   Date:  07/09/2024   ID:  KEISHAWN Ellis, DOB [birth year], MRN [id number]  PCP:  Lavell Bari LABOR, FNP  Cardiologist:  Peter Jordan, MD    Referring MD: Lavell Bari LABOR, FNP   Chief Complaint  Patient presents with   Sleep Apnea   Hypertension    History of Present Illness:    Danielle Ellis is a 68 y.o. female with a hx of retention, type 2 diabetes mellitus, lipidemia, CAD and obstructive sleep apnea on CPAP.  She initially was followed by Dr. Burnard for her sleep apnea but upon his retirement is now referred for me to follow.  She initially had a sleep study May 2022 and showed severe obstructive sleep apnea with an AHI of 30.7/h and severe O2 desaturations as low as 77%.  She was started on CPAP which was suboptimal in controlling her events and underwent BiPAP titration in June 2022 and was started on BiPAP at 16 over 12 cm H2O.  Her DME is [employer].  She is doing well with her PAP device and thinks that she has gotten used to it.  She she tolerates the under the nose full face mask and feels the pressure is adequate.  Since going on PAP she feels rested in the am and has no significant daytime sleepiness.  She denies any significant mouth or nasal dryness or nasal congestion.  She does not think that he snores. An Epworth Sleepiness Scale score was calculated the office today and this endorsed at 4 arguing against residual daytime sleepiness. Patient denies any episodes of bruxism, restless legs, No gagging hallucinations or cataplectic events.    Past Medical History:  Diagnosis Date   Anxiety    Depression    Hyperlipidemia    Hypertension    Sleep apnea     Past Surgical History:  Procedure Laterality Date   APPENDECTOMY     CORONARY ARTERY BYPASS GRAFT N/A 10/14/2020   Procedure: CORONARY ARTERY BYPASS GRAFTING (CABG) X FIVE, USING LEFT INTERNAL MAMMARY ARTERY AND BILATERAL LEGS GREATER SAPHENOUS VEINS HARVESTED  ENDOSCOPICALLY;  Surgeon: Lucas Dorise POUR, MD;  Location: [facility] OR;  Service: Open Heart Surgery;  Laterality: N/A;   L ankle surgery     LEFT HEART CATH AND CORONARY ANGIOGRAPHY N/A 10/10/2020   Procedure: LEFT HEART CATH AND CORONARY ANGIOGRAPHY;  Surgeon: Jordan, Peter M, MD;  Location: [facility] INVASIVE CV LAB;  Service: Cardiovascular;  Laterality: N/A;   TEE WITHOUT CARDIOVERSION N/A 10/14/2020   Procedure: TRANSESOPHAGEAL ECHOCARDIOGRAM (TEE);  Surgeon: Lucas Dorise POUR, MD;  Location: [facility] OR;  Service: Open Heart Surgery;  Laterality: N/A;    Current Medications: Current Meds  Medication Sig   amLODipine  (NORVASC ) 5 MG tablet Take 1 tablet (5 mg total) by mouth daily.   baclofen  (LIORESAL ) 10 MG tablet Take 1 tablet (10 mg total) by mouth 3 (three) times daily.   blood glucose meter kit and supplies Dispense based on patient and insurance preference. Use up to four times daily as directed. (FOR ICD-10 E10.9, E11.9).   Evolocumab  (REPATHA  SURECLICK) 140 MG/ML SOAJ INJECT 140 MG INTO THE SKIN EVERY 14 (FOURTEEN) DAYS.   fluticasone  (FLONASE ) 50 MCG/ACT nasal spray Place 1 spray into both nostrils daily.   glucose blood (ONETOUCH ULTRA) test strip Test blood sugar 4 times daily as directed.   latanoprost  (XALATAN ) 0.005 % ophthalmic solution Place 1 drop into both eyes daily   losartan  (COZAAR ) 100  MG tablet Take 1 tablet (100 mg total) by mouth daily.   metoprolol  succinate (TOPROL -XL) 100 MG 24 hr tablet Take 1 tablet (100 mg total) by mouth daily. Take with or immediately following a meal.   rosuvastatin  (CRESTOR ) 10 MG tablet Take 0.5 tablets (5 mg total) by mouth daily.   tirzepatide  (MOUNJARO ) 10 MG/0.5ML Pen Inject 10 mg into the skin once a week.     Allergies:   Patient has no known allergies.   Social History   Socioeconomic History   Marital status: Married    Spouse name: Not on file   Number of children: 1   Years of education: Not on file   Highest education level: 12th grade   Occupational History   Occupation: part time therapist, sports    Comment: retired  Tobacco Use   Smoking status: Never   Smokeless tobacco: Never  Vaping Use   Vaping status: Never Used  Substance and Sexual Activity   Alcohol use: No   Drug use: No   Sexual activity: Yes    Partners: Male  Other Topics Concern   Not on file  Social History Narrative   Lives with her husband. They have one daughter, Danielle Ellis - She lives 10 minutes away.   Social Drivers of Corporate Investment Banker Strain: Low Risk  (02/15/2024)   Overall Financial Resource Strain (CARDIA)    Difficulty of Paying Living Expenses: Not very hard  Food Insecurity: No Food Insecurity (02/15/2024)   Hunger Vital Sign    Worried About Running Out of Food in the Last Year: Never true    Ran Out of Food in the Last Year: Never true  Transportation Needs: No Transportation Needs (02/15/2024)   PRAPARE - Administrator, Civil Service (Medical): No    Lack of Transportation (Non-Medical): No  Physical Activity: Insufficiently Active (02/15/2024)   Exercise Vital Sign    Days of Exercise per Week: 4 days    Minutes of Exercise per Session: 20 min  Stress: No Stress Concern Present (02/15/2024)   [employer] of Occupational Health - Occupational Stress Questionnaire    Feeling of Stress: Only a little  Social Connections: Moderately Integrated (02/15/2024)   Social Connection and Isolation Panel    Frequency of Communication with Friends and Family: More than three times a week    Frequency of Social Gatherings with Friends and Family: More than three times a week    Attends Religious Services: More than 4 times per year    Active Member of [employer] or Organizations: No    Attends Engineer, Structural: Not on file    Marital Status: Married  Recent Concern: Social Connections - Moderately Isolated (12/15/2023)   Social Connection and Isolation Panel    Frequency of Communication with Friends and Family:  More than three times a week    Frequency of Social Gatherings with Friends and Family: More than three times a week    Attends Religious Services: Never    Database Administrator or Organizations: No    Attends Engineer, Structural: Never    Marital Status: Married     Family History: The patient's family history includes Cancer in her mother; Depression in her mother; Diabetes in her mother; Heart attack in her mother; Heart disease in her mother; Hyperlipidemia in her mother; Hypertension in her mother; Kidney disease in her mother; Obesity in her mother; Stroke in her mother. There is no history  of Breast cancer.  ROS:   Please see the history of present illness.    ROS  All other systems reviewed and negative.   EKGs/Labs/Other Studies Reviewed:    The following studies were reviewed today: PAP compliance download  Physical Exam:    VS:  BP 132/72   Pulse 71   Ht 5' 1.5 (1.562 m)   Wt 206 lb (93.4 kg)   SpO2 97%   BMI 38.29 kg/m     Wt Readings from Last 3 Encounters:  07/09/24 206 lb (93.4 kg)  06/21/24 205 lb 12.8 oz (93.4 kg)  06/04/24 209 lb 14.4 oz (95.2 kg)    GEN: Well nourished, well developed in no acute distress HEENT: Normal NECK: No JVD; No carotid bruits LYMPHATICS: No lymphadenopathy CARDIAC:RRR, no murmurs, rubs, gallops RESPIRATORY:  Clear to auscultation without rales, wheezing or rhonchi  ABDOMEN: Soft, non-tender, non-distended MUSCULOSKELETAL:  No edema; No deformity  SKIN: Warm and dry NEUROLOGIC:  Alert and oriented x 3 PSYCHIATRIC:  Normal affect   ASSESSMENT:    1. Obstructive sleep apnea (adult) (pediatric)   2. Hypertension, unspecified type    PLAN:    In order of problems listed above:  OSA - The patient is tolerating PAP therapy well without any problems. The PAP download performed by his DME was personally reviewed and interpreted by me today and showed an AHI of 1.4 /hr on BiPAP at 16/12 cm H2O with 67% compliance  in using more than 4 hours nightly.  The patient has been using and benefiting from PAP use and will continue to benefit from therapy.  - I encouraged her to be more compliant with her device>> she had gone to see her daughter in October and forgot to take it with her.  Hypertension - BP controlled on exam today - Continue amlodipine  5 mg daily, losartan  100 mg daily Toprol -XL 100 mg daily with as needed refills   Time Spent: 20 minutes total time of encounter, including 15 minutes spent in face-to-face patient care on the date of this encounter. This time includes coordination of care and counseling regarding above mentioned problem list. Remainder of non-face-to-face time involved reviewing chart documents/testing relevant to the patient encounter and documentation in the medical record. I have independently reviewed documentation from referring provider  Medication Adjustments/Labs and Tests Ordered: Current medicines are reviewed at length with the patient today.  Concerns regarding medicines are outlined above.  No orders of the defined types were placed in this encounter.  No orders of the defined types were placed in this encounter.   Signed, Wilbert Bihari, MD  07/09/2024 10:54 AM    [facility]

## 2024-07-10 DIAGNOSIS — G4733 Obstructive sleep apnea (adult) (pediatric): Secondary | ICD-10-CM | POA: Diagnosis not present

## 2024-07-23 ENCOUNTER — Other Ambulatory Visit: Payer: Self-pay

## 2024-08-22 ENCOUNTER — Other Ambulatory Visit (HOSPITAL_COMMUNITY): Payer: Self-pay

## 2024-08-22 ENCOUNTER — Other Ambulatory Visit: Payer: Self-pay

## 2024-08-23 ENCOUNTER — Other Ambulatory Visit: Payer: Self-pay

## 2024-08-30 ENCOUNTER — Other Ambulatory Visit: Payer: Self-pay

## 2024-09-04 ENCOUNTER — Encounter: Payer: Self-pay | Admitting: *Deleted

## 2024-09-17 ENCOUNTER — Ambulatory Visit: Admitting: Family

## 2024-09-21 ENCOUNTER — Ambulatory Visit: Admitting: Family

## 2024-09-21 ENCOUNTER — Other Ambulatory Visit (HOSPITAL_COMMUNITY): Payer: Self-pay

## 2024-12-25 ENCOUNTER — Ambulatory Visit
